# Patient Record
Sex: Female | Born: 1950
Health system: Southern US, Community
[De-identification: ages and names within clinical notes are randomized; demographics above are authoritative.]

## PROBLEM LIST (undated history)

## (undated) DIAGNOSIS — C44721 Squamous cell carcinoma of skin of unspecified lower limb, including hip: Secondary | ICD-10-CM

## (undated) DIAGNOSIS — M25512 Pain in left shoulder: Secondary | ICD-10-CM

## (undated) DIAGNOSIS — M25511 Pain in right shoulder: Secondary | ICD-10-CM

## (undated) DIAGNOSIS — R0789 Other chest pain: Secondary | ICD-10-CM

## (undated) DIAGNOSIS — F419 Anxiety disorder, unspecified: Secondary | ICD-10-CM

## (undated) DIAGNOSIS — E785 Hyperlipidemia, unspecified: Secondary | ICD-10-CM

## (undated) DIAGNOSIS — R739 Hyperglycemia, unspecified: Secondary | ICD-10-CM

## (undated) DIAGNOSIS — Z0000 Encounter for general adult medical examination without abnormal findings: Secondary | ICD-10-CM

## (undated) DIAGNOSIS — F329 Major depressive disorder, single episode, unspecified: Secondary | ICD-10-CM

## (undated) DIAGNOSIS — F32A Depression, unspecified: Secondary | ICD-10-CM

## (undated) DIAGNOSIS — F418 Other specified anxiety disorders: Secondary | ICD-10-CM

## (undated) DIAGNOSIS — M8580 Other specified disorders of bone density and structure, unspecified site: Secondary | ICD-10-CM

## (undated) DIAGNOSIS — N649 Disorder of breast, unspecified: Secondary | ICD-10-CM

## (undated) HISTORY — DX: Depression, unspecified: F32.A

## (undated) HISTORY — PX: CHOLECYSTECTOMY: SHX55

## (undated) HISTORY — DX: Other chest pain: R07.89

## (undated) HISTORY — DX: Pain in left shoulder: M25.512

## (undated) HISTORY — DX: Encounter for general adult medical examination without abnormal findings: Z00.00

## (undated) HISTORY — DX: Disorder of breast, unspecified: N64.9

## (undated) HISTORY — DX: Squamous cell carcinoma of skin of unspecified lower limb, including hip: C44.721

## (undated) HISTORY — DX: Anxiety disorder, unspecified: F41.9

## (undated) HISTORY — PX: BREAST BIOPSY: SHX20

## (undated) HISTORY — DX: Hyperglycemia, unspecified: R73.9

## (undated) HISTORY — DX: Pain in right shoulder: M25.511

## (undated) HISTORY — DX: Other specified disorders of bone density and structure, unspecified site: M85.80

## (undated) HISTORY — DX: Hyperlipidemia, unspecified: E78.5

## (undated) HISTORY — DX: Other specified anxiety disorders: F41.8

## (undated) HISTORY — PX: TUBAL LIGATION: SHX77

## (undated) HISTORY — DX: Major depressive disorder, single episode, unspecified: F32.9

---

## 2010-05-09 ENCOUNTER — Encounter: Payer: Self-pay | Admitting: Family Medicine

## 2010-05-17 ENCOUNTER — Encounter (INDEPENDENT_AMBULATORY_CARE_PROVIDER_SITE_OTHER): Payer: Self-pay | Admitting: *Deleted

## 2010-05-30 ENCOUNTER — Encounter (INDEPENDENT_AMBULATORY_CARE_PROVIDER_SITE_OTHER): Payer: Self-pay | Admitting: *Deleted

## 2010-06-03 ENCOUNTER — Ambulatory Visit: Payer: Self-pay | Admitting: Internal Medicine

## 2010-06-11 ENCOUNTER — Ambulatory Visit: Payer: Self-pay | Admitting: Internal Medicine

## 2010-06-18 ENCOUNTER — Ambulatory Visit: Payer: Self-pay | Admitting: Family Medicine

## 2010-06-18 DIAGNOSIS — K5730 Diverticulosis of large intestine without perforation or abscess without bleeding: Secondary | ICD-10-CM | POA: Insufficient documentation

## 2010-06-18 DIAGNOSIS — N649 Disorder of breast, unspecified: Secondary | ICD-10-CM

## 2010-06-18 DIAGNOSIS — G4700 Insomnia, unspecified: Secondary | ICD-10-CM | POA: Insufficient documentation

## 2010-06-18 DIAGNOSIS — K219 Gastro-esophageal reflux disease without esophagitis: Secondary | ICD-10-CM | POA: Insufficient documentation

## 2010-06-18 HISTORY — DX: Disorder of breast, unspecified: N64.9

## 2010-06-19 LAB — CONVERTED CEMR LAB
ALT: 42 units/L — ABNORMAL HIGH (ref 0–35)
Albumin: 4.7 g/dL (ref 3.5–5.2)
BUN: 12 mg/dL (ref 6–23)
Basophils Relative: 0.8 % (ref 0.0–3.0)
Bilirubin, Direct: 0.1 mg/dL (ref 0.0–0.3)
Creatinine, Ser: 0.8 mg/dL (ref 0.4–1.2)
Direct LDL: 214.3 mg/dL
Eosinophils Relative: 1.1 % (ref 0.0–5.0)
Glucose, Bld: 109 mg/dL — ABNORMAL HIGH (ref 70–99)
HCT: 42.7 % (ref 36.0–46.0)
Lymphs Abs: 1.5 10*3/uL (ref 0.7–4.0)
MCV: 87.5 fL (ref 78.0–100.0)
Monocytes Absolute: 0.2 10*3/uL (ref 0.1–1.0)
Phosphorus: 2.6 mg/dL (ref 2.3–4.6)
Potassium: 4.6 meq/L (ref 3.5–5.1)
RBC: 4.88 M/uL (ref 3.87–5.11)
Total Protein: 7.4 g/dL (ref 6.0–8.3)
WBC: 4.4 10*3/uL — ABNORMAL LOW (ref 4.5–10.5)

## 2010-08-19 ENCOUNTER — Ambulatory Visit: Payer: Self-pay | Admitting: Family Medicine

## 2010-08-20 LAB — CONVERTED CEMR LAB
ALT: 30 units/L (ref 0–35)
Albumin: 4.4 g/dL (ref 3.5–5.2)
Total Bilirubin: 0.6 mg/dL (ref 0.3–1.2)

## 2010-09-25 ENCOUNTER — Telehealth: Payer: Self-pay | Admitting: Family Medicine

## 2010-10-02 ENCOUNTER — Ambulatory Visit: Payer: Self-pay | Admitting: Family Medicine

## 2010-10-03 LAB — CONVERTED CEMR LAB
ALT: 26 units/L (ref 0–35)
AST: 24 units/L (ref 0–37)
Albumin: 4.1 g/dL (ref 3.5–5.2)
BUN: 17 mg/dL (ref 6–23)
Basophils Relative: 0.5 % (ref 0.0–3.0)
CO2: 32 meq/L (ref 19–32)
Cholesterol: 222 mg/dL — ABNORMAL HIGH (ref 0–200)
Creatinine, Ser: 0.7 mg/dL (ref 0.4–1.2)
Direct LDL: 150.8 mg/dL
Eosinophils Relative: 2 % (ref 0.0–5.0)
GFR calc non Af Amer: 89.14 mL/min (ref >60.00)
Lymphocytes Relative: 35.5 % (ref 12.0–46.0)
MCV: 87.2 fL (ref 78.0–100.0)
Monocytes Relative: 6.5 % (ref 3.0–12.0)
Neutrophils Relative %: 55.5 % (ref 43.0–77.0)
Platelets: 201 10*3/uL (ref 150.0–400.0)
RBC: 4.74 M/uL (ref 3.87–5.11)
Total CHOL/HDL Ratio: 5
Total Protein: 6.7 g/dL (ref 6.0–8.3)
Triglycerides: 123 mg/dL (ref 0.0–149.0)
WBC: 4.6 10*3/uL (ref 4.5–10.5)

## 2010-10-09 ENCOUNTER — Ambulatory Visit: Payer: Self-pay | Admitting: Family Medicine

## 2010-10-09 DIAGNOSIS — R030 Elevated blood-pressure reading, without diagnosis of hypertension: Secondary | ICD-10-CM | POA: Insufficient documentation

## 2010-10-09 DIAGNOSIS — E782 Mixed hyperlipidemia: Secondary | ICD-10-CM | POA: Insufficient documentation

## 2010-11-17 ENCOUNTER — Encounter: Payer: Self-pay | Admitting: Family Medicine

## 2010-11-22 ENCOUNTER — Encounter: Payer: Self-pay | Admitting: Family Medicine

## 2010-11-26 NOTE — Procedures (Signed)
Summary: Colonoscopy  Patient: [name] Note: All result statuses are Final unless otherwise noted.  Tests: (1) Colonoscopy (COL)   COL Colonoscopy           DONE     [facility]     [address]     [place], [place]  [address]           COLONOSCOPY PROCEDURE REPORT           PATIENT:  Tricia Fleming, Tricia Fleming  MR#:  [id number]     BIRTHDATE:  27-Jul-1951, 59 yrs. old  GENDER:  female     ENDOSCOPIST:  Hedwig Morton. Juanda Chance, MD     REF. BY:     PROCEDURE DATE:  06/11/2010     PROCEDURE:  Colonoscopy [address]     ASA CLASS:  Class I     INDICATIONS:  Routine Risk Screening     MEDICATIONS:   Versed 7 mg, Fentanyl 75 mcg           DESCRIPTION OF PROCEDURE:   After the risks benefits and     alternatives of the procedure were thoroughly explained, informed     consent was obtained.  Digital rectal exam was performed and     revealed no rectal masses.   The LB [id number]-[id number] endoscope     was introduced through the anus and advanced to the cecum, which     was identified by both the appendix and ileocecal valve, without     limitations.  The quality of the prep was excellent, using     MoviPrep.  The instrument was then slowly withdrawn as the colon     was fully examined.     <<PROCEDUREIMAGES>>           FINDINGS:  Mild diverticulosis was found (see image1).  This was     otherwise a normal examination of the colon (see image2, image3,     image4, and image5).   Retroflexed views in the rectum revealed no     abnormalities.    The scope was then withdrawn from the patient     and the procedure completed.           COMPLICATIONS:  None     ENDOSCOPIC IMPRESSION:     1) Mild diverticulosis     2) Otherwise normal examination     RECOMMENDATIONS:     1) high fiber diet     REPEAT EXAM:  In 10 year(s) for.           ______________________________     Hedwig Morton. Juanda Chance, MD           CC:           n.     eSIGNED:   Hedwig Morton. Brodie at 06/11/2010 04:18 PM           [name], [phone]  Note: An exclamation mark (!) indicates a result that was not dispersed into the flowsheet. Document Creation Date: 06/11/2010 4:20 PM _______________________________________________________________________  (1) Order result status: Final Collection or observation date-time: 06/11/2010 16:10 Requested date-time:  Receipt date-time:  Reported date-time:  Referring Physician:   Ordering Physician: Lina Sar [phone]) Specimen Source:  Source: Launa Grill Order Number: [id number] Lab site:   Appended Document: Colonoscopy    Clinical Lists Changes  Observations: Added new observation of COLONNXTDUE: 05/2020 (06/11/2010 16:25)

## 2010-11-26 NOTE — Assessment & Plan Note (Signed)
Summary: NEW PT EST (PT REQ CPX - PT WILL COME IN FASTING) // RS   Vital Signs:  Patient profile:   60 year old female Height:      61.5 inches (156.21 cm) Weight:      152 pounds (69.09 kg) BMI:     28.36 O2 Sat:      93 % on Room air Temp:     98.4 degrees F (36.89 degrees C) oral Pulse rate:   86 / minute BP sitting:   140 / 84  (left arm) Cuff size:   regular  Vitals Entered By: Josph Macho RMA (June 18, 2010 8:14 AM)  O2 Flow:  Room air CC: Establish new pt/ CF Is Patient Diabetic? No   History of Present Illness: Patient in to establish care today doing well. Her only c/o is some occasional trouble sleeping. Usually she has trouble quieting down and resting down. Also notes infrequent tension HA which responds to OTC meds as needed. She notes occasional heartburn as well which responds well to [employer] and/or Tums. No recent illnessf/c/CP/palp/SOB/GI or GU c/o. She notes her right color bone is protruding more than her left and she noticed it a couple months ago. Nontender and she denies any trauma. Notes a light brown patch on her right arm which is not pruritic or changing rapidly but is slightly scaly at times  Preventive Screening-Counseling & Management  Alcohol-Tobacco     Smoking Status: never  Caffeine-Diet-Exercise     Does Patient Exercise: no      Drug Use:  no.    Current Medications (verified): 1)  Alprazolam 0.5 Mg Tabs (Alprazolam) .... Once Daily  Allergies (verified): No Known Drug Allergies  Past History:  Past Surgical History: Denies surgical history  Family History: Father: deceased@72 , metastatic Prostate Cancer Mother: deceased@70 , HTN, DM, CAD Siblings:  Sister: 73, TIA Brother: 23, BPH, glucose intolerance, heart disease Brother: 56, BPH Sister: 30, HTN Sister: 39, OA Brother: 63, A&W Sister: 81, A&W Brother: 81, esophageal varices, cirrhosis, ETOH abuse MGM: deceased in 87s or 41s, DM, CAD MGF: deceased in 6s,  DM PGM: deceased in 46s, old age PGF: deceased in 58s, old age Children: Daughter: 8, A&W Daughter: 47, A&W Daughter:30, A&W Son: 14, A&W  Social History: 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 Smoked Alcohol use-yes, occasional Drug use-no Regular exercise-no uses seat belt regularlyOccupation:  employed Smoking Status:  never Drug Use:  no Does Patient Exercise:  no  Review of Systems  The patient denies anorexia, fever, weight loss, weight gain, vision loss, decreased hearing, hoarseness, chest pain, syncope, dyspnea on exertion, peripheral edema, prolonged cough, headaches, hemoptysis, abdominal pain, melena, hematochezia, severe indigestion/heartburn, hematuria, incontinence, muscle weakness, suspicious skin lesions, transient blindness, difficulty walking, depression, unusual weight change, abnormal bleeding, and enlarged lymph nodes.    Physical Exam  General:  Well-developed,well-nourished,in no acute distress; alert,appropriate and cooperative throughout examination Head:  Normocephalic and atraumatic without obvious abnormalities. No apparent alopecia or balding. Eyes:  No corneal or conjunctival inflammation noted. EOMI.  Ears:  External ear exam shows no significant lesions or deformities.  Otoscopic examination reveals clear canals, tympanic membranes are intact bilaterally without bulging, retraction, inflammation or discharge. Hearing is grossly normal bilaterally. Nose:  External nasal examination shows no deformity or inflammation. Nasal mucosa are pink and moist without lesions or exudates. Mouth:  Oral mucosa and oropharynx without lesions or exudates.  Teeth in good repair. Neck:  No deformities, masses, or tenderness noted. Chest Wall:  Right clavicle protrudes out compared to left Lungs:  Normal respiratory effort, chest expands symmetrically. Lungs are clear to auscultation, no crackles or wheezes. Heart:  Normal rate and regular rhythm. S1 and S2 normal  without gallop, murmur, click, rub or other extra sounds. Abdomen:  Bowel sounds positive,abdomen soft and non-tender without masses, organomegaly or hernias noted. Msk:  No deformity or scoliosis noted of thoracic or lumbar spine.   Pulses:  R and L, dorsalis pedis and posterior tibial pulses are full and equal bilaterally Extremities:  No clubbing, cyanosis, edema, or deformity noted  Neurologic:  No cranial nerve deficits noted. Station and gait are normal. Plantar reflexes are down-going bilaterally. DTRs are symmetrical throughout. Sensory, motor and coordinative functions appear intact. Skin:  Intact without suspicious lesions or rashes. Small 1 cm seborrhaic lesion, slightly waxy and raisted, light brown color on upper right arm and many scattered pale brown freckles throughout Cervical Nodes:  No lymphadenopathy noted Psych:  Cognition and judgment appear intact. Alert and cooperative with normal attention span and concentration. No apparent delusions, illusions, hallucinations   Impression & Recommendations:  Problem # 1:  Preventive Health Care (ICD-V70.0)  Orders: TLB-Renal Function Panel (80069-RENAL) TLB-Lipid Panel (80061-LIPID) TLB-CBC Platelet - w/Differential (85025-CBCD) TLB-Hepatic/Liver Function Pnl (80076-HEPATIC) TLB-TSH (Thyroid Stimulating Hormone) (84443-TSH) Venipuncture (54098) Specimen Handling (11914) Sees GYN and brings in her recent MGM results reports her paps are all normal Will return annually or as needed given Tdap today  Problem # 2:  DIVERTICULOSIS, MILD (ICD-562.10) Encouraged hi fiber diet and increased exercise or hydration, consider Benefiber or Metamucil daily and a probiotic  Problem # 3:  SWELLING, MASS, OR LUMP IN CHEST (ICD-786.6)  Orders: T-2 View CXR (71020TC) Exam only notable for mild protrusion of right clavicle, await xray results  Problem # 4:  INSOMNIA UNSPECIFIED (ICD-780.52) Discussed sleep hygiene and may try Benadryl as  needed   Complete Medication List: 1)  Alprazolam 0.5 Mg Tabs (Alprazolam) .... Once daily  Other Orders: Tdap => 16yrs IM ([address]) Admin 1st Vaccine (62130)  Patient Instructions: 1)  Please schedule a follow-up appointment in 1 year for annual exam 2)  BMP prior to visit, ICD-9: all v70.0 3)  Hepatic Panel prior to visit ICD-9:  4)  Lipid panel prior to visit ICD-9 :  5)  TSH prior to visit ICD-9 :  6)  CBC w/ Diff prior to visit ICD-9 :  7)  Use Aleve instead of Advil/Motrin (generic name is Ibuprofen)  Preventive Care Screening  Colonoscopy:    Date:  05/27/2010    Results:  historical   Hemoccult:    Date:  04/26/2010    Results:  historical   Mammogram:    Date:  04/26/2010    Results:  historical   Pap Smear:    Date:  04/26/2010    Results:  historical     Immunizations Administered:  Tetanus Vaccine:    Vaccine Type: Tdap    Site: left deltoid    Mfr: GlaxoSmithKline    Dose: 0.5 ml    Route: IM    Given by: Josph Macho RMA    Exp. Date: 08/15/2012    Lot #: [id number]    VIS given: 09/14/07 version given June 18, 2010.

## 2010-11-26 NOTE — Letter (Signed)
Summary: Previsit letter  [facility]  [address] [place], [place] [address]   Phone: [phone]  Fax: [phone]       05/17/2010 MRN: [id number]  [facility] [address] [place], [place]  [address]  Dear Tricia Fleming,  Welcome to the Gastroenterology Division at [facility].    You are scheduled to see a nurse for your pre-procedure visit on 06/03/2010 at 2:30PM on the 3rd floor at [facility], [address]  We ask that you try to arrive at our office 15 minutes prior to your appointment time to allow for check-in.  Your nurse visit will consist of discussing your medical and surgical history, your immediate family medical history, and your medications.    Please bring a complete list of all your medications or, if you prefer, bring the medication bottles and we will list them.  We will need to be aware of both prescribed and over the counter drugs.  We will need to know exact dosage information as well.  If you are on blood thinners (Coumadin, Plavix, Aggrenox, Ticlid, etc.) please call our office today/prior to your appointment, as we need to consult with your physician about holding your medication.   Please be prepared to read and sign documents such as consent forms, a financial agreement, and acknowledgement forms.  If necessary, and with your consent, a friend or relative is welcome to sit-in on the nurse visit with you.  Please bring your insurance card so that we may make a copy of it.  If your insurance requires a referral to see a specialist, please bring your referral form from your primary care physician.  No co-pay is required for this nurse visit.     If you cannot keep your appointment, please call [phone] to cancel or reschedule prior to your appointment date.  This allows [place] the opportunity to schedule an appointment for another patient in need of care.    Thank you for choosing [facility] for your medical needs.   We appreciate the opportunity to care for you.  Please visit [place] at our website  to learn more about our practice.                     Sincerely.                                                                                                                   The Gastroenterology Division

## 2010-11-26 NOTE — Letter (Signed)
Summary: Mammogram Results Letter  Mammogram Results Letter   Imported By: Maryln Gottron 06/26/2010 14:27:28  _____________________________________________________________________  External Attachment:    Type:   Image     Comment:   External Document

## 2010-11-26 NOTE — Miscellaneous (Signed)
Summary: previsit prep/Rm  Clinical Lists Changes  Medications: Added new medication of MOVIPREP 100 GM  SOLR (PEG-KCL-NACL-NASULF-NA ASC-C) As per prep instructions. - Signed Rx of MOVIPREP 100 GM  SOLR (PEG-KCL-NACL-NASULF-NA ASC-C) As per prep instructions.;  #1 x 0;  Signed;  Entered by: Sherren Kerns RN;  Authorized by: Hart Carwin MD;  Method used: Electronically to [facility]  [phone] [address] [place], [place], [place]  [address], Ph: [phone] or [phone], Fax: [phone] Observations: Added new observation of ALLERGY REV: Done (06/03/2010 14:38) Added new observation of NKA: T (06/03/2010 14:38)    Prescriptions: MOVIPREP 100 GM  SOLR (PEG-KCL-NACL-NASULF-NA ASC-C) As per prep instructions.  #1 x 0   Entered by:   Sherren Kerns RN   Authorized by:   Hart Carwin MD   Signed by:   Sherren Kerns RN on 06/03/2010   Method used:   [facility] to        [facility]  [id number] ([facility]ail)       [address]       [place], [place]  [address]       Ph: [phone] or [phone]       Fax: [phone]   RxID:   [id number]

## 2010-11-26 NOTE — Progress Notes (Signed)
Summary: Scheduling Pt  Phone Note Call from Patient   Caller: Patient Summary of Call: Patient called needs a follow up appt with me in December, needs labs prior to visit. Fasting FLP, liver, renal, CBC she would like to do the labs at [facility] the week prior to the visit. Cell [phone] Initial call taken by: Danise Edge MD,  September 25, 2010 1:14 PM  Follow-up for Phone Call        pt will come on 10-02-2010 per Diane. Follow-up by: Warnell Forester,  September 25, 2010 1:38 PM  Additional Follow-up for Phone Call Additional follow up Details #1::        pt scheduled with Dr Abner Greenspan 10/09/10 Diane Tomerlin  September 25, 2010 1:38 PM

## 2010-11-26 NOTE — Letter (Signed)
Summary: [facility] Instructions  [facility]  [address] [place], [place] [address]   Phone: [phone]  Fax: [phone]       Tricia Fleming    06-28-1951    MRN: [id number]        Procedure Day /Date:  Tuesday 06/11/2010     Arrival Time: 2:30 pm      Procedure Time: 3:30 pm     Location of Procedure:                    _x _  [facility] (4th Floor)                        PREPARATION FOR COLONOSCOPY WITH MOVIPREP   Starting 5 days prior to your procedure Thursday 8/11 do not eat nuts, seeds, popcorn, corn, beans, peas,  salads, or any raw vegetables.  Do not take any fiber supplements (e.g. Metamucil, Citrucel, and Benefiber).  THE DAY BEFORE YOUR PROCEDURE         DATE: Monday 8/15  1.  Drink clear liquids the entire day-NO SOLID FOOD  2.  Do not drink anything colored red or purple.  Avoid juices with pulp.  No orange juice.  3.  Drink at least 64 oz. (8 glasses) of fluid/clear liquids during the day to prevent dehydration and help the prep work efficiently.  CLEAR LIQUIDS INCLUDE: Water Jello Ice Popsicles Tea (sugar ok, no milk/cream) Powdered fruit flavored drinks Coffee (sugar ok, no milk/cream) Gatorade Juice: apple, white grape, white cranberry  Lemonade Clear bullion, consomm, broth Carbonated beverages (any kind) Strained chicken noodle soup Hard Candy                             4.  In the morning, mix first dose of MoviPrep solution:    Empty 1 Pouch A and 1 Pouch B into the disposable container    Add lukewarm drinking water to the top line of the container. Mix to dissolve    Refrigerate (mixed solution should be used within 24 hrs)  5.  Begin drinking the prep at 5:00 p.m. The MoviPrep container is divided by 4 marks.   Every 15 minutes drink the solution down to the next mark (approximately 8 oz) until the full liter is complete.   6.  Follow completed prep with 16 oz of clear liquid of your choice (Nothing  red or purple).  Continue to drink clear liquids until bedtime.  7.  Before going to bed, mix second dose of MoviPrep solution:    Empty 1 Pouch A and 1 Pouch B into the disposable container    Add lukewarm drinking water to the top line of the container. Mix to dissolve    Refrigerate  THE DAY OF YOUR PROCEDURE      DATE: Tuesday 8/16  Beginning at 10:30 a.m. (5 hours before procedure):         1. Every 15 minutes, drink the solution down to the next mark (approx 8 oz) until the full liter is complete.  2. Follow completed prep with 16 oz. of clear liquid of your choice.    3. You may drink clear liquids until 1:30 pm (2 HOURS BEFORE PROCEDURE).   MEDICATION INSTRUCTIONS  Unless otherwise instructed, you should take regular prescription medications with a small sip of water   as early as possible the morning of  your procedure.   Additional medication instructions: n/a         OTHER INSTRUCTIONS  You will need a responsible adult at least 61 years of age to accompany you and drive you home.   This person must remain in the waiting room during your procedure.  Wear loose fitting clothing that is easily removed.  Leave jewelry and other valuables at home.  However, you may wish to bring a book to read or  an iPod/MP3 player to listen to music as you wait for your procedure to start.  Remove all body piercing jewelry and leave at home.  Total time from sign-in until discharge is approximately 2-3 hours.  You should go home directly after your procedure and rest.  You can resume normal activities the  day after your procedure.  The day of your procedure you should not:   Drive   Make legal decisions   Operate machinery   Drink alcohol   Return to work  You will receive specific instructions about eating, activities and medications before you leave.    The above instructions have been reviewed and explained to me by  Sherren Kerns RN  June 03, 2010 2:58  PM    I fully understand and can verbalize these instructions _____________________________ Date _________

## 2010-11-28 NOTE — Assessment & Plan Note (Signed)
Summary: fu visit per Dr Zsofia Prout/dt   Vital Signs:  Patient profile:   60 year old female Height:      61.5 inches (156.21 cm) Weight:      152.50 pounds (69.32 kg) O2 Sat:      96 % on Room air Temp:     98.1 degrees F (36.72 degrees C) oral Pulse rate:   97 / minute BP sitting:   143 / 79  (right arm) Cuff size:   regular  Vitals Entered By: Josph Macho RMA (October 09, 2010 8:03 AM)  O2 Flow:  Room air CC: Follow-up visit/ CF Is Patient Diabetic? No   History of Present Illness: 60 yo Caucasian female in today for follow up on multiple medical problems. She has generally been feeling well. She does note occasional heartburn and will take an over-the-counter H2 blocker with good effect. Does not acknowledge any specific pattern of the heartburn on her overeating. It is not severe and she feels she can generally controlled well at present. She denies any chest pain, palpitations, shortness of breath, fevers, chills, recent illness, GI or GU complaints otherwise. She continues to struggle with difficulty sleeping at times has tried Benadryl without effect has used alprazolam which does help to a degree but she tries not to use it regularly. She occasionally has trouble falling asleep but more often awakens in the night and has trouble falling back to sleep. She believes on most night she gets close to 7 hours. No other concerns noted at today's visit. She is tolerating lovastatin denies any myalgias or fatigue  Current Medications (verified): 1)  Alprazolam 0.5 Mg Tabs (Alprazolam) .... Once Daily 2)  Lovastatin 10 Mg Tabs (Lovastatin) .Marland Kitchen.. 1 Daily  Allergies (verified): No Known Drug Allergies  Past History:  Past medical history reviewed for relevance to current acute and chronic problems. Social history (including risk factors) reviewed for relevance to current acute and chronic problems.  Social History: Reviewed history from 06/18/2010 and no changes  required. 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 No apparent alopecia or balding. Mouth:  Oral mucosa and oropharynx without lesions or exudates.  Teeth in good repair. Neck:  No deformities, masses, or tenderness noted. Lungs:  Normal respiratory effort, chest expands symmetrically. Lungs are clear to auscultation, no crackles or wheezes. Heart:  Normal rate and regular rhythm. S1 and S2 normal without gallop, murmur, click, rub or other extra sounds. Abdomen:  Bowel sounds positive,abdomen soft and non-tender without masses, organomegaly or hernias noted. Extremities:  No clubbing, cyanosis, edema, or deformity noted with normal full range of motion of all joints.   Psych:  Cognition and judgment appear intact. Alert and cooperative with normal attention span and concentration. No apparent delusions, illusions, hallucinations   Impression & Recommendations:  Problem # 1:  ELEVATED BP READING WITHOUT DX HYPERTENSION (ICD-796.2) Improved on repat check, is encouraged to avoid sodium, minimize caffeine, start exercising and get 7-8 hours of sleep.  Problem # 2:  MIXED HYPERLIPIDEMIA (ICD-272.2)  The following medications were removed from the medication list:    Lovastatin 10 Mg Tabs (Lovastatin) .Marland Kitchen... 1 daily Her updated medication list for this problem includes:    Lovastatin 20 Mg Tabs (Lovastatin) .Marland KitchenMarland KitchenMarland KitchenMarland Kitchen 1  tab by mouth at bedtime Patient is noncommital about increasing to 20mg  agrees to take the prescription and let us know if she went up so we can check her LFTs in a month if need be. Agrees to try and minimize trans fats and start fish oil  supplements  Problem # 3:  INSOMNIA UNSPECIFIED (ICD-780.52)  Her updated medication list for this problem includes:    Zolpidem Tartrate 10 Mg Tabs (Zolpidem tartrate) .Marland Kitchen... 1/2 to 1 tab by mouth at bedtime as needed insomnia May use the Zolpidem intermittently and alternate as needed with Alprazolam but do not take together  Problem # 4:  GERD (ICD-530.81) Avoid offending foods, may use OTC meds and call if symptoms worsen.  Complete Medication List: 1)  Alprazolam 0.5 Mg Tabs (Alprazolam) .... Once daily 2)  Lovastatin 20 Mg Tabs (Lovastatin) .Marland Kitchen.. 1 tab by mouth at bedtime 3)  Zolpidem Tartrate 10 Mg Tabs (Zolpidem tartrate) .... 1/2 to 1 tab by mouth at bedtime as needed insomnia 4)  Fish Oil 1000 Mg Caps (Omega-3 fatty acids) .... 2 caps by mouth daily  Patient Instructions: 1)  Please schedule a follow-up appointment in 6 months .  2)  BMP prior to visit, ICD-9: 796.2 3)  Hepatic Panel prior to visit ICD-9: 796.2 4)  Lipid panel prior to visit ICD-9 : 272.4 5)  TSH prior to visit ICD-9 : 272.4 6)  CBC w/ Diff prior to visit ICD-9 : 796.2 7)  Avoid sodium, minimize caffeine, increase exercise and get 7-8 hours of sleep. 8)  Call in a month to let us know where and if you need to get the liver panel blood work done. 9)  Enjoy Prescriptions: FISH OIL 1000 MG CAPS (OMEGA-3 FATTY ACIDS) 2 caps by mouth daily  #60 x 5   Entered and Authorized by:   Danise Edge MD   Signed by:   Danise Edge MD on 10/09/2010   Method used:   Print then Give to Patient   RxID:   [id number] ZOLPIDEM TARTRATE 10 MG TABS (ZOLPIDEM TARTRATE) 1/2 to 1 tab by mouth at bedtime as needed insomnia  #30 x 0   Entered and Authorized by:   Danise Edge MD   Signed by:   Danise Edge MD on 10/09/2010   Method used:   Print then Give to Patient   RxID:   [id number] LOVASTATIN 20 MG TABS (LOVASTATIN) 1 tab by mouth at bedtime  #90 x 3   Entered and Authorized by:   Danise Edge MD   Signed by:    Danise Edge MD on 10/09/2010   Method used:   Electronically to        [facility]* (mail-order)       [address] [place], [place]  [address]       Ph: [phone]       Fax: [phone]   RxID:   [id number]    Orders Added: 1)  Est. Patient Level IV [16606]

## 2011-01-15 ENCOUNTER — Other Ambulatory Visit: Payer: Self-pay | Admitting: Family Medicine

## 2011-01-15 MED ORDER — ZOLPIDEM TARTRATE 10 MG PO TABS: 10.0000 mg | ORAL_TABLET | Freq: Every evening | ORAL | Status: DC | PRN

## 2011-01-15 NOTE — Telephone Encounter (Signed)
RX sent

## 2011-01-15 NOTE — Telephone Encounter (Signed)
Fax received from [facility].

## 2011-06-26 ENCOUNTER — Ambulatory Visit (INDEPENDENT_AMBULATORY_CARE_PROVIDER_SITE_OTHER): Payer: BC Managed Care – PPO | Admitting: Family Medicine

## 2011-06-26 ENCOUNTER — Other Ambulatory Visit (HOSPITAL_COMMUNITY)
Admission: RE | Admit: 2011-06-26 | Discharge: 2011-06-26 | Disposition: A | Discharge status: Home / Self Care | Payer: BC Managed Care – PPO | Source: Ambulatory Visit | Attending: Family Medicine | Admitting: Family Medicine

## 2011-06-26 ENCOUNTER — Encounter: Payer: Self-pay | Admitting: Family Medicine

## 2011-06-26 VITALS — BP 134/79 | HR 86 | Temp 98.3°F | Ht 61.5 in | Wt 150.4 lb

## 2011-06-26 DIAGNOSIS — K219 Gastro-esophageal reflux disease without esophagitis: Secondary | ICD-10-CM

## 2011-06-26 DIAGNOSIS — G4700 Insomnia, unspecified: Secondary | ICD-10-CM

## 2011-06-26 DIAGNOSIS — Z01419 Encounter for gynecological examination (general) (routine) without abnormal findings: Secondary | ICD-10-CM | POA: Insufficient documentation

## 2011-06-26 DIAGNOSIS — E785 Hyperlipidemia, unspecified: Secondary | ICD-10-CM

## 2011-06-26 DIAGNOSIS — F341 Dysthymic disorder: Secondary | ICD-10-CM

## 2011-06-26 DIAGNOSIS — R030 Elevated blood-pressure reading, without diagnosis of hypertension: Secondary | ICD-10-CM

## 2011-06-26 DIAGNOSIS — R5381 Other malaise: Secondary | ICD-10-CM

## 2011-06-26 DIAGNOSIS — F418 Other specified anxiety disorders: Secondary | ICD-10-CM

## 2011-06-26 DIAGNOSIS — Z0000 Encounter for general adult medical examination without abnormal findings: Secondary | ICD-10-CM

## 2011-06-26 DIAGNOSIS — E782 Mixed hyperlipidemia: Secondary | ICD-10-CM

## 2011-06-26 DIAGNOSIS — R5383 Other fatigue: Secondary | ICD-10-CM

## 2011-06-26 LAB — HEPATIC FUNCTION PANEL
ALT: 23 U/L (ref 0–35)
AST: 23 U/L (ref 0–37)
Total Bilirubin: 0.5 mg/dL (ref 0.3–1.2)

## 2011-06-26 LAB — RENAL FUNCTION PANEL
Albumin: 4.7 g/dL (ref 3.5–5.2)
CO2: 29 mEq/L (ref 19–32)
Calcium: 9.3 mg/dL (ref 8.4–10.5)
Creatinine, Ser: 0.8 mg/dL (ref 0.4–1.2)
Sodium: 143 mEq/L (ref 135–145)

## 2011-06-26 LAB — CBC WITH DIFFERENTIAL/PLATELET
Basophils Absolute: 0 10*3/uL (ref 0.0–0.1)
Basophils Relative: 0.4 % (ref 0.0–3.0)
Eosinophils Absolute: 0 10*3/uL (ref 0.0–0.7)
Eosinophils Relative: 0.9 % (ref 0.0–5.0)
HCT: 42.6 % (ref 36.0–46.0)
Hemoglobin: 14.3 g/dL (ref 12.0–15.0)
Lymphocytes Relative: 19.8 % (ref 12.0–46.0)
Lymphs Abs: 1.1 10*3/uL (ref 0.7–4.0)
MCHC: 33.6 g/dL (ref 30.0–36.0)
MCV: 87.8 fl (ref 78.0–100.0)
Monocytes Absolute: 0.2 10*3/uL (ref 0.1–1.0)
Monocytes Relative: 3.4 % (ref 3.0–12.0)
Neutro Abs: 4.1 10*3/uL (ref 1.4–7.7)
Neutrophils Relative %: 75.5 % (ref 43.0–77.0)
Platelets: 202 10*3/uL (ref 150.0–400.0)
RBC: 4.86 Mil/uL (ref 3.87–5.11)
RDW: 14 % (ref 11.5–14.6)
WBC: 5.5 10*3/uL (ref 4.5–10.5)

## 2011-06-26 LAB — LIPID PANEL
Cholesterol: 232 mg/dL — ABNORMAL HIGH (ref 0–200)
Total CHOL/HDL Ratio: 4

## 2011-06-26 LAB — TSH: TSH: 0.46 u[IU]/mL (ref 0.35–5.50)

## 2011-06-26 LAB — LDL CHOLESTEROL, DIRECT: Direct LDL: 156.9 mg/dL

## 2011-06-26 MED ORDER — ZOLPIDEM TARTRATE 10 MG PO TABS: 10.0000 mg | ORAL_TABLET | Freq: Every evening | ORAL | Status: DC | PRN

## 2011-06-26 MED ORDER — ALPRAZOLAM 0.5 MG PO TABS: 0.5000 mg | ORAL_TABLET | Freq: Two times a day (BID) | ORAL | Status: DC | PRN

## 2011-06-26 MED ORDER — SERTRALINE HCL 50 MG PO TABS: ORAL_TABLET | ORAL | Status: DC

## 2011-06-26 NOTE — Assessment & Plan Note (Addendum)
Notes the Zolpidem is helpful she uses it intermittently, she is given a refill today

## 2011-06-26 NOTE — Patient Instructions (Signed)
Depression You have signs of depression. This is a common problem. It can occur at any age. It is often hard to recognize. People can suffer from depression and still have moments of enjoyment. Depression interferes with your basic ability to function in life. It upsets your relationships, sleep, eating, and work habits. CAUSES Depression is believed to be caused by an imbalance in brain chemicals. It may be triggered by an unpleasant event. Relationship crises, a death in the family, financial worries, retirement, or other stressors are normal causes of depression. Depression may also start for no known reason. Other factors that may play a part include medical illnesses, some medicines, genetics, and alcohol or drug abuse. SYMPTOMS  Feeling unhappy or worthless.   Long-lasting (chronic) tiredness or worn-out feeling.   Self-destructive thoughts and actions.   Not being able to sleep or sleeping too much.   Eating more than usual or not eating at all.   Headaches or feeling anxious.   Trouble concentrating or making decisions.   Unexplained physical problems and substance abuse.  TREATMENT Depression usually gets better with treatment. This can include:  Antidepressant medicines. It can take weeks before the proper dose is achieved and benefits are reached.   Talking with a therapist, clergyperson, counselor, or friend. These people can help you gain insight into your problem and regain control of your life.   Eating a good diet.   Getting regular physical exercise, such as walking for 30 minutes every day.   Not abusing alcohol or drugs.  Treating depression often takes 6 months or longer. This length of treatment is needed to keep symptoms from returning. Call your caregiver and arrange for follow-up care as suggested. SEEK IMMEDIATE MEDICAL CARE IF:  You start to have thoughts of hurting yourself or others.   Call your local emergency services (911 in [place]   Go to your  local medical emergency department.   Call the National Suicide Prevention Lifeline: 1-800-273-TALK (1-800-273-8255).  Document Released: 10/13/2005 Document Re-Released: 04/02/2010 Exit[employer] Patient Information 2011 [employer]. 

## 2011-06-27 ENCOUNTER — Telehealth: Payer: Self-pay

## 2011-06-27 ENCOUNTER — Encounter: Payer: Self-pay | Admitting: Family Medicine

## 2011-06-27 DIAGNOSIS — F418 Other specified anxiety disorders: Secondary | ICD-10-CM

## 2011-06-27 DIAGNOSIS — Z1211 Encounter for screening for malignant neoplasm of colon: Secondary | ICD-10-CM | POA: Insufficient documentation

## 2011-06-27 DIAGNOSIS — Z0000 Encounter for general adult medical examination without abnormal findings: Secondary | ICD-10-CM

## 2011-06-27 HISTORY — DX: Encounter for general adult medical examination without abnormal findings: Z00.00

## 2011-06-27 HISTORY — DX: Other specified anxiety disorders: F41.8

## 2011-06-27 NOTE — Assessment & Plan Note (Signed)
Acceptable numbers today, will continue to monitor and she should avoid sodium

## 2011-06-27 NOTE — Telephone Encounter (Signed)
Pt informed of lab results. 

## 2011-06-27 NOTE — Assessment & Plan Note (Signed)
Has been missing doses of Mevacor and fish oil. She is asked to redouble her efforts to take them daily and try to avoid trans fats.

## 2011-06-27 NOTE — Assessment & Plan Note (Signed)
Patient notes multiple persistent stressors. Restarted trying an SSRI. Is given sertraline 50 mg tabs to take a half a tab by mouth each bedtime x7 days and then to increase to 1 tab by mouth daily. She is very difficult and concentration, anhedonia and difficulty sleeping.

## 2011-06-27 NOTE — Assessment & Plan Note (Signed)
Pap taken today, MGM ordered, had a colonoscopy last year and wears her seat regularly, fasting labs ordered.

## 2011-06-27 NOTE — Progress Notes (Signed)
Tricia Fleming [id number] [birth year] 06/27/2011      Progress Note New Patient  Subjective  Chief Complaint  Chief Complaint  Patient presents with  . Annual Exam    physical  . Gynecologic Exam    pap    HPI  Patient is a 60 year old Caucasian female who is in today annual exam with Pap. Her OB/GYN history is unremarkable. She is a G4 P4 status post 4 spontaneous vaginal deliveries. Denies any history of abnormal Paps or abnormal mammograms. Menarche was at roughly age 41 menopause roughly 59 and she was regular for most of those years. She denies any GYN complaints such as vaginal discharge or lesions. She has no breast concerns lumps bumps or skin changes noted. She had no recent febrile illness. She denies any chest pain, palpitations, shortness of breath, GI or GU concerns. She does acknowledge feeling excessively tired and anxious. Acknowledges depression is beginning to get the best of her. Her husband has been out of work for 2 years they had to give up their home and move into an apartment her son and daughter are also making good choices and she finds herself when everyone frequently. Struggles with difficulty concentrating and low mood but denies suicidal ideation.  Past Medical History  Diagnosis Date  . Hyperlipidemia   . Anxiety   . Preventative health care 06/27/2011  . Depression with anxiety 06/27/2011    No past surgical history on file.  Family History  Problem Relation Age of Onset  . Cancer Father     metastic prostate  . Diabetes Mother   . Hypertension Mother   . Coronary artery disease Mother   . Transient ischemic attack Sister   . Heart disease Brother   . Benign prostatic hyperplasia Brother   . Insulin resistance Brother   . Hypertension Sister   . Benign prostatic hyperplasia Brother   . Alcohol abuse Brother   . Arthritis Sister   . Esophageal varices Brother   . Cirrhosis Brother   . Diabetes Maternal Grandmother   . Coronary artery disease  Maternal Grandmother   . Diabetes Maternal Grandfather     History   Social History  . Marital Status: Married    Spouse Name: N/A    Number of Children: N/A  . Years of Education: N/A   Occupational History  . Not on file.   Social History Main Topics  . Smoking status: Never Smoker   . Smokeless tobacco: Never Used  . Alcohol Use: 0.0 oz/week    0 drink(s) per week     a couple beers a week  . Drug Use: No  . Sexually Active: Yes -- Female partner(s)   Other Topics Concern  . Not on file   Social History Narrative  . No narrative on file    Current Outpatient Prescriptions on File Prior to Visit  Medication Sig Dispense Refill  . fish oil-omega-3 fatty acids 1000 MG capsule Take 1,000 mg by mouth daily. 2 caps       . lovastatin (MEVACOR) 20 MG tablet Take 20 mg by mouth at bedtime.          No Known Allergies  Review of Systems  Review of Systems  Constitutional: Positive for malaise/fatigue. Negative for fever and chills.  HENT: Negative for hearing loss, nosebleeds and congestion.   Eyes: Negative for discharge.  Respiratory: Negative for cough, sputum production, shortness of breath and wheezing.   Cardiovascular: Negative for chest pain, palpitations and leg  swelling.  Gastrointestinal: Negative for heartburn, nausea, vomiting, abdominal pain, diarrhea, constipation and blood in stool.  Genitourinary: Negative for dysuria, urgency, frequency and hematuria.  Musculoskeletal: Negative for myalgias, back pain and falls.  Skin: Negative for rash.  Neurological: Negative for dizziness, tremors, sensory change, focal weakness, loss of consciousness, weakness and headaches.  Endo/Heme/Allergies: Negative for polydipsia. Does not bruise/bleed easily.  Psychiatric/Behavioral: Positive for depression. Negative for suicidal ideas and substance abuse. The patient is nervous/anxious and has insomnia.     Objective  BP 134/79  Pulse 86  Temp(Src) 98.3 F (36.8 C)  (Oral)  Ht 5' 1.5" (1.562 m)  Wt 150 lb 6.4 oz (68.221 kg)  BMI 27.96 kg/m2  SpO2 99%  Physical Exam  Physical Exam  Constitutional: She is oriented to person, place, and time and well-developed, well-nourished, and in no distress. No distress.  HENT:  Head: Normocephalic and atraumatic.  Right Ear: External ear normal.  Left Ear: External ear normal.  Nose: Nose normal.  Mouth/Throat: Oropharynx is clear and moist. No oropharyngeal exudate.  Eyes: Conjunctivae are normal. Pupils are equal, round, and reactive to light. Right eye exhibits no discharge. Left eye exhibits no discharge. No scleral icterus.  Neck: Normal range of motion. Neck supple. No thyromegaly present.  Cardiovascular: Normal rate, regular rhythm, normal heart sounds and intact distal pulses.   No murmur heard. Pulmonary/Chest: Effort normal and breath sounds normal. No respiratory distress. She has no wheezes. She has no rales.  Abdominal: Soft. Bowel sounds are normal. She exhibits no distension and no mass. There is no tenderness.  Genitourinary: Vagina normal, uterus normal, cervix normal, right adnexa normal and left adnexa normal. No vaginal discharge found.  Musculoskeletal: Normal range of motion. She exhibits no edema and no tenderness.  Lymphadenopathy:    She has no cervical adenopathy.  Neurological: She is alert and oriented to person, place, and time. She has normal reflexes. No cranial nerve deficit. Coordination normal.  Skin: Skin is warm and dry. No rash noted. She is not diaphoretic.  Psychiatric: Mood, memory and affect normal.       Assessment & Plan  INSOMNIA UNSPECIFIED Notes the Zolpidem is helpful she uses it intermittently, she is given a refill today  Preventative health care Pap taken today, MGM ordered, had a colonoscopy last year and wears her seat regularly, fasting labs ordered.  ELEVATED BP READING WITHOUT DX HYPERTENSION Acceptable numbers today, will continue to monitor  and she should avoid sodium  MIXED HYPERLIPIDEMIA Has been missing doses of Mevacor and fish oil. She is asked to redouble her efforts to take them daily and try to avoid trans fats.  GERD Mild, infrequent symptoms she is asked to avoid offending foods and may use OTC meds prn  Depression with anxiety Patient notes multiple persistent stressors. Restarted trying an SSRI. Is given sertraline 50 mg tabs to take a half a tab by mouth each bedtime x7 days and then to increase to 1 tab by mouth daily. She is very difficult and concentration, anhedonia and difficulty sleeping.

## 2011-06-27 NOTE — Assessment & Plan Note (Signed)
Mild, infrequent symptoms she is asked to avoid offending foods and may use OTC meds prn

## 2011-08-26 ENCOUNTER — Ambulatory Visit (INDEPENDENT_AMBULATORY_CARE_PROVIDER_SITE_OTHER): Payer: BC Managed Care – PPO | Admitting: Family Medicine

## 2011-08-26 ENCOUNTER — Encounter: Payer: Self-pay | Admitting: Family Medicine

## 2011-08-26 VITALS — BP 122/78 | HR 80 | Temp 98.2°F | Ht 61.5 in | Wt 148.8 lb

## 2011-08-26 DIAGNOSIS — F341 Dysthymic disorder: Secondary | ICD-10-CM

## 2011-08-26 DIAGNOSIS — Z23 Encounter for immunization: Secondary | ICD-10-CM

## 2011-08-26 DIAGNOSIS — F418 Other specified anxiety disorders: Secondary | ICD-10-CM

## 2011-08-26 DIAGNOSIS — G4700 Insomnia, unspecified: Secondary | ICD-10-CM

## 2011-08-26 DIAGNOSIS — K219 Gastro-esophageal reflux disease without esophagitis: Secondary | ICD-10-CM

## 2011-08-26 DIAGNOSIS — Z0000 Encounter for general adult medical examination without abnormal findings: Secondary | ICD-10-CM

## 2011-08-26 DIAGNOSIS — E782 Mixed hyperlipidemia: Secondary | ICD-10-CM

## 2011-08-26 DIAGNOSIS — R030 Elevated blood-pressure reading, without diagnosis of hypertension: Secondary | ICD-10-CM

## 2011-08-26 MED ORDER — SERTRALINE HCL 100 MG PO TABS: 100.0000 mg | ORAL_TABLET | Freq: Every day | ORAL | Status: DC

## 2011-08-26 NOTE — Patient Instructions (Signed)
Depression You have signs of depression. This is a common problem. It can occur at any age. It is often hard to recognize. People can suffer from depression and still have moments of enjoyment. Depression interferes with your basic ability to function in life. It upsets your relationships, sleep, eating, and work habits. CAUSES  Depression is believed to be caused by an imbalance in brain chemicals. It may be triggered by an unpleasant event. Relationship crises, a death in the family, financial worries, retirement, or other stressors are normal causes of depression. Depression may also start for no known reason. Other factors that may play a part include medical illnesses, some medicines, genetics, and alcohol or drug abuse. SYMPTOMS   Feeling unhappy or worthless.   Long-lasting (chronic) tiredness or worn-out feeling.   Self-destructive thoughts and actions.   Not being able to sleep or sleeping too much.   Eating more than usual or not eating at all.   Headaches or feeling anxious.   Trouble concentrating or making decisions.   Unexplained physical problems and substance abuse.  TREATMENT  Depression usually gets better with treatment. This can include:  Antidepressant medicines. It can take weeks before the proper dose is achieved and benefits are reached.   Talking with a therapist, clergyperson, counselor, or friend. These people can help you gain insight into your problem and regain control of your life.   Eating a good diet.   Getting regular physical exercise, such as walking for 30 minutes every day.   Not abusing alcohol or drugs.  Treating depression often takes 6 months or longer. This length of treatment is needed to keep symptoms from returning. Call your caregiver and arrange for follow-up care as suggested. SEEK IMMEDIATE MEDICAL CARE IF:   You start to have thoughts of hurting yourself or others.   Call your local emergency services (911 in [place]   Go to  your local medical emergency department.   Call the National Suicide Prevention Lifeline: 1-800-273-TALK (1-800-273-8255).  Document Released: 10/13/2005 Document Revised: 06/25/2011 Document Reviewed: 03/15/2010 Exit[employer] Patient Information 2012 [employer]. 

## 2011-08-26 NOTE — Assessment & Plan Note (Signed)
Improved on repeat check no change in therapy today

## 2011-08-26 NOTE — Assessment & Plan Note (Addendum)
Not getting much better, no difficulty with the Sertraline but no great improvement either. Is using the alprazolam once to twice a day as needed and notes it calms her when she is stressed but does not help the low mood. She agrees to the increase of Sertraline to 100mg  po daily and reassess at next visit.

## 2011-08-26 NOTE — Assessment & Plan Note (Signed)
No major trouble recently. No change in therapy, avoid acidic and spicy foods

## 2011-08-26 NOTE — Assessment & Plan Note (Signed)
Tolerating Mevacor and fish oil continue same

## 2011-08-26 NOTE — Assessment & Plan Note (Signed)
Ambien working well for sleep may continue the same

## 2011-08-26 NOTE — Assessment & Plan Note (Signed)
-   Agrees to flu shot today

## 2011-08-26 NOTE — Progress Notes (Signed)
Tricia Fleming [id number] Sep 27, 1951 08/26/2011      Progress Note-Follow Up  Subjective  Chief Complaint  Chief Complaint  Patient presents with  . Follow-up    2 month follow up    HPI  Patient is a 60 year old Caucasian female in today for followup of multiple medical problems. She denies any recent illness fevers chills, chest pain, palpitations, headache, GI or GU complaints. No shortness of breath.  She is concerned over persistent anhedonia. Denies suicidal ideation. Did not have any difficulties with starting the sertraline but does not feel it has improved her mood tremendously. The Xanax does help to manage her anxiety and she takes one in the morning and sometimes in the evening. Ambien is helping her to sleep well and she does feel more rested.  Past Medical History  Diagnosis Date  . Hyperlipidemia   . Anxiety   . Preventative health care 06/27/2011  . Depression with anxiety 06/27/2011  . Depression     History reviewed. No pertinent past surgical history.  Family History  Problem Relation Age of Onset  . Cancer Father     metastic prostate  . Diabetes Mother   . Hypertension Mother   . Coronary artery disease Mother   . Transient ischemic attack Sister   . Heart disease Brother   . Benign prostatic hyperplasia Brother   . Insulin resistance Brother   . Hypertension Sister   . Benign prostatic hyperplasia Brother   . Alcohol abuse Brother   . Arthritis Sister   . Esophageal varices Brother   . Cirrhosis Brother   . Diabetes Maternal Grandmother   . Coronary artery disease Maternal Grandmother   . Diabetes Maternal Grandfather     History   Social History  . Marital Status: Married    Spouse Name: N/A    Number of Children: N/A  . Years of Education: N/A   Occupational History  . Not on file.   Social History Main Topics  . Smoking status: Never Smoker   . Smokeless tobacco: Never Used  . Alcohol Use: 0.0 oz/week    0 drink(s) per week     a couple beers a week  . Drug Use: No  . Sexually Active: Yes -- Female partner(s)   Other Topics Concern  . Not on file   Social History Narrative  . No narrative on file    Current Outpatient Prescriptions on File Prior to Visit  Medication Sig Dispense Refill  . ALPRAZolam (XANAX) 0.5 MG tablet Take 1 tablet (0.5 mg total) by mouth 2 (two) times daily as needed for anxiety.  60 tablet  3  . fish oil-omega-3 fatty acids 1000 MG capsule Take 1,000 mg by mouth daily. 2 caps       . lovastatin (MEVACOR) 20 MG tablet Take 20 mg by mouth at bedtime.        Marland Kitchen zolpidem (AMBIEN) 10 MG tablet Take 1 tablet (10 mg total) by mouth at bedtime as needed for sleep.  30 tablet  3    No Known Allergies  Review of Systems  Review of Systems  Constitutional: Positive for malaise/fatigue. Negative for fever.  HENT: Negative for congestion.   Eyes: Negative for discharge.  Respiratory: Negative for shortness of breath.   Cardiovascular: Negative for chest pain, palpitations and leg swelling.  Gastrointestinal: Negative for nausea, abdominal pain and diarrhea.  Genitourinary: Negative for dysuria.  Musculoskeletal: Negative for falls.  Skin: Negative for rash.  Neurological: Negative for  loss of consciousness and headaches.  Endo/Heme/Allergies: Negative for polydipsia.  Psychiatric/Behavioral: Positive for depression. Negative for suicidal ideas. The patient is nervous/anxious and has insomnia.     Objective  BP 122/78  Pulse 80  Temp(Src) 98.2 F (36.8 C) (Oral)  Ht 5' 1.5" (1.562 m)  Wt 148 lb 12.8 oz (67.495 kg)  BMI 27.66 kg/m2  SpO2 93%  Physical Exam  Physical Exam  Constitutional: She is oriented to person, place, and time and well-developed, well-nourished, and in no distress. No distress.  HENT:  Head: Normocephalic and atraumatic.  Eyes: Conjunctivae are normal.  Neck: Neck supple. No thyromegaly present.  Cardiovascular: Normal rate, regular rhythm and normal heart  sounds.   No murmur heard. Pulmonary/Chest: Effort normal and breath sounds normal. She has no wheezes.  Abdominal: She exhibits no distension and no mass.  Musculoskeletal: She exhibits no edema.  Lymphadenopathy:    She has no cervical adenopathy.  Neurological: She is alert and oriented to person, place, and time.  Skin: Skin is warm and dry. No rash noted. She is not diaphoretic.  Psychiatric: Memory, affect and judgment normal.    Lab Results  Component Value Date   TSH 0.46 06/26/2011   Lab Results  Component Value Date   WBC 5.5 06/26/2011   HGB 14.3 06/26/2011   HCT 42.6 06/26/2011   MCV 87.8 06/26/2011   PLT 202.0 06/26/2011   Lab Results  Component Value Date   CREATININE 0.8 06/26/2011   BUN 19 06/26/2011   NA 143 06/26/2011   K 4.8 06/26/2011   CL 105 06/26/2011   CO2 29 06/26/2011   Lab Results  Component Value Date   ALT 23 06/26/2011   AST 23 06/26/2011   ALKPHOS 44 06/26/2011   BILITOT 0.5 06/26/2011   Lab Results  Component Value Date   CHOL 232* 06/26/2011   Lab Results  Component Value Date   HDL 53.60 06/26/2011   No results found for this basename: LDLCALC   Lab Results  Component Value Date   TRIG 157.0* 06/26/2011   Lab Results  Component Value Date   CHOLHDL 4 06/26/2011     Assessment & Plan  Depression with anxiety Not getting much better, no difficulty with the Sertraline but no great improvement either. Is using the alprazolam once to twice a day as needed and notes it calms her when she is stressed but does not help the low mood. She agrees to the increase of Sertraline to 100mg  po daily and reassess at next visit.  ELEVATED BP READING WITHOUT DX HYPERTENSION Improved on repeat check no change in therapy today  GERD No major trouble recently. No change in therapy, avoid acidic and spicy foods  INSOMNIA UNSPECIFIED Ambien working well for sleep may continue the same  Preventative health care Agrees to flu shot today  MIXED  HYPERLIPIDEMIA Tolerating Mevacor and fish oil continue same

## 2011-10-13 ENCOUNTER — Telehealth: Payer: Self-pay | Admitting: Family Medicine

## 2011-10-13 DIAGNOSIS — G4700 Insomnia, unspecified: Secondary | ICD-10-CM

## 2011-10-13 MED ORDER — ALPRAZOLAM 1 MG PO TABS: 1.0000 mg | ORAL_TABLET | Freq: Two times a day (BID) | ORAL | Status: DC | PRN

## 2011-10-13 MED ORDER — ZOLPIDEM TARTRATE 10 MG PO TABS: 10.0000 mg | ORAL_TABLET | Freq: Every evening | ORAL | Status: DC | PRN

## 2011-10-13 NOTE — Telephone Encounter (Signed)
Patient is traveling over Christmas would like to go ahead and refill zolpidem & alprazolam. Can the strength of the Alprazolam be changed from .5 mg to 1mg ?

## 2011-10-13 NOTE — Telephone Encounter (Signed)
OK to refill the Zolpidem with same sig and same # with Refills, can try an increase in the Alprazolam to 1 mg po bid prn anxiety, but only give # 60, no refill til she keeps her appt in January to discuss

## 2011-10-13 NOTE — Telephone Encounter (Signed)
Please advise 

## 2011-10-30 ENCOUNTER — Ambulatory Visit: Payer: BC Managed Care – PPO | Admitting: Family Medicine

## 2011-11-06 ENCOUNTER — Telehealth: Payer: Self-pay

## 2011-11-06 ENCOUNTER — Telehealth: Payer: Self-pay | Admitting: Family Medicine

## 2011-11-06 MED ORDER — ALPRAZOLAM 1 MG PO TABS: 1.0000 mg | ORAL_TABLET | Freq: Two times a day (BID) | ORAL | Status: DC | PRN

## 2011-11-06 NOTE — Telephone Encounter (Signed)
Xanax reprinted because I printed it with Dr Elby Showers name

## 2011-11-06 NOTE — Telephone Encounter (Signed)
OK to refill with same number no refills for now.

## 2011-11-06 NOTE — Telephone Encounter (Signed)
Pt was given #60 on 10-13-11? Please advise Xanax refill?

## 2011-11-06 NOTE — Telephone Encounter (Signed)
RX sent and signed by Dr Milinda Cave since Dr Abner Greenspan won't return until the 14th of January

## 2011-11-06 NOTE — Telephone Encounter (Signed)
Please send refill for alprazolam to [facility]

## 2011-11-13 ENCOUNTER — Ambulatory Visit: Payer: BC Managed Care – PPO | Admitting: Family Medicine

## 2011-11-19 ENCOUNTER — Ambulatory Visit (INDEPENDENT_AMBULATORY_CARE_PROVIDER_SITE_OTHER): Payer: BC Managed Care – PPO | Admitting: Family Medicine

## 2011-11-19 ENCOUNTER — Encounter: Payer: Self-pay | Admitting: Family Medicine

## 2011-11-19 VITALS — BP 129/84 | HR 91 | Temp 97.6°F | Ht 61.5 in | Wt 149.0 lb

## 2011-11-19 DIAGNOSIS — F418 Other specified anxiety disorders: Secondary | ICD-10-CM

## 2011-11-19 DIAGNOSIS — F329 Major depressive disorder, single episode, unspecified: Secondary | ICD-10-CM

## 2011-11-19 DIAGNOSIS — G4700 Insomnia, unspecified: Secondary | ICD-10-CM

## 2011-11-19 DIAGNOSIS — R030 Elevated blood-pressure reading, without diagnosis of hypertension: Secondary | ICD-10-CM

## 2011-11-19 DIAGNOSIS — F32A Depression, unspecified: Secondary | ICD-10-CM

## 2011-11-19 DIAGNOSIS — F341 Dysthymic disorder: Secondary | ICD-10-CM

## 2011-11-19 DIAGNOSIS — E782 Mixed hyperlipidemia: Secondary | ICD-10-CM

## 2011-11-19 MED ORDER — ALPRAZOLAM 1 MG PO TABS: 1.0000 mg | ORAL_TABLET | Freq: Two times a day (BID) | ORAL | Status: DC | PRN

## 2011-11-19 MED ORDER — CITALOPRAM HYDROBROMIDE 10 MG PO TABS: 10.0000 mg | ORAL_TABLET | Freq: Every day | ORAL | Status: DC

## 2011-11-19 NOTE — Patient Instructions (Signed)
Depression You have signs of depression. This is a common problem. It can occur at any age. It is often hard to recognize. People can suffer from depression and still have moments of enjoyment. Depression interferes with your basic ability to function in life. It upsets your relationships, sleep, eating, and work habits. CAUSES  Depression is believed to be caused by an imbalance in brain chemicals. It may be triggered by an unpleasant event. Relationship crises, a death in the family, financial worries, retirement, or other stressors are normal causes of depression. Depression may also start for no known reason. Other factors that may play a part include medical illnesses, some medicines, genetics, and alcohol or drug abuse. SYMPTOMS   Feeling unhappy or worthless.   Long-lasting (chronic) tiredness or worn-out feeling.   Self-destructive thoughts and actions.   Not being able to sleep or sleeping too much.   Eating more than usual or not eating at all.   Headaches or feeling anxious.   Trouble concentrating or making decisions.   Unexplained physical problems and substance abuse.  TREATMENT  Depression usually gets better with treatment. This can include:  Antidepressant medicines. It can take weeks before the proper dose is achieved and benefits are reached.   Talking with a therapist, clergyperson, counselor, or friend. These people can help you gain insight into your problem and regain control of your life.   Eating a good diet.   Getting regular physical exercise, such as walking for 30 minutes every day.   Not abusing alcohol or drugs.  Treating depression often takes 6 months or longer. This length of treatment is needed to keep symptoms from returning. Call your caregiver and arrange for follow-up care as suggested. SEEK IMMEDIATE MEDICAL CARE IF:   You start to have thoughts of hurting yourself or others.   Call your local emergency services (911 in [place]   Go to  your local medical emergency department.   Call the National Suicide Prevention Lifeline: 1-800-273-TALK (1-800-273-8255).  Document Released: 10/13/2005 Document Revised: 06/25/2011 Document Reviewed: 03/15/2010 Exit[employer] Patient Information 2012 [employer]. 

## 2011-11-19 NOTE — Assessment & Plan Note (Signed)
Does not use Zolpidem nightly but does find it helpful when she needs it

## 2011-11-19 NOTE — Progress Notes (Signed)
Patient ID: Tricia Fleming, female   DOB: 07-18-1951, 61 y.o.   MRN: [id number] Tricia Fleming [id number] 1951/05/01 11/19/2011      Progress Note-Follow Up  Subjective  Chief Complaint  Chief Complaint  Patient presents with  . Follow-up    follow up on zoloft- pt stopped taking    HPI  Is a 84 Caucasian female who is in today for routine followup. She is continuing to struggle with anhedonia and low mood. She was taking Zoloft but felt it made her more irritable so she stopped it about a month ago. She denies suicidal ideation or risk to self but doesn't know which continuing low mood, difficulty concentrating and fatigue she thinks is related to her low mood. She is willing to try something else has not tried anything else previously. Continues to use alprazolam as needed roughly once and occasionally twice a day with good effect. Zolpidem helps for sleep at night and she uses it intermittently as well. She's had no recent illness. She's had no fevers, chills, headache, chest pain palp, shortness of breath, GI or GU complaints since her last visit.  Past Medical History  Diagnosis Date  . Hyperlipidemia   . Anxiety   . Preventative health care 06/27/2011  . Depression with anxiety 06/27/2011  . Depression     History reviewed. No pertinent past surgical history.  Family History  Problem Relation Age of Onset  . Cancer Father     metastic prostate  . Diabetes Mother   . Hypertension Mother   . Coronary artery disease Mother   . Transient ischemic attack Sister   . Heart disease Brother   . Benign prostatic hyperplasia Brother   . Insulin resistance Brother   . Hypertension Sister   . Benign prostatic hyperplasia Brother   . Alcohol abuse Brother   . Arthritis Sister   . Esophageal varices Brother   . Cirrhosis Brother   . Diabetes Maternal Grandmother   . Coronary artery disease Maternal Grandmother   . Diabetes Maternal Grandfather     History   Social History    . Marital Status: Married    Spouse Name: N/A    Number of Children: N/A  . Years of Education: N/A   Occupational History  . Not on file.   Social History Main Topics  . Smoking status: Never Smoker   . Smokeless tobacco: Never Used  . Alcohol Use: 0.0 oz/week    0 drink(s) per week     a couple beers a week  . Drug Use: No  . Sexually Active: Yes -- Female partner(s)   Other Topics Concern  . Not on file   Social History Narrative  . No narrative on file    Current Outpatient Prescriptions on File Prior to Visit  Medication Sig Dispense Refill  . fish oil-omega-3 fatty acids 1000 MG capsule Take 1,000 mg by mouth daily. 2 caps       . lovastatin (MEVACOR) 20 MG tablet Take 10 mg by mouth at bedtime.       Marland Kitchen zolpidem (AMBIEN) 10 MG tablet Take 1 tablet (10 mg total) by mouth at bedtime as needed for sleep.  30 tablet  3    No Known Allergies  Review of Systems  Review of Systems  Constitutional: Negative for fever and malaise/fatigue.  HENT: Negative for congestion.   Eyes: Negative for discharge.  Respiratory: Negative for shortness of breath.   Cardiovascular: Negative for chest pain, palpitations  and leg swelling.  Gastrointestinal: Negative for nausea, abdominal pain and diarrhea.  Genitourinary: Negative for dysuria.  Musculoskeletal: Negative for falls.  Skin: Negative for rash.  Neurological: Negative for loss of consciousness and headaches.  Endo/Heme/Allergies: Negative for polydipsia.  Psychiatric/Behavioral: Positive for depression. Negative for suicidal ideas. The patient is nervous/anxious and has insomnia.     Objective  BP 129/84  Pulse 91  Temp(Src) 97.6 F (36.4 C) (Temporal)  Ht 5' 1.5" (1.562 m)  Wt 149 lb (67.586 kg)  BMI 27.70 kg/m2  SpO2 98%  Physical Exam  Physical Exam  Constitutional: She is oriented to person, place, and time and well-developed, well-nourished, and in no distress. No distress.  HENT:  Head: Normocephalic and  atraumatic.  Eyes: Conjunctivae are normal.  Neck: Neck supple. No thyromegaly present.  Cardiovascular: Normal rate, regular rhythm and normal heart sounds.   No murmur heard. Pulmonary/Chest: Effort normal and breath sounds normal. She has no wheezes.  Abdominal: She exhibits no distension and no mass.  Musculoskeletal: She exhibits no edema.  Lymphadenopathy:    She has no cervical adenopathy.  Neurological: She is alert and oriented to person, place, and time.  Skin: Skin is warm and dry. No rash noted. She is not diaphoretic.  Psychiatric: Memory, affect and judgment normal.    Lab Results  Component Value Date   TSH 0.46 06/26/2011   Lab Results  Component Value Date   WBC 5.5 06/26/2011   HGB 14.3 06/26/2011   HCT 42.6 06/26/2011   MCV 87.8 06/26/2011   PLT 202.0 06/26/2011   Lab Results  Component Value Date   CREATININE 0.8 06/26/2011   BUN 19 06/26/2011   NA 143 06/26/2011   K 4.8 06/26/2011   CL 105 06/26/2011   CO2 29 06/26/2011   Lab Results  Component Value Date   ALT 23 06/26/2011   AST 23 06/26/2011   ALKPHOS 44 06/26/2011   BILITOT 0.5 06/26/2011   Lab Results  Component Value Date   CHOL 232* 06/26/2011   Lab Results  Component Value Date   HDL 53.60 06/26/2011   No results found for this basename: LDLCALC   Lab Results  Component Value Date   TRIG 157.0* 06/26/2011   Lab Results  Component Value Date   CHOLHDL 4 06/26/2011     Assessment & Plan  Depression with anxiety Patient stopped her Zoloft a month ago because she actually became more irritable. Since stopping it she continues to anhedonia and difficulty concentrating. Has not had any previous medication so we will try a course of citalopram. Start on 10 mg by mouth daily reassess in 8 weeks or sooner as needed. May continue alprazolam when necessary and is given a refill today.  ELEVATED BP READING WITHOUT DX HYPERTENSION Good control today  INSOMNIA UNSPECIFIED Does not use Zolpidem  nightly but does find it helpful when she needs it  MIXED HYPERLIPIDEMIA Does not remember her lovastatin nightly. Encouraged to try taking it routinely and if she needs to she may change when she takes it. Continue fish oil and avoid contacts

## 2011-11-19 NOTE — Assessment & Plan Note (Signed)
Good control today

## 2011-11-19 NOTE — Assessment & Plan Note (Signed)
Does not remember her lovastatin nightly. Encouraged to try taking it routinely and if she needs to she may change when she takes it. Continue fish oil and avoid contacts

## 2011-11-19 NOTE — Assessment & Plan Note (Signed)
Patient stopped her Zoloft a month ago because she actually became more irritable. Since stopping it she continues to anhedonia and difficulty concentrating. Has not had any previous medication so we will try a course of citalopram. Start on 10 mg by mouth daily reassess in 8 weeks or sooner as needed. May continue alprazolam when necessary and is given a refill today.

## 2012-01-14 ENCOUNTER — Ambulatory Visit (INDEPENDENT_AMBULATORY_CARE_PROVIDER_SITE_OTHER): Payer: BC Managed Care – PPO | Admitting: Family Medicine

## 2012-01-14 ENCOUNTER — Encounter: Payer: Self-pay | Admitting: Family Medicine

## 2012-01-14 VITALS — BP 138/82 | HR 80 | Temp 97.3°F | Ht 61.5 in | Wt 149.0 lb

## 2012-01-14 DIAGNOSIS — R030 Elevated blood-pressure reading, without diagnosis of hypertension: Secondary | ICD-10-CM

## 2012-01-14 DIAGNOSIS — F418 Other specified anxiety disorders: Secondary | ICD-10-CM

## 2012-01-14 DIAGNOSIS — F341 Dysthymic disorder: Secondary | ICD-10-CM

## 2012-01-14 DIAGNOSIS — R739 Hyperglycemia, unspecified: Secondary | ICD-10-CM

## 2012-01-14 DIAGNOSIS — R7309 Other abnormal glucose: Secondary | ICD-10-CM

## 2012-01-14 DIAGNOSIS — E785 Hyperlipidemia, unspecified: Secondary | ICD-10-CM

## 2012-01-14 DIAGNOSIS — G4700 Insomnia, unspecified: Secondary | ICD-10-CM

## 2012-01-14 DIAGNOSIS — F419 Anxiety disorder, unspecified: Secondary | ICD-10-CM

## 2012-01-14 DIAGNOSIS — F32A Depression, unspecified: Secondary | ICD-10-CM

## 2012-01-14 DIAGNOSIS — IMO0001 Reserved for inherently not codable concepts without codable children: Secondary | ICD-10-CM

## 2012-01-14 LAB — HEPATIC FUNCTION PANEL
ALT: 22 U/L (ref 0–35)
AST: 23 U/L (ref 0–37)
Albumin: 4.6 g/dL (ref 3.5–5.2)
Total Protein: 7.5 g/dL (ref 6.0–8.3)

## 2012-01-14 LAB — CBC
HCT: 43.8 % (ref 36.0–46.0)
MCHC: 33.1 g/dL (ref 30.0–36.0)
MCV: 87.9 fl (ref 78.0–100.0)
Platelets: 219 10*3/uL (ref 150.0–400.0)
RBC: 4.98 Mil/uL (ref 3.87–5.11)

## 2012-01-14 LAB — RENAL FUNCTION PANEL
Albumin: 4.6 g/dL (ref 3.5–5.2)
Calcium: 10.2 mg/dL (ref 8.4–10.5)
Creatinine, Ser: 0.7 mg/dL (ref 0.4–1.2)
Glucose, Bld: 114 mg/dL — ABNORMAL HIGH (ref 70–99)
Phosphorus: 3.8 mg/dL (ref 2.3–4.6)

## 2012-01-14 LAB — HEMOGLOBIN A1C: Hgb A1c MFr Bld: 5.8 % (ref 4.6–6.5)

## 2012-01-14 MED ORDER — CITALOPRAM HYDROBROMIDE 20 MG PO TABS: 20.0000 mg | ORAL_TABLET | Freq: Every day | ORAL | Status: DC

## 2012-01-14 MED ORDER — ZOLPIDEM TARTRATE 10 MG PO TABS: 10.0000 mg | ORAL_TABLET | Freq: Every evening | ORAL | Status: DC | PRN

## 2012-01-14 MED ORDER — ALPRAZOLAM 1 MG PO TABS: 1.0000 mg | ORAL_TABLET | Freq: Two times a day (BID) | ORAL | Status: DC | PRN

## 2012-01-14 NOTE — Assessment & Plan Note (Signed)
Mild elevation  - will continue to monitor

## 2012-01-14 NOTE — Assessment & Plan Note (Addendum)
Will try increasing Citalopram to 20 mg, has not had any trouble with the 10 mg dose but she does not feel it has helped much. May continue to use Alprazolam prn

## 2012-01-14 NOTE — Patient Instructions (Signed)

## 2012-01-14 NOTE — Progress Notes (Signed)
Patient ID: Tricia Fleming, female   DOB: [birth year], 61 y.o.   MRN: [id number] JOWANA THUMMA [id number] 1950/11/05 01/14/2012      Progress Note-Follow Up  Subjective  Chief Complaint  Chief Complaint  Patient presents with  . Follow-up    anxiety/depression    HPI  Patient is a 61 year old Caucasian female in today for follow up on depression and anxiety. She has not had any concerning side effects with the Citalopram but has noted any significant improvement. No increased anxiety, diarrhea, reflux, cp, pain, sob, gu c/o. No other new concerns.  Past Medical History  Diagnosis Date  . Hyperlipidemia   . Anxiety   . Preventative health care 06/27/2011  . Depression with anxiety 06/27/2011  . Depression     History reviewed. No pertinent past surgical history.  Family History  Problem Relation Age of Onset  . Cancer Father     metastic prostate  . Diabetes Mother   . Hypertension Mother   . Coronary artery disease Mother   . Transient ischemic attack Sister   . Heart disease Brother   . Benign prostatic hyperplasia Brother   . Insulin resistance Brother   . Hypertension Sister   . Benign prostatic hyperplasia Brother   . Alcohol abuse Brother   . Arthritis Sister   . Esophageal varices Brother   . Cirrhosis Brother   . Diabetes Maternal Grandmother   . Coronary artery disease Maternal Grandmother   . Diabetes Maternal Grandfather     History   Social History  . Marital Status: Married    Spouse Name: N/A    Number of Children: N/A  . Years of Education: N/A   Occupational History  . Not on file.   Social History Main Topics  . Smoking status: Never Smoker   . Smokeless tobacco: Never Used  . Alcohol Use: 0.0 oz/week    0 drink(s) per week     a couple beers a week  . Drug Use: No  . Sexually Active: Yes -- Female partner(s)   Other Topics Concern  . Not on file   Social History Narrative  . No narrative on file    Current Outpatient  Prescriptions on File Prior to Visit  Medication Sig Dispense Refill  . citalopram (CELEXA) 10 MG tablet Take 1 tablet (10 mg total) by mouth daily.  30 tablet  2  . fish oil-omega-3 fatty acids 1000 MG capsule Take 1,000 mg by mouth daily. 2 caps       . lovastatin (MEVACOR) 20 MG tablet Take 10 mg by mouth at bedtime.         No Known Allergies  Review of Systems  Review of Systems  Constitutional: Negative for fever and malaise/fatigue.  HENT: Negative for congestion.   Eyes: Negative for discharge.  Respiratory: Negative for shortness of breath.   Cardiovascular: Negative for chest pain, palpitations and leg swelling.  Gastrointestinal: Negative for nausea, abdominal pain and diarrhea.  Genitourinary: Negative for dysuria.  Musculoskeletal: Negative for falls.  Skin: Negative for rash.  Neurological: Negative for loss of consciousness and headaches.  Endo/Heme/Allergies: Negative for polydipsia.  Psychiatric/Behavioral: Positive for depression. Negative for suicidal ideas. The patient is nervous/anxious and has insomnia.     Objective  BP 138/82  Pulse 80  Temp(Src) 97.3 F (36.3 C) (Temporal)  Ht 5' 1.5" (1.562 m)  Wt 149 lb (67.586 kg)  BMI 27.70 kg/m2  Physical Exam  Physical Exam  Constitutional:  She is oriented to person, place, and time and well-developed, well-nourished, and in no distress. No distress.  HENT:  Head: Normocephalic and atraumatic.  Eyes: Conjunctivae are normal.  Neck: Neck supple. No thyromegaly present.  Cardiovascular: Normal rate, regular rhythm and normal heart sounds.   No murmur heard. Pulmonary/Chest: Effort normal and breath sounds normal. She has no wheezes.  Abdominal: She exhibits no distension and no mass.  Musculoskeletal: She exhibits no edema.  Lymphadenopathy:    She has no cervical adenopathy.  Neurological: She is alert and oriented to person, place, and time.  Skin: Skin is warm and dry. No rash noted. She is not  diaphoretic.  Psychiatric: Memory, affect and judgment normal.    Lab Results  Component Value Date   TSH 0.46 06/26/2011   Lab Results  Component Value Date   WBC 5.5 06/26/2011   HGB 14.3 06/26/2011   HCT 42.6 06/26/2011   MCV 87.8 06/26/2011   PLT 202.0 06/26/2011   Lab Results  Component Value Date   CREATININE 0.8 06/26/2011   BUN 19 06/26/2011   NA 143 06/26/2011   K 4.8 06/26/2011   CL 105 06/26/2011   CO2 29 06/26/2011   Lab Results  Component Value Date   ALT 23 06/26/2011   AST 23 06/26/2011   ALKPHOS 44 06/26/2011   BILITOT 0.5 06/26/2011   Lab Results  Component Value Date   CHOL 232* 06/26/2011   Lab Results  Component Value Date   HDL 53.60 06/26/2011   No results found for this basename: LDLCALC   Lab Results  Component Value Date   TRIG 157.0* 06/26/2011   Lab Results  Component Value Date   CHOLHDL 4 06/26/2011     Assessment & Plan  Depression with anxiety Will try increasing Citalopram to 20 mg, has not had any trouble with the 10 mg dose but she does not feel it has helped much. May continue to use Alprazolam prn  ELEVATED BP READING WITHOUT DX HYPERTENSION Mild elevation will continue to monitor

## 2012-02-11 ENCOUNTER — Other Ambulatory Visit: Payer: Self-pay | Admitting: Family Medicine

## 2012-02-11 MED ORDER — LOVASTATIN 20 MG PO TABS: 10.0000 mg | ORAL_TABLET | Freq: Every day | ORAL | Status: DC

## 2012-02-11 NOTE — Telephone Encounter (Signed)
RX sent

## 2012-03-11 ENCOUNTER — Other Ambulatory Visit: Payer: Self-pay

## 2012-03-11 NOTE — Telephone Encounter (Signed)
We got a refill request for Citalopram 10 mg. Pt has this and Citalopram 20 mg in med list? Is she taking both or was the 10 mg upped to 20 mg? Please advise? RX sent to [phone]

## 2012-03-11 NOTE — Telephone Encounter (Signed)
Left a message on patients cell phone to return my call 

## 2012-03-11 NOTE — Telephone Encounter (Signed)
I can never be sure what they are actually taking. You should check with her and refill what she is taking if it working. I believe we upped it

## 2012-03-12 ENCOUNTER — Other Ambulatory Visit: Payer: Self-pay

## 2012-03-12 DIAGNOSIS — F32A Depression, unspecified: Secondary | ICD-10-CM

## 2012-03-12 DIAGNOSIS — F329 Major depressive disorder, single episode, unspecified: Secondary | ICD-10-CM

## 2012-03-12 MED ORDER — CITALOPRAM HYDROBROMIDE 10 MG PO TABS: 10.0000 mg | ORAL_TABLET | Freq: Every day | ORAL | Status: DC

## 2012-03-12 NOTE — Telephone Encounter (Signed)
I left another message for patient to return my call. We received another request for Citalopram 10 mg. I went ahead and filled so patient wouldn't be out this weekend

## 2012-03-12 NOTE — Telephone Encounter (Signed)
Addended by: Court Joy on: 03/12/2012 02:50 PM   Modules accepted: Orders

## 2012-03-12 NOTE — Telephone Encounter (Signed)
Patient states she is taking 10 mg of the Citalopram

## 2012-04-21 ENCOUNTER — Encounter: Payer: Self-pay | Admitting: Family Medicine

## 2012-04-21 ENCOUNTER — Ambulatory Visit (INDEPENDENT_AMBULATORY_CARE_PROVIDER_SITE_OTHER): Payer: BC Managed Care – PPO | Admitting: Family Medicine

## 2012-04-21 VITALS — BP 132/83 | HR 84 | Temp 97.3°F | Ht 61.5 in | Wt 150.0 lb

## 2012-04-21 DIAGNOSIS — E782 Mixed hyperlipidemia: Secondary | ICD-10-CM

## 2012-04-21 DIAGNOSIS — R030 Elevated blood-pressure reading, without diagnosis of hypertension: Secondary | ICD-10-CM

## 2012-04-21 DIAGNOSIS — F419 Anxiety disorder, unspecified: Secondary | ICD-10-CM

## 2012-04-21 DIAGNOSIS — F32A Depression, unspecified: Secondary | ICD-10-CM

## 2012-04-21 DIAGNOSIS — G4700 Insomnia, unspecified: Secondary | ICD-10-CM

## 2012-04-21 DIAGNOSIS — F418 Other specified anxiety disorders: Secondary | ICD-10-CM

## 2012-04-21 DIAGNOSIS — F341 Dysthymic disorder: Secondary | ICD-10-CM

## 2012-04-21 MED ORDER — ALPRAZOLAM 1 MG PO TABS: 1.0000 mg | ORAL_TABLET | Freq: Two times a day (BID) | ORAL | Status: DC | PRN

## 2012-04-21 MED ORDER — ZOLPIDEM TARTRATE 10 MG PO TABS: 10.0000 mg | ORAL_TABLET | Freq: Every evening | ORAL | Status: DC | PRN

## 2012-04-21 MED ORDER — CITALOPRAM HYDROBROMIDE 20 MG PO TABS: 20.0000 mg | ORAL_TABLET | Freq: Every day | ORAL | Status: DC

## 2012-04-21 MED ORDER — LOVASTATIN 40 MG PO TABS: 40.0000 mg | ORAL_TABLET | Freq: Every day | ORAL | Status: DC

## 2012-04-21 NOTE — Assessment & Plan Note (Signed)
Has been taking her lovastatin very inconsistently, sometimes 0 mg sometimes 10 mg and sometimes 20 mg. She is encouraged to increase her dose to at least 20 mg daily and take it consistently, she agrees, she has a prescription for 40 mg so she can just take a 1/2 tab for now

## 2012-04-21 NOTE — Assessment & Plan Note (Addendum)
Did not continue the Celexa after 4 weeks because she felt it was not helping any. She denies having any concerning side effects. She has been taking her Alprazolam daily and a second time each day as well. She agrees to try taking a higher dose of the Citalopram at 20 mg daily and to take it routinely for at least 2 months before deciding whether it works or not. May continue Alprazolam prn for now, rf given

## 2012-04-21 NOTE — Assessment & Plan Note (Signed)
Does respond well to Zolpidem prn, helps her sleep roughly 7 hours. Will continue this for now, as needed

## 2012-04-21 NOTE — Assessment & Plan Note (Signed)
Well controlled today, no meds added

## 2012-04-21 NOTE — Progress Notes (Signed)
Patient ID: Tricia Fleming, female   DOB: [birth year], 61 y.o.   MRN: [id number] AZZIE THIEM [id number] [birth year] 04/21/2012      Progress Note-Follow Up  Subjective  Chief Complaint  Chief Complaint  Patient presents with  . Follow-up    3 month    HPI  Patient is a 61 year old Caucasian female in today for followup. She was last in about 3 months ago we started her on citalopram. She took it for roughly a month and did not feel like it was helping so she stopped it. She denies having any difficulty with it during that time. She has needed to take alprazolam daily. Does feel that helps. Occasionally take a second dose on a bad day. She has a lot of ongoing stress and acknowledges she may still need something. Ambien has been used intermittently with good effect for her sleep. She's not had any physical illness. No fevers, chills, chest pain, palpitations, shortness of breath. She has been taking her lovastatin and very irregularly. Sometimes she takes none and sometimes she takes a 1/2 tab and sometimes she takes a full pain. She denies having any difficulty with it.         Past Medical History  Diagnosis Date  . Hyperlipidemia   . Anxiety   . Preventative health care 06/27/2011  . Depression with anxiety 06/27/2011  . Depression     History reviewed. No pertinent past surgical history.  Family History  Problem Relation Age of Onset  . Cancer Father     metastic prostate  . Diabetes Mother   . Hypertension Mother   . Coronary artery disease Mother   . Transient ischemic attack Sister   . Heart disease Brother   . Benign prostatic hyperplasia Brother   . Insulin resistance Brother   . Hypertension Sister   . Benign prostatic hyperplasia Brother   . Alcohol abuse Brother   . Arthritis Sister   . Esophageal varices Brother   . Cirrhosis Brother   . Diabetes Maternal Grandmother   . Coronary artery disease Maternal Grandmother   . Diabetes Maternal Grandfather      History   Social History  . Marital Status: Married    Spouse Name: N/A    Number of Children: N/A  . Years of Education: N/A   Occupational History  . Not on file.   Social History Main Topics  . Smoking status: Never Smoker   . Smokeless tobacco: Never Used  . Alcohol Use: 0.0 oz/week    0 drink(s) per week     a couple beers a week  . Drug Use: No  . Sexually Active: Yes -- Female partner(s)   Other Topics Concern  . Not on file   Social History Narrative  . No narrative on file    Current Outpatient Prescriptions on File Prior to Visit  Medication Sig Dispense Refill  . fish oil-omega-3 fatty acids 1000 MG capsule Take 1,000 mg by mouth daily. 2 caps       . DISCONTD: zolpidem (AMBIEN) 10 MG tablet Take 1 tablet (10 mg total) by mouth at bedtime as needed for sleep.  30 tablet  3  . DISCONTD: zolpidem (AMBIEN) 10 MG tablet Take 1 tablet (10 mg total) by mouth at bedtime as needed. 1/2 to 1 for insomnia  30 tablet  1    No Known Allergies  Review of Systems  Review of Systems  Constitutional: Negative for fever and malaise/fatigue.  HENT:  Negative for congestion.   Eyes: Negative for discharge.  Respiratory: Negative for shortness of breath.   Cardiovascular: Negative for chest pain, palpitations and leg swelling.  Gastrointestinal: Negative for nausea, abdominal pain and diarrhea.  Genitourinary: Negative for dysuria.  Musculoskeletal: Negative for falls.  Skin: Negative for rash.  Neurological: Negative for loss of consciousness and headaches.  Endo/Heme/Allergies: Negative for polydipsia.  Psychiatric/Behavioral: Negative for depression and suicidal ideas. The patient is not nervous/anxious and does not have insomnia.     Objective  BP 132/83  Pulse 84  Temp 97.3 F (36.3 C) (Temporal)  Ht 5' 1.5" (1.562 m)  Wt 150 lb (68.04 kg)  BMI 27.88 kg/m2  SpO2 96%  Physical Exam  Physical Exam  Constitutional: She is oriented to person, place, and  time and well-developed, well-nourished, and in no distress. No distress.  HENT:  Head: Normocephalic and atraumatic.  Eyes: Conjunctivae are normal.  Neck: Neck supple. No thyromegaly present.  Cardiovascular: Normal rate, regular rhythm and normal heart sounds.   No murmur heard. Pulmonary/Chest: Effort normal and breath sounds normal. She has no wheezes.  Abdominal: She exhibits no distension and no mass.  Musculoskeletal: She exhibits no edema.  Lymphadenopathy:    She has no cervical adenopathy.  Neurological: She is alert and oriented to person, place, and time.  Skin: Skin is warm and dry. No rash noted. She is not diaphoretic.  Psychiatric: Memory, affect and judgment normal.    Lab Results  Component Value Date   TSH 1.76 01/14/2012   Lab Results  Component Value Date   WBC 4.4* 01/14/2012   HGB 14.5 01/14/2012   HCT 43.8 01/14/2012   MCV 87.9 01/14/2012   PLT 219.0 01/14/2012   Lab Results  Component Value Date   CREATININE 0.7 01/14/2012   BUN 18 01/14/2012   NA 138 01/14/2012   K 5.1 01/14/2012   CL 100 01/14/2012   CO2 31 01/14/2012   Lab Results  Component Value Date   ALT 22 01/14/2012   AST 23 01/14/2012   ALKPHOS 49 01/14/2012   BILITOT 0.7 01/14/2012   Lab Results  Component Value Date   CHOL 251* 01/14/2012   Lab Results  Component Value Date   HDL 55.80 01/14/2012   No results found for this basename: [facility]   Lab Results  Component Value Date   TRIG 159.0* 01/14/2012   Lab Results  Component Value Date   CHOLHDL 4 01/14/2012     Assessment & Plan  INSOMNIA UNSPECIFIED Does respond well to Zolpidem prn, helps her sleep roughly 7 hours. Will continue this for now, as needed  Depression with anxiety Did not continue the Celexa after 4 weeks because she felt it was not helping any. She denies having any concerning side effects. She has been taking her Alprazolam daily and a second time each day as well. She agrees to try taking a higher dose of the  Citalopram at 20 mg daily and to take it routinely for at least 2 months before deciding whether it works or not. May continue Alprazolam prn for now, rf given  ELEVATED BP READING WITHOUT DX HYPERTENSION Well controlled today, no meds added  MIXED HYPERLIPIDEMIA Has been taking her lovastatin very inconsistently, sometimes 0 mg sometimes 10 mg and sometimes 20 mg. She is encouraged to increase her dose to at least 20 mg daily and take it consistently, she agrees, she has a prescription for 40 mg so she can just take a  1/2 tab for now

## 2012-04-21 NOTE — Patient Instructions (Addendum)

## 2012-05-17 ENCOUNTER — Telehealth: Payer: Self-pay | Admitting: Family Medicine

## 2012-05-17 NOTE — Telephone Encounter (Signed)
I informed pt that I don't see the paperwork anywhere and that I keep copy of everything that I fax and I don't see anything in that stack either.  Patient will drop off form to be filled out and faxed

## 2012-06-30 ENCOUNTER — Encounter: Payer: Self-pay | Admitting: Family Medicine

## 2012-06-30 ENCOUNTER — Ambulatory Visit (INDEPENDENT_AMBULATORY_CARE_PROVIDER_SITE_OTHER): Payer: BC Managed Care – PPO | Admitting: Family Medicine

## 2012-06-30 VITALS — BP 127/80 | HR 80 | Temp 97.8°F | Ht 61.5 in | Wt 147.8 lb

## 2012-06-30 DIAGNOSIS — D72819 Decreased white blood cell count, unspecified: Secondary | ICD-10-CM

## 2012-06-30 DIAGNOSIS — G4700 Insomnia, unspecified: Secondary | ICD-10-CM

## 2012-06-30 DIAGNOSIS — R739 Hyperglycemia, unspecified: Secondary | ICD-10-CM

## 2012-06-30 DIAGNOSIS — F419 Anxiety disorder, unspecified: Secondary | ICD-10-CM

## 2012-06-30 DIAGNOSIS — F32A Depression, unspecified: Secondary | ICD-10-CM

## 2012-06-30 DIAGNOSIS — R7309 Other abnormal glucose: Secondary | ICD-10-CM

## 2012-06-30 DIAGNOSIS — F341 Dysthymic disorder: Secondary | ICD-10-CM

## 2012-06-30 DIAGNOSIS — E782 Mixed hyperlipidemia: Secondary | ICD-10-CM

## 2012-06-30 DIAGNOSIS — E785 Hyperlipidemia, unspecified: Secondary | ICD-10-CM

## 2012-06-30 DIAGNOSIS — Z0000 Encounter for general adult medical examination without abnormal findings: Secondary | ICD-10-CM

## 2012-06-30 DIAGNOSIS — F329 Major depressive disorder, single episode, unspecified: Secondary | ICD-10-CM

## 2012-06-30 DIAGNOSIS — F418 Other specified anxiety disorders: Secondary | ICD-10-CM

## 2012-06-30 DIAGNOSIS — K219 Gastro-esophageal reflux disease without esophagitis: Secondary | ICD-10-CM

## 2012-06-30 LAB — CBC
Platelets: 209 10*3/uL (ref 150.0–400.0)
RBC: 4.85 Mil/uL (ref 3.87–5.11)
WBC: 4.7 10*3/uL (ref 4.5–10.5)

## 2012-06-30 LAB — HEMOGLOBIN A1C: Hgb A1c MFr Bld: 5.7 % (ref 4.6–6.5)

## 2012-06-30 LAB — HEPATIC FUNCTION PANEL
Albumin: 4.4 g/dL (ref 3.5–5.2)
Alkaline Phosphatase: 43 U/L (ref 39–117)
Total Protein: 7.2 g/dL (ref 6.0–8.3)

## 2012-06-30 LAB — RENAL FUNCTION PANEL
CO2: 31 mEq/L (ref 19–32)
Chloride: 103 mEq/L (ref 96–112)
GFR: 64.9 mL/min (ref >60.00)
Sodium: 139 mEq/L (ref 135–145)

## 2012-06-30 LAB — LIPID PANEL
HDL: 52.7 mg/dL (ref >39.00)
LDL Cholesterol: 109 mg/dL — ABNORMAL HIGH (ref 0–99)
Total CHOL/HDL Ratio: 4
Triglycerides: 167 mg/dL — ABNORMAL HIGH (ref 0.0–149.0)

## 2012-06-30 LAB — TSH: TSH: 1.24 u[IU]/mL (ref 0.35–5.50)

## 2012-06-30 MED ORDER — ALPRAZOLAM 1 MG PO TABS: 1.0000 mg | ORAL_TABLET | Freq: Two times a day (BID) | ORAL | Status: DC | PRN

## 2012-06-30 NOTE — Patient Instructions (Addendum)
Hyperglycemia Meal Planning Guide The diabetes meal planning guide is a tool to help you plan your meals and snacks. It is important for people with diabetes to manage their blood glucose (sugar) levels. Choosing the right foods and the right amounts throughout your day will help control your blood glucose. Eating right can even help you improve your blood pressure and reach or maintain a healthy weight. CARBOHYDRATE COUNTING MADE EASY When you eat carbohydrates, they turn to sugar. This raises your blood glucose level. Counting carbohydrates can help you control this level so you feel better. When you plan your meals by counting carbohydrates, you can have more flexibility in what you eat and balance your medicine with your food intake. Carbohydrate counting simply means adding up the total amount of carbohydrate grams in your meals and snacks. Try to eat about the same amount at each meal. Foods with carbohydrates are listed below. Each portion below is 1 carbohydrate serving or 15 grams of carbohydrates. Ask your dietician how many grams of carbohydrates you should eat at each meal or snack. Grains and Starches  1 slice bread.    English muffin or hotdog/hamburger bun.    cup cold cereal (unsweetened).   ? cup cooked pasta or rice.    cup starchy vegetables (corn, potatoes, peas, beans, winter squash).   1 tortilla (6 inches).    bagel.   1 waffle or pancake (size of a CD).    cup cooked cereal.   4 to 6 small crackers.  *Whole grain is recommended. Fruit  1 cup fresh unsweetened berries, melon, papaya, pineapple.   1 small fresh fruit.    banana or mango.    cup fruit juice (4 oz unsweetened).    cup canned fruit in natural juice or water.   2 tbs dried fruit.   12 to 15 grapes or cherries.  Milk and Yogurt  1 cup fat-free or 1% milk.   1 cup soy milk.   6 oz light yogurt with sugar-free sweetener.   6 oz low-fat soy yogurt.   6 oz plain yogurt.    Vegetables  1 cup raw or  cup cooked is counted as 0 carbohydrates or a "free" food.   If you eat 3 or more servings at 1 meal, count them as 1 carbohydrate serving.  Other Carbohydrates   oz chips or pretzels.    cup ice cream or frozen yogurt.    cup sherbet or sorbet.   2 inch square cake, no frosting.   1 tbs honey, sugar, jam, jelly, or syrup.   2 small cookies.   3 squares of graham crackers.   3 cups popcorn.   6 crackers.   1 cup broth-based soup.   Count 1 cup casserole or other mixed foods as 2 carbohydrate servings.   Foods with less than 20 calories in a serving may be counted as 0 carbohydrates or a "free" food.  You may want to purchase a book or computer software that lists the carbohydrate gram counts of different foods. In addition, the nutrition facts panel on the labels of the foods you eat are a good source of this information. The label will tell you how big the serving size is and the total number of carbohydrate grams you will be eating per serving. Divide this number by 15 to obtain the number of carbohydrate servings in a portion. Remember, 1 carbohydrate serving equals 15 grams of carbohydrate. SERVING SIZES Measuring foods and serving sizes helps  you make sure you are getting the right amount of food. The list below tells how big or small some common serving sizes are.  1 oz.........4 stacked dice.   3 oz........Marland KitchenDeck of cards.   1 tsp.......Marland KitchenTip of little finger.   1 tbs......Marland KitchenMarland KitchenThumb.   2 tbs.......Marland KitchenGolf ball.    cup......Marland KitchenHalf of a fist.   1 cup.......Marland KitchenA fist.  SAMPLE DIABETES MEAL PLAN Below is a sample meal plan that includes foods from the grain and starches, dairy, vegetable, fruit, and meat groups. A dietician can individualize a meal plan to fit your calorie needs and tell you the number of servings needed from each food group. However, controlling the total amount of carbohydrates in your meal or snack is more important than  making sure you include all of the food groups at every meal. You may interchange carbohydrate containing foods (dairy, starches, and fruits). The meal plan below is an example of a 2000 calorie diet using carbohydrate counting. This meal plan has 17 carbohydrate servings. Breakfast  1 cup oatmeal (2 carb servings).    cup light yogurt (1 carb serving).   1 cup blueberries (1 carb serving).    cup almonds.  Snack  1 large apple (2 carb servings).   1 low-fat string cheese stick.  Lunch  Chicken breast salad.   1 cup spinach.    cup chopped tomatoes.   2 oz chicken breast, sliced.   2 tbs low-fat [place] dressing.   12 whole-wheat crackers (2 carb servings).   12 to 15 grapes (1 carb serving).   1 cup low-fat milk (1 carb serving).  Snack  1 cup carrots.    cup hummus (1 carb serving).  Dinner  3 oz broiled salmon.   1 cup brown rice (3 carb servings).  Snack  1  cups steamed broccoli (1 carb serving) drizzled with 1 tsp olive oil and lemon juice.   1 cup light pudding (2 carb servings).  DIABETES MEAL PLANNING WORKSHEET Your dietician can use this worksheet to help you decide how many servings of foods and what types of foods are right for you.  BREAKFAST Food Group and Servings / Carb Servings Grain/Starches __________________________________ Dairy __________________________________________ Vegetable ______________________________________ Fruit ___________________________________________ Meat __________________________________________ Fat ____________________________________________ LUNCH Food Group and Servings / Carb Servings Grain/Starches ___________________________________ Dairy ___________________________________________ Fruit ____________________________________________ Meat ___________________________________________ Fat _____________________________________________ Laural Golden Food Group and Servings / Carb Servings Grain/Starches  ___________________________________ Dairy ___________________________________________ Fruit ____________________________________________ Meat ___________________________________________ Fat _____________________________________________ SNACKS Food Group and Servings / Carb Servings Grain/Starches ___________________________________ Dairy ___________________________________________ Vegetable _______________________________________ Fruit ____________________________________________ Meat ___________________________________________ Fat _____________________________________________ DAILY TOTALS Starches _________________________ Vegetable ________________________ Fruit ____________________________ Dairy ____________________________ Meat ____________________________ Fat ______________________________ Document Released: 07/10/2005 Document Revised: 10/02/2011 Document Reviewed: 05/21/2009 ExitCare Patient Information 2012 [place], [place].  Consider switching from fish oil to MegaRed krill oil caps 1 daily, by Schiff, generic is fine

## 2012-07-01 ENCOUNTER — Encounter: Payer: Self-pay | Admitting: Family Medicine

## 2012-07-05 ENCOUNTER — Other Ambulatory Visit: Payer: Self-pay | Admitting: Family Medicine

## 2012-07-05 ENCOUNTER — Telehealth: Payer: Self-pay | Admitting: Family Medicine

## 2012-07-05 DIAGNOSIS — G4700 Insomnia, unspecified: Secondary | ICD-10-CM

## 2012-07-05 DIAGNOSIS — F329 Major depressive disorder, single episode, unspecified: Secondary | ICD-10-CM

## 2012-07-05 DIAGNOSIS — F32A Depression, unspecified: Secondary | ICD-10-CM

## 2012-07-05 DIAGNOSIS — Z1231 Encounter for screening mammogram for malignant neoplasm of breast: Secondary | ICD-10-CM

## 2012-07-05 NOTE — Telephone Encounter (Signed)
Patient is moving out of the state, can she get 3 month refills for her Ambien & [facility]?

## 2012-07-05 NOTE — Assessment & Plan Note (Signed)
Ambien prn. 

## 2012-07-05 NOTE — Assessment & Plan Note (Signed)
Discussed need for heart healthy diet, regular exercise, good sleep.

## 2012-07-05 NOTE — Progress Notes (Signed)
Patient ID: Tricia Fleming, female   DOB: [birth year], 61 y.o.   MRN: [id number] Tricia Fleming [id number] 01-05-1951 07/05/2012      Progress Note-Follow Up  Subjective  Chief Complaint  Chief Complaint  Patient presents with  . Annual Exam    physical    HPI  Patient is a 61 year old Caucasian female who is in today for annual exam. She says overall she's doing well. She chose not to stay on citalopram. She did not have any trouble with that but did not feel it was helping. She took it for about a week and half. At this point she continues to struggle with a great deal of stress but feels she's handling it relatively well. Chooses not to elaborate. Otherwise her physical health has been good. She's had no trouble with heartburn. She denies headaches, chest pain, palpitations, shortness of breath, GI or GU complaints at this time. She has not been exercising regularly and has been trying to eat a heart healthy diet  Past Medical History  Diagnosis Date  . Hyperlipidemia   . Anxiety   . Preventative health care 06/27/2011  . Depression with anxiety 06/27/2011  . Depression     History reviewed. No pertinent past surgical history.  Family History  Problem Relation Age of Onset  . Cancer Father     metastic prostate  . Diabetes Mother   . Hypertension Mother   . Coronary artery disease Mother   . Transient ischemic attack Sister   . Heart disease Brother   . Benign prostatic hyperplasia Brother   . Insulin resistance Brother   . Hypertension Sister   . Benign prostatic hyperplasia Brother   . Alcohol abuse Brother   . Arthritis Sister   . Esophageal varices Brother   . Cirrhosis Brother   . Diabetes Maternal Grandmother   . Coronary artery disease Maternal Grandmother   . Diabetes Maternal Grandfather     History   Social History  . Marital Status: Married    Spouse Name: N/A    Number of Children: N/A  . Years of Education: N/A   Occupational History  . Not on  file.   Social History Main Topics  . Smoking status: Never Smoker   . Smokeless tobacco: Never Used  . Alcohol Use: 0.0 oz/week    0 drink(s) per week     a couple beers a week  . Drug Use: No  . Sexually Active: Yes -- Female partner(s)   Other Topics Concern  . Not on file   Social History Narrative  . No narrative on file    Current Outpatient Prescriptions on File Prior to Visit  Medication Sig Dispense Refill  . fish oil-omega-3 fatty acids 1000 MG capsule Take 1,000 mg by mouth daily. 2 caps       . lovastatin (MEVACOR) 40 MG tablet Take 1 tablet (40 mg total) by mouth at bedtime.  90 tablet  1  . zolpidem (AMBIEN) 10 MG tablet Take 1 tablet (10 mg total) by mouth at bedtime as needed for sleep.  30 tablet  3    No Known Allergies  Review of Systems  Review of Systems  Constitutional: Negative for fever, chills and malaise/fatigue.  HENT: Negative for hearing loss, nosebleeds and congestion.   Eyes: Negative for discharge.  Respiratory: Negative for cough, sputum production, shortness of breath and wheezing.   Cardiovascular: Negative for chest pain, palpitations and leg swelling.  Gastrointestinal: Negative for heartburn, nausea,  vomiting, abdominal pain, diarrhea, constipation and blood in stool.  Genitourinary: Negative for dysuria, urgency, frequency and hematuria.  Musculoskeletal: Negative for myalgias, back pain and falls.  Skin: Negative for rash.  Neurological: Negative for dizziness, tremors, sensory change, focal weakness, loss of consciousness, weakness and headaches.  Endo/Heme/Allergies: Negative for polydipsia. Does not bruise/bleed easily.  Psychiatric/Behavioral: Positive for depression. Negative for suicidal ideas. The patient is nervous/anxious and has insomnia.     Objective  BP 127/80  Pulse 80  Temp 97.8 F (36.6 C) (Temporal)  Ht 5' 1.5" (1.562 m)  Wt 147 lb 12.8 oz (67.042 kg)  BMI 27.47 kg/m2  SpO2 98%  Physical Exam  Physical  Exam  Constitutional: She is oriented to person, place, and time and well-developed, well-nourished, and in no distress. No distress.  HENT:  Head: Normocephalic and atraumatic.  Right Ear: External ear normal.  Left Ear: External ear normal.  Nose: Nose normal.  Mouth/Throat: Oropharynx is clear and moist. No oropharyngeal exudate.  Eyes: Conjunctivae are normal. Pupils are equal, round, and reactive to light. Right eye exhibits no discharge. Left eye exhibits no discharge. No scleral icterus.  Neck: Normal range of motion. Neck supple. No thyromegaly present.  Cardiovascular: Normal rate, regular rhythm, normal heart sounds and intact distal pulses.   No murmur heard. Pulmonary/Chest: Effort normal and breath sounds normal. No respiratory distress. She has no wheezes. She has no rales.  Abdominal: Soft. Bowel sounds are normal. She exhibits no distension and no mass. There is no tenderness.  Musculoskeletal: Normal range of motion. She exhibits no edema and no tenderness.  Lymphadenopathy:    She has no cervical adenopathy.  Neurological: She is alert and oriented to person, place, and time. She has normal reflexes. No cranial nerve deficit. Coordination normal.  Skin: Skin is warm and dry. No rash noted. She is not diaphoretic.  Psychiatric: Mood, memory and affect normal.    Lab Results  Component Value Date   TSH 1.24 06/30/2012   Lab Results  Component Value Date   WBC 4.7 06/30/2012   HGB 14.1 06/30/2012   HCT 42.6 06/30/2012   MCV 87.8 06/30/2012   PLT 209.0 06/30/2012   Lab Results  Component Value Date   CREATININE 0.9 06/30/2012   BUN 17 06/30/2012   NA 139 06/30/2012   K 4.9 06/30/2012   CL 103 06/30/2012   CO2 31 06/30/2012   Lab Results  Component Value Date   ALT 27 06/30/2012   AST 24 06/30/2012   ALKPHOS 43 06/30/2012   BILITOT 0.8 06/30/2012   Lab Results  Component Value Date   CHOL 195 06/30/2012   Lab Results  Component Value Date   HDL 52.70 06/30/2012   Lab Results    Component Value Date   LDLCALC 109* 06/30/2012   Lab Results  Component Value Date   TRIG 167.0* 06/30/2012   Lab Results  Component Value Date   CHOLHDL 4 06/30/2012     Assessment & Plan  Depression with anxiety Chose not to continue the Citalpram, took in for roughly a week and a 1/2 and it was not helping yet, she feels she is doing OK and does not want to start any new meds at this time, May continue to use Alprazolam prn but she is warned that it can be habituating na dif her use increases she will have to consider an SSRI again  Preventative health care Discussed need for heart healthy diet, regular exercise, good sleep.  MIXED HYPERLIPIDEMIA Continue Lovastatin and avoid trans fats  GERD Avoid offending foods, may use Tums prn, Zantac prn  INSOMNIA UNSPECIFIED Ambien prn

## 2012-07-05 NOTE — Assessment & Plan Note (Signed)
Continue Lovastatin and avoid trans fats

## 2012-07-05 NOTE — Assessment & Plan Note (Signed)
Avoid offending foods, may use Tums prn, Zantac prn

## 2012-07-05 NOTE — Assessment & Plan Note (Signed)
Chose not to continue the Citalpram, took in for roughly a week and a 1/2 and it was not helping yet, she feels she is doing OK and does not want to start any new meds at this time, May continue to use Alprazolam prn but she is warned that it can be habituating na dif her use increases she will have to consider an SSRI again

## 2012-07-05 NOTE — Progress Notes (Signed)
Patient ID: Tricia Fleming, female   DOB: 11-26-1950, 61 y.o.   MRN: [id number] Note opened in error

## 2012-07-06 MED ORDER — ZOLPIDEM TARTRATE 10 MG PO TABS: 10.0000 mg | ORAL_TABLET | Freq: Every evening | ORAL | Status: DC | PRN

## 2012-07-06 MED ORDER — ALPRAZOLAM 1 MG PO TABS: 1.0000 mg | ORAL_TABLET | Freq: Two times a day (BID) | ORAL | Status: DC | PRN

## 2012-07-06 NOTE — Telephone Encounter (Signed)
I called in both to pharmacist (JIm) per MD's verbal ok.

## 2012-07-21 ENCOUNTER — Ambulatory Visit
Admission: RE | Admit: 2012-07-21 | Discharge: 2012-07-21 | Disposition: A | Discharge status: Home / Self Care | Payer: BC Managed Care – PPO | Source: Ambulatory Visit | Attending: Family Medicine | Admitting: Family Medicine

## 2012-07-21 DIAGNOSIS — Z1231 Encounter for screening mammogram for malignant neoplasm of breast: Secondary | ICD-10-CM

## 2012-07-28 ENCOUNTER — Other Ambulatory Visit: Payer: Self-pay | Admitting: Family Medicine

## 2012-07-28 DIAGNOSIS — R928 Other abnormal and inconclusive findings on diagnostic imaging of breast: Secondary | ICD-10-CM

## 2012-08-02 ENCOUNTER — Other Ambulatory Visit: Payer: BC Managed Care – PPO

## 2012-08-05 ENCOUNTER — Ambulatory Visit
Admission: RE | Admit: 2012-08-05 | Discharge: 2012-08-05 | Disposition: A | Discharge status: Home / Self Care | Payer: BC Managed Care – PPO | Source: Ambulatory Visit | Attending: Family Medicine | Admitting: Family Medicine

## 2012-08-05 DIAGNOSIS — R928 Other abnormal and inconclusive findings on diagnostic imaging of breast: Secondary | ICD-10-CM

## 2012-09-30 ENCOUNTER — Other Ambulatory Visit: Payer: Self-pay | Admitting: Emergency Medicine

## 2012-09-30 DIAGNOSIS — G4700 Insomnia, unspecified: Secondary | ICD-10-CM

## 2012-09-30 NOTE — Telephone Encounter (Signed)
Patient called back and stated that [facility] [place] [place].

## 2012-09-30 NOTE — Telephone Encounter (Signed)
Please advise refill? Last Ambien refill was wrote 07-06-12 quantity 30 with 2 refills. I left a detailed message on patients voicemail to return my call so we would know were to send this tomorrow in [place] if MD approves it?

## 2012-09-30 NOTE — Telephone Encounter (Signed)
i am willing to send a one month supply of Ambien to [facility], no refills.

## 2012-09-30 NOTE — Telephone Encounter (Signed)
Patient needs refill for Ambien but has a question about it being faxed to the pharmacy because she has moved please call patient.

## 2012-10-01 ENCOUNTER — Other Ambulatory Visit: Payer: Self-pay | Admitting: Family Medicine

## 2012-10-01 MED ORDER — ZOLPIDEM TARTRATE 10 MG PO TABS: 10.0000 mg | ORAL_TABLET | Freq: Every evening | ORAL | Status: DC | PRN

## 2012-10-01 MED ORDER — LOVASTATIN 40 MG PO TABS: 40.0000 mg | ORAL_TABLET | Freq: Every day | ORAL | Status: DC

## 2012-10-01 NOTE — Telephone Encounter (Signed)
RX faxed

## 2012-10-01 NOTE — Telephone Encounter (Signed)
OK to give 30 day supply so she has time to find a new pmd

## 2012-10-01 NOTE — Telephone Encounter (Signed)
Are we refilling this medication for the patient with no additional refills also?

## 2012-10-11 ENCOUNTER — Other Ambulatory Visit: Payer: Self-pay | Admitting: Family Medicine

## 2012-10-11 NOTE — Telephone Encounter (Signed)
Left a message for patient to return my call. The reason 30 days was sent instead of 90 is because the patient moved out of state and Dr Abner Greenspan stated that 30 days could be called into pharmacy so patient would have a months worth of medication until finding a new MD.

## 2012-10-25 NOTE — Telephone Encounter (Signed)
Pt informed and states she has not found a physician? I informed pt that we could maybe do another 30 day supply but not a 90 day supply. Please advise? Pt informed that MD is out of the office until 10-28-12

## 2012-10-27 NOTE — Telephone Encounter (Signed)
She can have 90 day of the Lovastatin but no further refills, she needs to find a local MD

## 2012-10-28 MED ORDER — LOVASTATIN 40 MG PO TABS: 40.0000 mg | ORAL_TABLET | Freq: Every day | ORAL | Status: DC

## 2012-10-28 NOTE — Telephone Encounter (Signed)
Pt informed and RX sent to pharmacy  

## 2012-11-01 ENCOUNTER — Telehealth: Payer: Self-pay

## 2012-11-01 NOTE — Telephone Encounter (Signed)
It is fine to decrease the stength of the Alprazolam from 1 mg to 0.5 mg use same sig and same number.

## 2012-11-01 NOTE — Telephone Encounter (Signed)
Tricia [facility] called stating pt just came into the pharmacy and has 1 more refill on her Xanax and would like to know if MD will approve for that RX to be .5 mg instead of 1 mg? Please advise? Callback number is [phone]

## 2012-11-01 NOTE — Telephone Encounter (Signed)
Pharmacist informed. 

## 2012-11-05 ENCOUNTER — Telehealth: Payer: Self-pay | Admitting: Family Medicine

## 2012-11-05 NOTE — Telephone Encounter (Signed)
Pt informed that this was done on Monday 11-01-12

## 2012-11-05 NOTE — Telephone Encounter (Signed)
Patient is requesting a Rx for the Alprazolam to be changed from 1MG  to .5 MG to be [facility] to [facility] [phone]. She has been breaking the pill in half.

## 2012-12-11 ENCOUNTER — Other Ambulatory Visit: Payer: Self-pay

## 2012-12-29 ENCOUNTER — Other Ambulatory Visit: Payer: BC Managed Care – PPO

## 2012-12-31 ENCOUNTER — Ambulatory Visit: Payer: BC Managed Care – PPO | Admitting: Family Medicine

## 2013-01-04 ENCOUNTER — Telehealth: Payer: Self-pay | Admitting: Family Medicine

## 2013-01-04 NOTE — Telephone Encounter (Signed)
Please advise refill? Last Rx's in January we told patient no more refills after that one that she needed to find a local doctor in [place]?

## 2013-01-04 NOTE — Telephone Encounter (Signed)
Pt left me a message returning my call.  I left another message for pt to return my call

## 2013-01-04 NOTE — Telephone Encounter (Signed)
Patient informed. 

## 2013-01-04 NOTE — Telephone Encounter (Signed)
Left a message for patient to return my call. 

## 2013-01-04 NOTE — Telephone Encounter (Signed)
No more refills 

## 2013-01-04 NOTE — Telephone Encounter (Signed)
Patient returned phone call. °

## 2013-01-05 ENCOUNTER — Ambulatory Visit: Payer: BC Managed Care – PPO | Admitting: Family Medicine

## 2013-02-09 ENCOUNTER — Ambulatory Visit (INDEPENDENT_AMBULATORY_CARE_PROVIDER_SITE_OTHER): Payer: BC Managed Care – PPO | Admitting: Family Medicine

## 2013-02-09 ENCOUNTER — Encounter: Payer: Self-pay | Admitting: Family Medicine

## 2013-02-09 ENCOUNTER — Ambulatory Visit: Payer: BC Managed Care – PPO | Admitting: Nurse Practitioner

## 2013-02-09 VITALS — BP 138/70 | HR 102 | Temp 98.0°F | Ht 61.5 in | Wt 149.1 lb

## 2013-02-09 DIAGNOSIS — F341 Dysthymic disorder: Secondary | ICD-10-CM

## 2013-02-09 DIAGNOSIS — N649 Disorder of breast, unspecified: Secondary | ICD-10-CM

## 2013-02-09 DIAGNOSIS — F418 Other specified anxiety disorders: Secondary | ICD-10-CM

## 2013-02-09 DIAGNOSIS — M25511 Pain in right shoulder: Secondary | ICD-10-CM

## 2013-02-09 DIAGNOSIS — M25519 Pain in unspecified shoulder: Secondary | ICD-10-CM

## 2013-02-09 DIAGNOSIS — R030 Elevated blood-pressure reading, without diagnosis of hypertension: Secondary | ICD-10-CM

## 2013-02-09 DIAGNOSIS — F411 Generalized anxiety disorder: Secondary | ICD-10-CM

## 2013-02-09 DIAGNOSIS — E785 Hyperlipidemia, unspecified: Secondary | ICD-10-CM

## 2013-02-09 DIAGNOSIS — IMO0001 Reserved for inherently not codable concepts without codable children: Secondary | ICD-10-CM

## 2013-02-09 DIAGNOSIS — G4700 Insomnia, unspecified: Secondary | ICD-10-CM

## 2013-02-09 DIAGNOSIS — E782 Mixed hyperlipidemia: Secondary | ICD-10-CM

## 2013-02-09 LAB — CBC
MCHC: 33.8 g/dL (ref 30.0–36.0)
MCV: 86 fl (ref 78.0–100.0)
Platelets: 247 10*3/uL (ref 150.0–400.0)
RBC: 5.15 Mil/uL — ABNORMAL HIGH (ref 3.87–5.11)

## 2013-02-09 LAB — HEPATIC FUNCTION PANEL
ALT: 35 U/L (ref 0–35)
Albumin: 4.8 g/dL (ref 3.5–5.2)
Bilirubin, Direct: 0.1 mg/dL (ref 0.0–0.3)
Total Protein: 7.2 g/dL (ref 6.0–8.3)

## 2013-02-09 LAB — RENAL FUNCTION PANEL
Albumin: 4.8 g/dL (ref 3.5–5.2)
BUN: 15 mg/dL (ref 6–23)
Calcium: 9.7 mg/dL (ref 8.4–10.5)
Creatinine, Ser: 0.9 mg/dL (ref 0.4–1.2)
Glucose, Bld: 103 mg/dL — ABNORMAL HIGH (ref 70–99)

## 2013-02-09 LAB — LIPID PANEL
Cholesterol: 210 mg/dL — ABNORMAL HIGH (ref 0–200)
Triglycerides: 156 mg/dL — ABNORMAL HIGH (ref 0.0–149.0)
VLDL: 31.2 mg/dL (ref 0.0–40.0)

## 2013-02-09 LAB — TSH: TSH: 1.2 u[IU]/mL (ref 0.35–5.50)

## 2013-02-09 MED ORDER — LOVASTATIN 40 MG PO TABS: 40.0000 mg | ORAL_TABLET | Freq: Every day | ORAL | Status: DC

## 2013-02-09 MED ORDER — ZOLPIDEM TARTRATE 10 MG PO TABS: 10.0000 mg | ORAL_TABLET | Freq: Every evening | ORAL | Status: DC | PRN

## 2013-02-09 MED ORDER — ALPRAZOLAM 1 MG PO TABS: 1.0000 mg | ORAL_TABLET | Freq: Two times a day (BID) | ORAL | Status: DC | PRN

## 2013-02-09 NOTE — Patient Instructions (Addendum)
Fibrocystic Breast Changes  Fibrocystic breast changes is a non-cancerous(benign) condition that about half of all women have at some time in their life. It is also called benign breast disease and mammary dysplasia. It may also be called fibrocystic breast disease, but it is not really a disease. It is a common condition that occurs when women go through the hormonal changes during their menstrual cycle, between the ages of 20 to 50. Menopausal women do not have this problem, unless they are on hormone therapy. It can affect one or both breasts. This is not a sign that you will later get cancer.  CAUSES   Overgrowth of cells lining the milk ducts, or enlarged lobules in the breast, cause the breast duct to become blocked. The duct then fills up with fluid. This is like a small balloon filled with water. It is called a cyst. Over time, with repeated inflammation there is a tendency to form scar tissue. This scar tissue becomes the fibrous part of fibrocystic disease. The exact cause of this happening is not known, but it may be related to the female hormones, estrogen and progesterone. Heredity (genetics) may also be a factor in some cases.  SYMPTOMS   · Tenderness.  · Swelling.  · Rope-like feeling.  · Lumpy breast, one or both sides.  · Changes in the size of the breasts, before and after the menstrual period (larger before, smaller after).  · Green or dark brown nipple discharge (not blood).  Symptoms are usually worse before periods (menstrual cycle) and get better toward the end of menstruation. Usually, it is temporary minor discomfort. But some women have severe pain.   DIAGNOSIS   Check your breasts monthly. The best time to check your breasts is after your period. If you check them during your period, you are more likely to feel the normal glands enlarged, as a result of the hormonal changes that happen right before your period. If you do not have menstrual periods, check your breasts the first day of every  month. Become familiar with the way your own breasts feel. It is then easier to notice if there are changes, such as more tenderness, a new growth, change in breast size, or a change in a lump that has always been there. All breasts lumps need to be investigated, to rule out breast cancer. See your caregiver as soon as possible, if you find a lump. Most breast lumps are not cancerous. Excellent treatment is available for ones that are.   To make a diagnosis, your caregiver will examine your breasts and may recommend other tests, such as:  · Mammogram (breast X-ray).  · Ultrasound.  · MRI (magnetic resonance imaging).  · Removing fluid from the cyst with a fine needle, under local anesthesia (aspiration).  · Taking a breast tissue sample (breast biopsy).  Some questions your caregiver will ask are:  · What was the date of your last period?  · When did the lump show up?  · Is there any discharge from your breast?  · Is the breast tender or painful?  · Are the symptoms in one or both breasts?  · Has the lump changed in size from month-to-month? How long has it been present?  · Any family history of breast problems?  · Any past breast problems?  · Any history of breast surgery?  · Are you taking any medications?  · When was your last mammogram, and where was it done?  TREATMENT   ·   Dietary changes help to prevent or reduce the symptoms of fibrocystic breast changes.  · You may need to stop consuming all foods that contain caffeine, such as chocolate, sodas, coffee, and tea.  · Reducing sugar and fat in your diet may also help.  · Decrease estrogen in your diet. Some sources include commercially raised meats which contain estrogen. Eliminate other natural estrogens.  · Birth control pills can also make symptoms worse.  · Natural progesterone cream, applied at a dose of 15 to 20 milligrams per day, from ovulation until a day or two before your period returns, may help with returning to normal breast tissue over several  months. Seek advice from your caregiver.  · Over-the-counter pain pills may help, as recommended by your caregiver.  · Danazol hormone (female-like hormone) is sometimes used. It may cause hair growth and acne.  · Needle aspiration can be used, to remove fluid from the cyst.  · Surgery may be needed, to remove a large, persistent, and tender cyst.  · Evening primrose oil may help with the tenderness and pain. It has linolenic acid that women may not have enough of.  HOME CARE INSTRUCTIONS   · Examine your breasts after every menstrual period.  · If you do not have menstrual periods, examine your breasts the first day of every month.  · Wear a firm support bra, especially when exercising.  · Decrease or avoid caffeine in your diet.  · Decrease the fat and sugar in your diet.  · Eat a balanced diet.  · Try to see your caregiver after you have a menstrual period.  · Before seeing your caregiver, make notes about:  · When you have the symptoms.  · What types of symptoms you are having.  · Medications you are taking.  · When and where your last mammogram was taken.  · Past breast problems or breast surgery.  SEEK MEDICAL CARE IF:   · You have been diagnosed with fibrocystic breast changes, and you develop changes in your breast:  · Discharge from the nipple, especially bloody discharge.  · Pain in the breast that does not go away after your menstrual period.  · New lumps or bumps in the breast.  · Lumps in your armpit.  · Your breast or breasts become enlarged, red, and painful.  · You find an isolated lump, even if it is not tender.  · You have questions about this condition that have not been answered.  Document Released: 07/30/2006 Document Revised: 01/05/2012 Document Reviewed: 10/24/2009  [employer]® Patient Information ©2013 [employer].

## 2013-02-13 ENCOUNTER — Encounter: Payer: Self-pay | Admitting: Family Medicine

## 2013-02-13 DIAGNOSIS — M25511 Pain in right shoulder: Secondary | ICD-10-CM | POA: Insufficient documentation

## 2013-02-13 DIAGNOSIS — M25512 Pain in left shoulder: Secondary | ICD-10-CM | POA: Insufficient documentation

## 2013-02-13 HISTORY — DX: Pain in right shoulder: M25.511

## 2013-02-13 NOTE — Assessment & Plan Note (Addendum)
Takes Lovastatin routinely, avoid trans fats and continue fatty acid supplements.

## 2013-02-13 NOTE — Assessment & Plan Note (Signed)
Worse in the evening and symmetric, no injury or swelling. Encouraged fatty acids, NSAIDs and consider referral if worsens

## 2013-02-13 NOTE — Progress Notes (Signed)
Patient ID: Tricia Fleming, female   DOB: 08/20/1951, 62 y.o.   MRN: [id number] Tricia Fleming [id number] 1951/02/25 02/13/2013      Progress Note-Follow Up  Subjective  Chief Complaint  Chief Complaint  Patient presents with  . Shoulder Pain    both shoulders worse in evening X 4-6 months/ labs-fasting    HPI  Patient is a 62 yo caucasian female in today to reestablish care. She had moved to [place] but has now moved back. She is noting 4-6 months worth of b/l shoulder pain, No swelling,redness, radiculopathy, injury or weakness. No similar history of the same, has not tried any meds for this so far. She has been under a great deal of stress due to all the moves but is otherwise good. No othe c/o. No CP/palp/SOB/GI or GU c/o.   Past Medical History  Diagnosis Date  . Hyperlipidemia   . Anxiety   . Preventative health care 06/27/2011  . Depression with anxiety 06/27/2011  . Depression   . Breast lesion 06/18/2010    Qualifier: Diagnosis of  By: Abner Greenspan MD, Misty Stanley    . Shoulder pain, bilateral 02/13/2013    History reviewed. No pertinent past surgical history.  Family History  Problem Relation Age of Onset  . Cancer Father     metastic prostate  . Diabetes Mother   . Hypertension Mother   . Coronary artery disease Mother   . Transient ischemic attack Sister   . Heart disease Brother   . Benign prostatic hyperplasia Brother   . Insulin resistance Brother   . Hypertension Sister   . Benign prostatic hyperplasia Brother   . Alcohol abuse Brother   . Arthritis Sister   . Esophageal varices Brother   . Cirrhosis Brother   . Diabetes Maternal Grandmother   . Coronary artery disease Maternal Grandmother   . Diabetes Maternal Grandfather     History   Social History  . Marital Status: Married    Spouse Name: N/A    Number of Children: N/A  . Years of Education: N/A   Occupational History  . Not on file.   Social History Main Topics  . Smoking status: Never  Smoker   . Smokeless tobacco: Never Used  . Alcohol Use: 0.0 oz/week    0 drink(s) per week     Comment: a couple beers a week  . Drug Use: No  . Sexually Active: Yes -- Female partner(s)   Other Topics Concern  . Not on file   Social History Narrative  . No narrative on file    Current Outpatient Prescriptions on File Prior to Visit  Medication Sig Dispense Refill  . fish oil-omega-3 fatty acids 1000 MG capsule Take 1,000 mg by mouth daily. 2 caps        No current facility-administered medications on file prior to visit.    No Known Allergies  Review of Systems  Review of Systems  Constitutional: Positive for malaise/fatigue. Negative for fever.  HENT: Negative for congestion.   Eyes: Negative for discharge.  Respiratory: Negative for shortness of breath.   Cardiovascular: Negative for chest pain, palpitations and leg swelling.  Gastrointestinal: Negative for nausea, abdominal pain and diarrhea.  Genitourinary: Negative for dysuria.  Musculoskeletal: Positive for joint pain. Negative for falls.  Skin: Negative for rash.  Neurological: Negative for loss of consciousness and headaches.  Endo/Heme/Allergies: Negative for polydipsia.  Psychiatric/Behavioral: Positive for depression. Negative for suicidal ideas. The patient is nervous/anxious. The patient  does not have insomnia.     Objective  BP 138/70  Pulse 102  Temp(Src) 98 F (36.7 C) (Temporal)  Ht 5' 1.5" (1.562 m)  Wt 149 lb 1.9 oz (67.64 kg)  BMI 27.72 kg/m2  SpO2 95%  Physical Exam  Physical Exam  Constitutional: She is oriented to person, place, and time and well-developed, well-nourished, and in no distress. No distress.  HENT:  Head: Normocephalic and atraumatic.  Eyes: Conjunctivae are normal.  Neck: Neck supple. No thyromegaly present.  Cardiovascular: Normal rate, regular rhythm and normal heart sounds.   No murmur heard. Pulmonary/Chest: Effort normal and breath sounds normal. She has no  wheezes.  Abdominal: She exhibits no distension and no mass.  Musculoskeletal: She exhibits no edema.  Lymphadenopathy:    She has no cervical adenopathy.  Neurological: She is alert and oriented to person, place, and time.  Skin: Skin is warm and dry. No rash noted. She is not diaphoretic.  Psychiatric: Memory, affect and judgment normal.    Lab Results  Component Value Date   TSH 1.20 02/09/2013   Lab Results  Component Value Date   WBC 5.5 02/09/2013   HGB 15.0 02/09/2013   HCT 44.3 02/09/2013   MCV 86.0 02/09/2013   PLT 247.0 02/09/2013   Lab Results  Component Value Date   CREATININE 0.9 02/09/2013   BUN 15 02/09/2013   NA 141 02/09/2013   K 4.9 02/09/2013   CL 101 02/09/2013   CO2 32 02/09/2013   Lab Results  Component Value Date   ALT 35 02/09/2013   AST 28 02/09/2013   ALKPHOS 44 02/09/2013   BILITOT 0.8 02/09/2013   Lab Results  Component Value Date   CHOL 210* 02/09/2013   Lab Results  Component Value Date   HDL 50.30 02/09/2013   Lab Results  Component Value Date   LDLCALC 109* 06/30/2012   Lab Results  Component Value Date   TRIG 156.0* 02/09/2013   Lab Results  Component Value Date   CHOLHDL 4 02/09/2013     Assessment & Plan  ELEVATED BP READING WITHOUT DX HYPERTENSION Adequately controlled today no changes.  MIXED HYPERLIPIDEMIA Takes Lovastatin routinely, avoid trans fats and continue fatty acid supplements.  Depression with anxiety Given refill on Alprazolam to use prn  Breast lesion 1 x 2 cm firm lesion at 1 oclock in left breast. Patient had a MGM in October 2013 that did show some fibro glandular changes but no other concerning lesions. Will monitor and if patient believes it is growing or new symptoms develop will refer for further consideration. Discussed options with patient  Shoulder pain, bilateral Worse in the evening and symmetric, no injury or swelling. Encouraged fatty acids, NSAIDs and consider referral if worsens

## 2013-02-13 NOTE — Assessment & Plan Note (Addendum)
1 x 2 cm firm lesion at 1 oclock in left breast. Patient had a MGM in October 2013 that did show some fibro glandular changes but no other concerning lesions. Will monitor and if patient believes it is growing or new symptoms develop will refer for further consideration. Discussed options with patient

## 2013-02-13 NOTE — Assessment & Plan Note (Signed)
Given refill on Alprazolam to use prn

## 2013-02-13 NOTE — Assessment & Plan Note (Signed)
Adequately controlled today no changes.

## 2013-03-10 ENCOUNTER — Telehealth: Payer: Self-pay | Admitting: Family Medicine

## 2013-03-10 NOTE — Telephone Encounter (Signed)
WHEN THE PATIENT WAS IN IN April SHE UNDERSTOOD THAT SHE COULD HAVE THE LUMP TESTED RATHER THAN JUST WATCHING IT FOR 3 MONTHS.  SHE WOULD LIKE TO COME SOONER THAN July TO HAVE THIS LOOKED AT AND PERHAPS TESTED.

## 2013-03-10 NOTE — Telephone Encounter (Signed)
So I do not do the biopsy myself but could examine it and send for biopsy.

## 2013-03-10 NOTE — Telephone Encounter (Signed)
See note

## 2013-03-10 NOTE — Telephone Encounter (Signed)
Please advise 

## 2013-03-11 NOTE — Telephone Encounter (Signed)
Left a message for pt to return call 

## 2013-03-11 NOTE — Telephone Encounter (Signed)
Pt states that MD already examined this? Pt doesn't understand why she needs to come back in? Please advise?  Pt aware of MD being out of the office until Monday

## 2013-03-11 NOTE — Telephone Encounter (Signed)
I misunderstood I thought this was a finding from a new image. It is in my last note. I guess I just need to understand if it is just bothering her because it worries her or is it enlarging? I will then place the referral. She does not need to come back in.

## 2013-03-14 ENCOUNTER — Other Ambulatory Visit: Payer: Self-pay | Admitting: Family Medicine

## 2013-03-14 DIAGNOSIS — N63 Unspecified lump in unspecified breast: Secondary | ICD-10-CM

## 2013-03-14 NOTE — Telephone Encounter (Signed)
It simply helps me place a better referral I can include in the referral any changes patient has noted since her visit so they have the most up to date information. I have this in my note already and am very happy to make the referral. If she does not want to provide further information she certainly does not have to

## 2013-03-14 NOTE — Telephone Encounter (Signed)
Pt informed and states she would like to know what difference it makes? Pt states that MD was going to do the referral when she was in the office but pt stated she wasn't going to be in [place] then. Pt states that its only been about a month so she can't say if its getting bigger or if its worrying her. Pt doesn't understand why it matters to make the referral? Pt would also like to know what MDs opinion is about this? Please advise?

## 2013-03-14 NOTE — Telephone Encounter (Signed)
Referral ordered

## 2013-03-14 NOTE — Telephone Encounter (Signed)
Pt informed and would like to proceed with the referral

## 2013-03-22 ENCOUNTER — Ambulatory Visit (INDEPENDENT_AMBULATORY_CARE_PROVIDER_SITE_OTHER): Payer: BC Managed Care – PPO | Admitting: Surgery

## 2013-03-22 ENCOUNTER — Encounter (INDEPENDENT_AMBULATORY_CARE_PROVIDER_SITE_OTHER): Payer: Self-pay | Admitting: Surgery

## 2013-03-22 VITALS — BP 136/76 | HR 100 | Temp 97.2°F | Resp 16 | Ht 62.0 in | Wt 160.2 lb

## 2013-03-22 DIAGNOSIS — N6320 Unspecified lump in the left breast, unspecified quadrant: Secondary | ICD-10-CM | POA: Insufficient documentation

## 2013-03-22 DIAGNOSIS — N63 Unspecified lump in unspecified breast: Secondary | ICD-10-CM

## 2013-03-22 NOTE — Progress Notes (Signed)
Patient ID: Tricia Fleming, female   DOB: Oct 04, 1951, 62 y.o.   MRN: [id number]  Chief Complaint  Patient presents with  . New Evaluation    eval abnormal mgm/breast mass    HPI Tricia Fleming is a 62 y.o. female.  Referred by Dr. Danise Edge for evaluation of left breast mass  HPI This patient underwent routine screening mammogram in September of 2013. There was a question of a left breast mass so the patient underwent diagnostic mammogram in October. This did not show any additional findings. Over the last several months the patient has been in the process of moving out of state and then moving back to [place]. She has developed a palpable mass in her left upper outer quadrant. She denies any pain in this area. She does feel that the mass slowly enlarging. She denies any nipple discharge. She has not had any further imaging since October.  Past Medical History  Diagnosis Date  . Hyperlipidemia   . Anxiety   . Preventative health care 06/27/2011  . Depression with anxiety 06/27/2011  . Depression   . Breast lesion 06/18/2010    Qualifier: Diagnosis of  By: Abner Greenspan MD, Misty Stanley    . Shoulder pain, bilateral 02/13/2013    History reviewed. No pertinent past surgical history.  Family History  Problem Relation Age of Onset  . Cancer Father     metastic prostate  . Diabetes Mother   . Hypertension Mother   . Coronary artery disease Mother   . Transient ischemic attack Sister   . Heart disease Brother   . Benign prostatic hyperplasia Brother   . Insulin resistance Brother   . Hypertension Sister   . Benign prostatic hyperplasia Brother   . Alcohol abuse Brother   . Arthritis Sister   . Esophageal varices Brother   . Cirrhosis Brother   . Diabetes Maternal Grandmother   . Coronary artery disease Maternal Grandmother   . Diabetes Maternal Grandfather   . Cancer Maternal Aunt   . Birth defects Paternal Aunt     breast    Social History History  Substance Use Topics  .  Smoking status: Never Smoker   . Smokeless tobacco: Never Used  . Alcohol Use: 0.0 oz/week    0 drink(s) per week     Comment: a couple beers a week    No Known Allergies  Current Outpatient Prescriptions  Medication Sig Dispense Refill  . ALPRAZolam (XANAX) 1 MG tablet Take 1 tablet (1 mg total) by mouth 2 (two) times daily as needed for sleep or anxiety.  60 tablet  2  . fish oil-omega-3 fatty acids 1000 MG capsule Take 1,000 mg by mouth daily. 2 caps       . lovastatin (MEVACOR) 40 MG tablet Take 1 tablet (40 mg total) by mouth at bedtime.  90 tablet  3  . zolpidem (AMBIEN) 10 MG tablet Take 1 tablet (10 mg total) by mouth at bedtime as needed for sleep.  30 tablet  2   No current facility-administered medications for this visit.   Menarche age 62 First pregnancy age 12 She did not breast-feed Menopause around age 71 No hormone usage Family history positive for breast cancer in maternal and paternal aunts.  Review of Systems Review of Systems  Constitutional: Negative for fever, chills and unexpected weight change.  HENT: Negative for hearing loss, congestion, sore throat, trouble swallowing and voice change.   Eyes: Negative for visual disturbance.  Respiratory: Negative for  cough and wheezing.   Cardiovascular: Negative for chest pain, palpitations and leg swelling.  Gastrointestinal: Negative for nausea, vomiting, abdominal pain, diarrhea, constipation, blood in stool, abdominal distention and anal bleeding.  Genitourinary: Negative for hematuria, vaginal bleeding and difficulty urinating.  Musculoskeletal: Negative for arthralgias.  Skin: Negative for rash and wound.  Neurological: Negative for seizures, syncope and headaches.  Hematological: Negative for adenopathy. Does not bruise/bleed easily.  Psychiatric/Behavioral: Negative for confusion.    Blood pressure 136/76, pulse 100, temperature 97.2 F (36.2 C), temperature source Temporal, resp. rate 16, height 5\' 2"   (1.575 m), weight 160 lb 3.2 oz (72.666 kg).  Physical Exam Physical Exam WDWN in NAD Breasts - symmetric; no nipple retraction or discharge No axillary lymphadenopathy No supraclavicular lymphadenopathy No palpable masses in right breast Left breast - 2.5 cm oval-shaped palpable mass in the left upper outer quadrant about 3 cm outside the nipple  Data Reviewed none  Assessment    Palpable left breast mass     Plan    Diagnostic mammogram/ ultrasound/ core biopsy of the left breast mass.  Return after the biopsy to discuss findings and to plan possible surgery.          Jennessy Sandridge K. 03/22/2013, 4:33 PM

## 2013-04-01 ENCOUNTER — Ambulatory Visit
Admission: RE | Admit: 2013-04-01 | Discharge: 2013-04-01 | Disposition: A | Discharge status: Home / Self Care | Payer: BC Managed Care – PPO | Source: Ambulatory Visit | Attending: Surgery | Admitting: Surgery

## 2013-04-01 ENCOUNTER — Other Ambulatory Visit (INDEPENDENT_AMBULATORY_CARE_PROVIDER_SITE_OTHER): Payer: Self-pay | Admitting: Surgery

## 2013-04-01 ENCOUNTER — Other Ambulatory Visit (HOSPITAL_COMMUNITY): Payer: Self-pay | Admitting: Diagnostic Radiology

## 2013-04-01 DIAGNOSIS — N6320 Unspecified lump in the left breast, unspecified quadrant: Secondary | ICD-10-CM

## 2013-04-05 ENCOUNTER — Telehealth (INDEPENDENT_AMBULATORY_CARE_PROVIDER_SITE_OTHER): Payer: Self-pay | Admitting: General Surgery

## 2013-04-05 NOTE — Telephone Encounter (Signed)
Talked to patient husband and ask him to tell [name] to call back here to ask for Pattricia Boss, need a apt with Dr Corliss Skains

## 2013-04-05 NOTE — Telephone Encounter (Signed)
Message copied by Wilder Glade on Tue Apr 05, 2013  9:15 AM ------      Message from: Tricia Fleming      Created: Mon Apr 04, 2013  8:17 PM       I need to see her for a follow-up in the next 1-2 weeks       ------

## 2013-04-06 ENCOUNTER — Ambulatory Visit (INDEPENDENT_AMBULATORY_CARE_PROVIDER_SITE_OTHER): Payer: BC Managed Care – PPO | Admitting: Surgery

## 2013-04-06 ENCOUNTER — Encounter (INDEPENDENT_AMBULATORY_CARE_PROVIDER_SITE_OTHER): Payer: Self-pay | Admitting: Surgery

## 2013-04-06 ENCOUNTER — Encounter (INDEPENDENT_AMBULATORY_CARE_PROVIDER_SITE_OTHER): Payer: BC Managed Care – PPO | Admitting: Surgery

## 2013-04-06 VITALS — BP 122/82 | HR 108 | Temp 97.0°F | Resp 17 | Ht 62.0 in | Wt 161.2 lb

## 2013-04-06 DIAGNOSIS — N649 Disorder of breast, unspecified: Secondary | ICD-10-CM

## 2013-04-06 NOTE — Progress Notes (Signed)
Status post ultrasound-guided left breast biopsy of a 1 cm mass-like area in the 1:00 position of the left breast.  The biopsy revealed benign breast parenchyma with focal fibrocystic changes with usual ductal hyperplasia. This was felt to be concordant with the mammographic and ultrasound findings.  The patient seems to be healing well from her biopsy. She does have some bruising in the breast.  We discussed the findings of the biopsy. Certainly, since she has a palpable area in the breast I do not think that we should ignore this. Our options are to perform an excisional biopsy versus close followup in 3 months to see if there's been any interval change in this area. The patient has decided to pursue close followup. We will reevaluate her after her scheduled annual mammogram in September. She is to call sooner if she palpates any difference in her breast tissue.  Tricia Arms. Corliss Skains, MD, [facility]  General/ Trauma Surgery  04/06/2013 5:48 PM

## 2013-05-09 ENCOUNTER — Other Ambulatory Visit: Payer: Self-pay | Admitting: Family Medicine

## 2013-05-09 DIAGNOSIS — F411 Generalized anxiety disorder: Secondary | ICD-10-CM

## 2013-05-09 DIAGNOSIS — G4700 Insomnia, unspecified: Secondary | ICD-10-CM

## 2013-05-09 MED ORDER — ALPRAZOLAM 1 MG PO TABS: 1.0000 mg | ORAL_TABLET | Freq: Two times a day (BID) | ORAL | Status: DC | PRN

## 2013-05-09 MED ORDER — ZOLPIDEM TARTRATE 10 MG PO TABS: 10.0000 mg | ORAL_TABLET | Freq: Every evening | ORAL | Status: DC | PRN

## 2013-05-09 NOTE — Telephone Encounter (Signed)
Please advise refills? Last RX of Alprazolam was done on 02-09-13 quantity 60 with 2 refills Last RX of Zolpidem was done on 02-09-13 quantity 30 with 2 refills  If ok fax to [phone]

## 2013-05-18 ENCOUNTER — Encounter: Payer: Self-pay | Admitting: Family Medicine

## 2013-05-18 ENCOUNTER — Ambulatory Visit (INDEPENDENT_AMBULATORY_CARE_PROVIDER_SITE_OTHER): Payer: BC Managed Care – PPO | Admitting: Family Medicine

## 2013-05-18 VITALS — BP 142/80 | HR 77 | Temp 98.3°F | Ht 61.5 in | Wt 154.0 lb

## 2013-05-18 DIAGNOSIS — F411 Generalized anxiety disorder: Secondary | ICD-10-CM

## 2013-05-18 DIAGNOSIS — F329 Major depressive disorder, single episode, unspecified: Secondary | ICD-10-CM

## 2013-05-18 DIAGNOSIS — G4700 Insomnia, unspecified: Secondary | ICD-10-CM

## 2013-05-18 DIAGNOSIS — E782 Mixed hyperlipidemia: Secondary | ICD-10-CM

## 2013-05-18 DIAGNOSIS — K219 Gastro-esophageal reflux disease without esophagitis: Secondary | ICD-10-CM

## 2013-05-18 DIAGNOSIS — R030 Elevated blood-pressure reading, without diagnosis of hypertension: Secondary | ICD-10-CM

## 2013-05-18 DIAGNOSIS — F341 Dysthymic disorder: Secondary | ICD-10-CM

## 2013-05-18 DIAGNOSIS — N649 Disorder of breast, unspecified: Secondary | ICD-10-CM

## 2013-05-18 DIAGNOSIS — F32A Depression, unspecified: Secondary | ICD-10-CM

## 2013-05-18 MED ORDER — ALPRAZOLAM 1 MG PO TABS: 1.0000 mg | ORAL_TABLET | Freq: Two times a day (BID) | ORAL | Status: DC | PRN

## 2013-05-18 MED ORDER — ESCITALOPRAM OXALATE 10 MG PO TABS: 10.0000 mg | ORAL_TABLET | Freq: Every day | ORAL | Status: DC

## 2013-05-18 NOTE — Assessment & Plan Note (Addendum)
Well controlled, no changes 

## 2013-05-18 NOTE — Patient Instructions (Addendum)
Next annual exam with GYN labs prior with lipid, renal, cbc, tsh, hepatic, hgba1c   Cholesterol Cholesterol is a white, waxy, fat-like protein needed by your body in small amounts. The liver makes all the cholesterol you need. It is carried from the liver by the blood through the blood vessels. Deposits (plaque) may build up on blood vessel walls. This makes the arteries narrower and stiffer. Plaque increases the risk for heart attack and stroke. You cannot feel your cholesterol level even if it is very high. The only way to know is by a blood test to check your lipid (fats) levels. Once you know your cholesterol levels, you should keep a record of the test results. Work with your caregiver to to keep your levels in the desired range. WHAT THE RESULTS MEAN:  Total cholesterol is a rough measure of all the cholesterol in your blood.  LDL is the so-called bad cholesterol. This is the type that deposits cholesterol in the walls of the arteries. You want this level to be low.  HDL is the good cholesterol because it cleans the arteries and carries the LDL away. You want this level to be high.  Triglycerides are fat that the body can either burn for energy or store. High levels are closely linked to heart disease. DESIRED LEVELS:  Total cholesterol below 200.  LDL below 100 for people at risk, below 70 for very high risk.  HDL above 50 is good, above 60 is best.  Triglycerides below 150. HOW TO LOWER YOUR CHOLESTEROL:  Diet.  Choose fish or white meat chicken and [place], roasted or baked. Limit fatty cuts of red meat, fried foods, and processed meats, such as sausage and lunch meat.  Eat lots of fresh fruits and vegetables. Choose whole grains, beans, pasta, potatoes and cereals.  Use only small amounts of olive, corn or canola oils. Avoid butter, mayonnaise, shortening or palm kernel oils. Avoid foods with trans-fats.  Use skim/nonfat milk and low-fat/nonfat yogurt and cheeses. Avoid  whole milk, cream, ice cream, egg yolks and cheeses. Healthy desserts include angel food cake, ginger snaps, animal crackers, hard candy, popsicles, and low-fat/nonfat frozen yogurt. Avoid pastries, cakes, pies and cookies.  Exercise.  A regular program helps decrease LDL and raises HDL.  Helps with weight control.  Do things that increase your activity level like gardening, walking, or taking the stairs.  Medication.  May be prescribed by your caregiver to help lowering cholesterol and the risk for heart disease.  You may need medicine even if your levels are normal if you have several risk factors. HOME CARE INSTRUCTIONS   Follow your diet and exercise programs as suggested by your caregiver.  Take medications as directed.  Have blood work done when your caregiver feels it is necessary. MAKE SURE YOU:   Understand these instructions.  Will watch your condition.  Will get help right away if you are not doing well or get worse. Document Released: 07/08/2001 Document Revised: 01/05/2012 Document Reviewed: 12/29/2007 [facility] Patient Information 2014 [place], [place].

## 2013-05-18 NOTE — Assessment & Plan Note (Signed)
bx was benign but has very quick, close follow up with [facility] and surgery

## 2013-05-22 NOTE — Assessment & Plan Note (Signed)
Doing well with Zolpidem prn, may continue

## 2013-05-22 NOTE — Assessment & Plan Note (Signed)
No c/o on current meds, avoid offending foods, recommend probiotics daily

## 2013-05-22 NOTE — Progress Notes (Signed)
Patient ID: Tricia Fleming, female   DOB: 02/01/1951, 62 y.o.   MRN: [id number] Tricia Fleming [id number] Apr 06, 1951 05/22/2013      Progress Note-Follow Up  Subjective  Chief Complaint  Chief Complaint  Patient presents with  . Follow-up    3 month    HPI  Patient is a 62 year old Caucasian female in today for followup. Generally doing well. No recent illness. No headaches or chest pain. No shortness of breath palpitations, GI or GU concerns. Is taking lovastatin routinely but only takes alprazolam and zolpidem when necessary with good results.  Past Medical History  Diagnosis Date  . Hyperlipidemia   . Anxiety   . Preventative health care 06/27/2011  . Depression with anxiety 06/27/2011  . Depression   . Breast lesion 06/18/2010    Qualifier: Diagnosis of  By: Abner Greenspan MD, Misty Stanley    . Shoulder pain, bilateral 02/13/2013    History reviewed. No pertinent past surgical history.  Family History  Problem Relation Age of Onset  . Cancer Father     metastic prostate  . Diabetes Mother   . Hypertension Mother   . Coronary artery disease Mother   . Transient ischemic attack Sister   . Heart disease Brother   . Benign prostatic hyperplasia Brother   . Insulin resistance Brother   . Hypertension Sister   . Benign prostatic hyperplasia Brother   . Alcohol abuse Brother   . Arthritis Sister   . Esophageal varices Brother   . Cirrhosis Brother   . Diabetes Maternal Grandmother   . Coronary artery disease Maternal Grandmother   . Diabetes Maternal Grandfather   . Cancer Maternal Aunt   . Birth defects Paternal Aunt     breast    History   Social History  . Marital Status: Married    Spouse Name: N/A    Number of Children: N/A  . Years of Education: N/A   Occupational History  . Not on file.   Social History Main Topics  . Smoking status: Never Smoker   . Smokeless tobacco: Never Used  . Alcohol Use: 0.0 oz/week    0 drink(s) per week     Comment: a couple beers a  week  . Drug Use: No  . Sexually Active: Yes -- Female partner(s)   Other Topics Concern  . Not on file   Social History Narrative  . No narrative on file    Current Outpatient Prescriptions on File Prior to Visit  Medication Sig Dispense Refill  . fish oil-omega-3 fatty acids 1000 MG capsule Take 1,000 mg by mouth daily. 2 caps       . lovastatin (MEVACOR) 40 MG tablet Take 1 tablet (40 mg total) by mouth at bedtime.  90 tablet  3  . zolpidem (AMBIEN) 10 MG tablet Take 1 tablet (10 mg total) by mouth at bedtime as needed for sleep.  30 tablet  2   No current facility-administered medications on file prior to visit.    No Known Allergies  Review of Systems  Review of Systems  Constitutional: Negative for fever and malaise/fatigue.  HENT: Negative for congestion.   Eyes: Negative for pain and discharge.  Respiratory: Negative for shortness of breath.   Cardiovascular: Negative for chest pain, palpitations and leg swelling.  Gastrointestinal: Negative for nausea, abdominal pain and diarrhea.  Genitourinary: Negative for dysuria.  Musculoskeletal: Negative for falls.  Skin: Negative for rash.  Neurological: Negative for loss of consciousness and headaches.  Endo/Heme/Allergies:  Negative for polydipsia.  Psychiatric/Behavioral: Negative for depression and suicidal ideas. The patient is not nervous/anxious and does not have insomnia.     Objective  BP 142/80  Pulse 77  Temp(Src) 98.3 F (36.8 C) (Oral)  Ht 5' 1.5" (1.562 m)  Wt 154 lb (69.854 kg)  BMI 28.63 kg/m2  SpO2 94%  Physical Exam  Physical Exam  Constitutional: She is oriented to person, place, and time and well-developed, well-nourished, and in no distress. No distress.  HENT:  Head: Normocephalic and atraumatic.  Eyes: Conjunctivae are normal.  Neck: Neck supple. No thyromegaly present.  Cardiovascular: Normal rate, regular rhythm and normal heart sounds.  Exam reveals no gallop.   No murmur  heard. Pulmonary/Chest: Effort normal and breath sounds normal. She has no wheezes.  Abdominal: She exhibits no distension and no mass.  Musculoskeletal: She exhibits no edema.  Lymphadenopathy:    She has no cervical adenopathy.  Neurological: She is alert and oriented to person, place, and time.  Skin: Skin is warm and dry. No rash noted. She is not diaphoretic.  Psychiatric: Memory, affect and judgment normal.    Lab Results  Component Value Date   TSH 1.20 02/09/2013   Lab Results  Component Value Date   WBC 5.5 02/09/2013   HGB 15.0 02/09/2013   HCT 44.3 02/09/2013   MCV 86.0 02/09/2013   PLT 247.0 02/09/2013   Lab Results  Component Value Date   CREATININE 0.9 02/09/2013   BUN 15 02/09/2013   NA 141 02/09/2013   K 4.9 02/09/2013   CL 101 02/09/2013   CO2 32 02/09/2013   Lab Results  Component Value Date   ALT 35 02/09/2013   AST 28 02/09/2013   ALKPHOS 44 02/09/2013   BILITOT 0.8 02/09/2013   Lab Results  Component Value Date   CHOL 210* 02/09/2013   Lab Results  Component Value Date   HDL 50.30 02/09/2013   Lab Results  Component Value Date   LDLCALC 109* 06/30/2012   Lab Results  Component Value Date   TRIG 156.0* 02/09/2013   Lab Results  Component Value Date   CHOLHDL 4 02/09/2013     Assessment & Plan  Breast lesion bx was benign but has very quick, close follow up with [facility] and surgery   ELEVATED BP READING WITHOUT DX HYPERTENSION Well controlled, no changes  GERD No c/o on current meds, avoid offending foods, recommend probiotics daily  MIXED HYPERLIPIDEMIA Tolerating statins, avoid trans fats, increase exercise.   INSOMNIA UNSPECIFIED Doing well with Zolpidem prn, may continue

## 2013-05-22 NOTE — Assessment & Plan Note (Signed)
Tolerating statins, avoid trans fats, increase exercise.

## 2013-07-27 ENCOUNTER — Ambulatory Visit (INDEPENDENT_AMBULATORY_CARE_PROVIDER_SITE_OTHER): Payer: BC Managed Care – PPO | Admitting: Family Medicine

## 2013-07-27 ENCOUNTER — Encounter: Payer: Self-pay | Admitting: Family Medicine

## 2013-07-27 VITALS — BP 130/90 | HR 72 | Temp 98.3°F | Ht 61.5 in | Wt 148.1 lb

## 2013-07-27 DIAGNOSIS — G4700 Insomnia, unspecified: Secondary | ICD-10-CM

## 2013-07-27 DIAGNOSIS — F329 Major depressive disorder, single episode, unspecified: Secondary | ICD-10-CM

## 2013-07-27 DIAGNOSIS — R739 Hyperglycemia, unspecified: Secondary | ICD-10-CM

## 2013-07-27 DIAGNOSIS — F419 Anxiety disorder, unspecified: Secondary | ICD-10-CM

## 2013-07-27 DIAGNOSIS — F411 Generalized anxiety disorder: Secondary | ICD-10-CM

## 2013-07-27 DIAGNOSIS — N6320 Unspecified lump in the left breast, unspecified quadrant: Secondary | ICD-10-CM

## 2013-07-27 DIAGNOSIS — Z0000 Encounter for general adult medical examination without abnormal findings: Secondary | ICD-10-CM

## 2013-07-27 DIAGNOSIS — E1169 Type 2 diabetes mellitus with other specified complication: Secondary | ICD-10-CM | POA: Insufficient documentation

## 2013-07-27 DIAGNOSIS — E782 Mixed hyperlipidemia: Secondary | ICD-10-CM

## 2013-07-27 DIAGNOSIS — N63 Unspecified lump in unspecified breast: Secondary | ICD-10-CM

## 2013-07-27 DIAGNOSIS — Z23 Encounter for immunization: Secondary | ICD-10-CM

## 2013-07-27 DIAGNOSIS — F418 Other specified anxiety disorders: Secondary | ICD-10-CM

## 2013-07-27 DIAGNOSIS — Z124 Encounter for screening for malignant neoplasm of cervix: Secondary | ICD-10-CM

## 2013-07-27 DIAGNOSIS — K219 Gastro-esophageal reflux disease without esophagitis: Secondary | ICD-10-CM

## 2013-07-27 DIAGNOSIS — E785 Hyperlipidemia, unspecified: Secondary | ICD-10-CM

## 2013-07-27 DIAGNOSIS — F32A Depression, unspecified: Secondary | ICD-10-CM

## 2013-07-27 DIAGNOSIS — R030 Elevated blood-pressure reading, without diagnosis of hypertension: Secondary | ICD-10-CM

## 2013-07-27 DIAGNOSIS — R7309 Other abnormal glucose: Secondary | ICD-10-CM

## 2013-07-27 DIAGNOSIS — F341 Dysthymic disorder: Secondary | ICD-10-CM

## 2013-07-27 HISTORY — DX: Hyperglycemia, unspecified: R73.9

## 2013-07-27 LAB — RENAL FUNCTION PANEL
BUN: 14 mg/dL (ref 6–23)
Calcium: 9.9 mg/dL (ref 8.4–10.5)
Creat: 0.78 mg/dL (ref 0.50–1.10)
Phosphorus: 3.3 mg/dL (ref 2.3–4.6)

## 2013-07-27 LAB — HEPATIC FUNCTION PANEL
AST: 19 U/L (ref 0–37)
Albumin: 4.8 g/dL (ref 3.5–5.2)
Alkaline Phosphatase: 45 U/L (ref 39–117)
Total Protein: 7.1 g/dL (ref 6.0–8.3)

## 2013-07-27 LAB — CBC
MCH: 28.6 pg (ref 26.0–34.0)
MCHC: 34.4 g/dL (ref 30.0–36.0)
MCV: 83.1 fL (ref 78.0–100.0)
Platelets: 215 10*3/uL (ref 150–400)
RBC: 5.04 MIL/uL (ref 3.87–5.11)

## 2013-07-27 LAB — LIPID PANEL
LDL Cholesterol: 140 mg/dL — ABNORMAL HIGH (ref 0–99)
Total CHOL/HDL Ratio: 4.4 Ratio

## 2013-07-27 MED ORDER — ESCITALOPRAM OXALATE 10 MG PO TABS: 10.0000 mg | ORAL_TABLET | Freq: Every day | ORAL | Status: DC

## 2013-07-27 MED ORDER — ZOLPIDEM TARTRATE 10 MG PO TABS: 10.0000 mg | ORAL_TABLET | Freq: Every evening | ORAL | Status: DC | PRN

## 2013-07-27 MED ORDER — ALPRAZOLAM 1 MG PO TABS: 1.0000 mg | ORAL_TABLET | Freq: Two times a day (BID) | ORAL | Status: DC | PRN

## 2013-07-27 NOTE — Patient Instructions (Addendum)
Probiotic and krill oil, [employer]age and MegaRed  Check with insurance to see if they cover Zostavax (shingles shot)  Preventive Care for Adults, Female A healthy lifestyle and preventive care can promote health and wellness. Preventive health guidelines for women include the following key practices.  A routine yearly physical is a good way to check with your caregiver about your health and preventive screening. It is a chance to share any concerns and updates on your health, and to receive a thorough exam.  Visit your dentist for a routine exam and preventive care every 6 months. Brush your teeth twice a day and floss once a day. Good oral hygiene prevents tooth decay and gum disease.  The frequency of eye exams is based on your age, health, family medical history, use of contact lenses, and other factors. Follow your caregiver's recommendations for frequency of eye exams.  Eat a healthy diet. Foods like vegetables, fruits, whole grains, low-fat dairy products, and lean protein foods contain the nutrients you need without too many calories. Decrease your intake of foods high in solid fats, added sugars, and salt. Eat the right amount of calories for you.Get information about a proper diet from your caregiver, if necessary.  Regular physical exercise is one of the most important things you can do for your health. Most adults should get at least 150 minutes of moderate-intensity exercise (any activity that increases your heart rate and causes you to sweat) each week. In addition, most adults need muscle-strengthening exercises on 2 or more days a week.  Maintain a healthy weight. The body mass index (BMI) is a screening tool to identify possible weight problems. It provides an estimate of body fat based on height and weight. Your caregiver can help determine your BMI, and can help you achieve or maintain a healthy weight.For adults 20 years and older:  A BMI below 18.5 is considered  underweight.  A BMI of 18.5 to 24.9 is normal.  A BMI of 25 to 29.9 is considered overweight.  A BMI of 30 and above is considered obese.  Maintain normal blood lipids and cholesterol levels by exercising and minimizing your intake of saturated fat. Eat a balanced diet with plenty of fruit and vegetables. Blood tests for lipids and cholesterol should begin at age 68 and be repeated every 5 years. If your lipid or cholesterol levels are high, you are over 50, or you are at high risk for heart disease, you may need your cholesterol levels checked more frequently.Ongoing high lipid and cholesterol levels should be treated with medicines if diet and exercise are not effective.  If you smoke, find out from your caregiver how to quit. If you do not use tobacco, do not start.  If you are pregnant, do not drink alcohol. If you are breastfeeding, be very cautious about drinking alcohol. If you are not pregnant and choose to drink alcohol, do not exceed 1 drink per day. One drink is considered to be 12 ounces (355 mL) of beer, 5 ounces (148 mL) of wine, or 1.5 ounces (44 mL) of liquor.  Avoid use of street drugs. Do not share needles with anyone. Ask for help if you need support or instructions about stopping the use of drugs.  High blood pressure causes heart disease and increases the risk of stroke. Your blood pressure should be checked at least every 1 to 2 years. Ongoing high blood pressure should be treated with medicines if weight loss and exercise are not effective.  If you are 57 to 63 years old, ask your caregiver if you should take aspirin to prevent strokes.  Diabetes screening involves taking a blood sample to check your fasting blood sugar level. This should be done once every 3 years, after age 43, if you are within normal weight and without risk factors for diabetes. Testing should be considered at a younger age or be carried out more frequently if you are overweight and have at least 1  risk factor for diabetes.  Breast cancer screening is essential preventive care for women. You should practice "breast self-awareness." This means understanding the normal appearance and feel of your breasts and may include breast self-examination. Any changes detected, no matter how small, should be reported to a caregiver. Women in their 96s and 30s should have a clinical breast exam (CBE) by a caregiver as part of a regular health exam every 1 to 3 years. After age 26, women should have a CBE every year. Starting at age 26, women should consider having a mammography (breast X-ray test) every year. Women who have a family history of breast cancer should talk to their caregiver about genetic screening. Women at a high risk of breast cancer should talk to their caregivers about having magnetic resonance imaging (MRI) and a mammography every year.  The Pap test is a screening test for cervical cancer. A Pap test can show cell changes on the cervix that might become cervical cancer if left untreated. A Pap test is a procedure in which cells are obtained and examined from the lower end of the uterus (cervix).  Women should have a Pap test starting at age 43.  Between ages 71 and 34, Pap tests should be repeated every 2 years.  Beginning at age 53, you should have a Pap test every 3 years as long as the past 3 Pap tests have been normal.  Some women have medical problems that increase the chance of getting cervical cancer. Talk to your caregiver about these problems. It is especially important to talk to your caregiver if a new problem develops soon after your last Pap test. In these cases, your caregiver may recommend more frequent screening and Pap tests.  The above recommendations are the same for women who have or have not gotten the vaccine for human papillomavirus (HPV).  If you had a hysterectomy for a problem that was not cancer or a condition that could lead to cancer, then you no longer need  Pap tests. Even if you no longer need a Pap test, a regular exam is a good idea to make sure no other problems are starting.  If you are between ages 59 and 75, and you have had normal Pap tests going back 10 years, you no longer need Pap tests. Even if you no longer need a Pap test, a regular exam is a good idea to make sure no other problems are starting.  If you have had past treatment for cervical cancer or a condition that could lead to cancer, you need Pap tests and screening for cancer for at least 20 years after your treatment.  If Pap tests have been discontinued, risk factors (such as a new sexual partner) need to be reassessed to determine if screening should be resumed.  The HPV test is an additional test that may be used for cervical cancer screening. The HPV test looks for the virus that can cause the cell changes on the cervix. The cells collected during the Pap test  can be tested for HPV. The HPV test could be used to screen women aged 65 years and older, and should be used in women of any age who have unclear Pap test results. After the age of 24, women should have HPV testing at the same frequency as a Pap test.  Colorectal cancer can be detected and often prevented. Most routine colorectal cancer screening begins at the age of 56 and continues through age 50. However, your caregiver may recommend screening at an earlier age if you have risk factors for colon cancer. On a yearly basis, your caregiver may provide home test kits to check for hidden blood in the stool. Use of a small camera at the end of a tube, to directly examine the colon (sigmoidoscopy or colonoscopy), can detect the earliest forms of colorectal cancer. Talk to your caregiver about this at age 68, when routine screening begins. Direct examination of the colon should be repeated every 5 to 10 years through age 27, unless early forms of pre-cancerous polyps or small growths are found.  Hepatitis C blood testing is  recommended for all people born from [birth year] through 1965 and any individual with known risks for hepatitis C.  Practice safe sex. Use condoms and avoid high-risk sexual practices to reduce the spread of sexually transmitted infections (STIs). STIs include gonorrhea, chlamydia, syphilis, trichomonas, herpes, HPV, and human immunodeficiency virus (HIV). Herpes, HIV, and HPV are viral illnesses that have no cure. They can result in disability, cancer, and death. Sexually active women aged 82 and younger should be checked for chlamydia. Older women with new or multiple partners should also be tested for chlamydia. Testing for other STIs is recommended if you are sexually active and at increased risk.  Osteoporosis is a disease in which the bones lose minerals and strength with aging. This can result in serious bone fractures. The risk of osteoporosis can be identified using a bone density scan. Women ages 41 and over and women at risk for fractures or osteoporosis should discuss screening with their caregivers. Ask your caregiver whether you should take a calcium supplement or vitamin D to reduce the rate of osteoporosis.  Menopause can be associated with physical symptoms and risks. Hormone replacement therapy is available to decrease symptoms and risks. You should talk to your caregiver about whether hormone replacement therapy is right for you.  Use sunscreen with sun protection factor (SPF) of 30 or more. Apply sunscreen liberally and repeatedly throughout the day. You should seek shade when your shadow is shorter than you. Protect yourself by wearing long sleeves, pants, a wide-brimmed hat, and sunglasses year round, whenever you are outdoors.  Once a month, do a whole body skin exam, using a mirror to look at the skin on your back. Notify your caregiver of new moles, moles that have irregular borders, moles that are larger than a pencil eraser, or moles that have changed in shape or color.  Stay current  with required immunizations.  Influenza. You need a dose every fall (or winter). The composition of the flu vaccine changes each year, so being vaccinated once is not enough.  Pneumococcal polysaccharide. You need 1 to 2 doses if you smoke cigarettes or if you have certain chronic medical conditions. You need 1 dose at age 56 (or older) if you have never been vaccinated.  Tetanus, diphtheria, pertussis (Tdap, Td). Get 1 dose of Tdap vaccine if you are younger than age 36, are over 10 and have contact with an infant,  are a Research scientist (physical sciences), are pregnant, or simply want to be protected from whooping cough. After that, you need a Td booster dose every 10 years. Consult your caregiver if you have not had at least 3 tetanus and diphtheria-containing shots sometime in your life or have a deep or dirty wound.  HPV. You need this vaccine if you are a woman age 4 or younger. The vaccine is given in 3 doses over 6 months.  Measles, mumps, rubella (MMR). You need at least 1 dose of MMR if you were born in 1957 or later. You may also need a second dose.  Meningococcal. If you are age 68 to 35 and a first-year college student living in a residence hall, or have one of several medical conditions, you need to get vaccinated against meningococcal disease. You may also need additional booster doses.  Zoster (shingles). If you are age 58 or older, you should get this vaccine.  Varicella (chickenpox). If you have never had chickenpox or you were vaccinated but received only 1 dose, talk to your caregiver to find out if you need this vaccine.  Hepatitis A. You need this vaccine if you have a specific risk factor for hepatitis A virus infection or you simply wish to be protected from this disease. The vaccine is usually given as 2 doses, 6 to 18 months apart.  Hepatitis B. You need this vaccine if you have a specific risk factor for hepatitis B virus infection or you simply wish to be protected from this disease.  The vaccine is given in 3 doses, usually over 6 months. Preventive Services / Frequency Ages 12 to 32  Blood pressure check.** / Every 1 to 2 years.  Lipid and cholesterol check.** / Every 5 years beginning at age 65.  Clinical breast exam.** / Every 3 years for women in their 71s and 30s.  Pap test.** / Every 2 years from ages 60 through 29. Every 3 years starting at age 59 through age 57 or 64 with a history of 3 consecutive normal Pap tests.  HPV screening.** / Every 3 years from ages 8 through ages 53 to 15 with a history of 3 consecutive normal Pap tests.  Hepatitis C blood test.** / For any individual with known risks for hepatitis C.  Skin self-exam. / Monthly.  Influenza immunization.** / Every year.  Pneumococcal polysaccharide immunization.** / 1 to 2 doses if you smoke cigarettes or if you have certain chronic medical conditions.  Tetanus, diphtheria, pertussis (Tdap, Td) immunization. / A one-time dose of Tdap vaccine. After that, you need a Td booster dose every 10 years.  HPV immunization. / 3 doses over 6 months, if you are 2 and younger.  Measles, mumps, rubella (MMR) immunization. / You need at least 1 dose of MMR if you were born in 1957 or later. You may also need a second dose.  Meningococcal immunization. / 1 dose if you are age 67 to 60 and a first-year college student living in a residence hall, or have one of several medical conditions, you need to get vaccinated against meningococcal disease. You may also need additional booster doses.  Varicella immunization.** / Consult your caregiver.  Hepatitis A immunization.** / Consult your caregiver. 2 doses, 6 to 18 months apart.  Hepatitis B immunization.** / Consult your caregiver. 3 doses usually over 6 months. Ages 36 to 55  Blood pressure check.** / Every 1 to 2 years.  Lipid and cholesterol check.** / Every 5 years beginning at  age 45.  Clinical breast exam.** / Every year after age  80.  Mammogram.** / Every year beginning at age 46 and continuing for as long as you are in good health. Consult with your caregiver.  Pap test.** / Every 3 years starting at age 74 through age 78 or 43 with a history of 3 consecutive normal Pap tests.  HPV screening.** / Every 3 years from ages 40 through ages 61 to 62 with a history of 3 consecutive normal Pap tests.  Fecal occult blood test (FOBT) of stool. / Every year beginning at age 31 and continuing until age 56. You may not need to do this test if you get a colonoscopy every 10 years.  Flexible sigmoidoscopy or colonoscopy.** / Every 5 years for a flexible sigmoidoscopy or every 10 years for a colonoscopy beginning at age 3 and continuing until age 67.  Hepatitis C blood test.** / For all people born from [birth year] through 1966 and any individual with known risks for hepatitis C.  Skin self-exam. / Monthly.  Influenza immunization.** / Every year.  Pneumococcal polysaccharide immunization.** / 1 to 2 doses if you smoke cigarettes or if you have certain chronic medical conditions.  Tetanus, diphtheria, pertussis (Tdap, Td) immunization.** / A one-time dose of Tdap vaccine. After that, you need a Td booster dose every 10 years.  Measles, mumps, rubella (MMR) immunization. / You need at least 1 dose of MMR if you were born in 1957 or later. You may also need a second dose.  Varicella immunization.** / Consult your caregiver.  Meningococcal immunization.** / Consult your caregiver.  Hepatitis A immunization.** / Consult your caregiver. 2 doses, 6 to 18 months apart.  Hepatitis B immunization.** / Consult your caregiver. 3 doses, usually over 6 months. Ages 38 and over  Blood pressure check.** / Every 1 to 2 years.  Lipid and cholesterol check.** / Every 5 years beginning at age 15.  Clinical breast exam.** / Every year after age 3.  Mammogram.** / Every year beginning at age 25 and continuing for as long as you are in good  health. Consult with your caregiver.  Pap test.** / Every 3 years starting at age 34 through age 32 or 59 with a 3 consecutive normal Pap tests. Testing can be stopped between 65 and 70 with 3 consecutive normal Pap tests and no abnormal Pap or HPV tests in the past 10 years.  HPV screening.** / Every 3 years from ages 65 through ages 55 or 63 with a history of 3 consecutive normal Pap tests. Testing can be stopped between 65 and 70 with 3 consecutive normal Pap tests and no abnormal Pap or HPV tests in the past 10 years.  Fecal occult blood test (FOBT) of stool. / Every year beginning at age 76 and continuing until age 61. You may not need to do this test if you get a colonoscopy every 10 years.  Flexible sigmoidoscopy or colonoscopy.** / Every 5 years for a flexible sigmoidoscopy or every 10 years for a colonoscopy beginning at age 4 and continuing until age 22.  Hepatitis C blood test.** / For all people born from [birth year] through 1966 and any individual with known risks for hepatitis C.  Osteoporosis screening.** / A one-time screening for women ages 66 and over and women at risk for fractures or osteoporosis.  Skin self-exam. / Monthly.  Influenza immunization.** / Every year.  Pneumococcal polysaccharide immunization.** / 1 dose at age 82 (or older) if you have  never been vaccinated.  Tetanus, diphtheria, pertussis (Tdap, Td) immunization. / A one-time dose of Tdap vaccine if you are over 65 and have contact with an infant, are a Research scientist (physical sciences), or simply want to be protected from whooping cough. After that, you need a Td booster dose every 10 years.  Varicella immunization.** / Consult your caregiver.  Meningococcal immunization.** / Consult your caregiver.  Hepatitis A immunization.** / Consult your caregiver. 2 doses, 6 to 18 months apart.  Hepatitis B immunization.** / Check with your caregiver. 3 doses, usually over 6 months. ** Family history and personal history of risk and  conditions may change your caregiver's recommendations. Document Released: 12/09/2001 Document Revised: 01/05/2012 Document Reviewed: 03/10/2011 [facility] Patient Information 2014 [place], [place].

## 2013-07-27 NOTE — Assessment & Plan Note (Addendum)
Pap today, no concerns on exam.  

## 2013-07-31 NOTE — Assessment & Plan Note (Signed)
Mild, minimize simple carbs and maintain activity

## 2013-07-31 NOTE — Progress Notes (Signed)
Patient ID: Tricia Fleming, female   DOB: 10/10/1951, 62 y.o.   MRN: [id number] Tricia Fleming [id number] 09/19/1951 07/31/2013      Progress Note-Follow Up  Subjective  Chief Complaint  Chief Complaint  Patient presents with  . Annual Exam    physical  . Injections    flu and Prevnar  . Gynecologic Exam    pap    HPI  Patient is a 62 year old Caucasian female who is here today for routine medical care. She is continuing to struggle with ongoing significant stress and insomnia but feels she is managing well. Would like to maintain her sleep medications but has chosen not to her antianxiety and depression. She feels she does not need medications at this time and denies any suicidal ideation and has only minimal anhedonia. He is here today for GYN exam and has no GYN complaints. Has not had any new or lesions. Denies recent illness, fevers, headache, chest pain, palpitations, shortness of breath, GI or GU complaints  Past Medical History  Diagnosis Date  . Hyperlipidemia   . Anxiety   . Preventative health care 06/27/2011  . Depression with anxiety 06/27/2011  . Depression   . Breast lesion 06/18/2010    Qualifier: Diagnosis of  By: Abner Greenspan MD, Misty Stanley    . Shoulder pain, bilateral 02/13/2013  . Hyperglycemia 07/27/2013    Past Surgical History  Procedure Laterality Date  . Tubal ligation      Family History  Problem Relation Age of Onset  . Cancer Father     metastic prostate  . Diabetes Mother   . Hypertension Mother   . Coronary artery disease Mother   . Transient ischemic attack Sister   . Heart disease Brother   . Benign prostatic hyperplasia Brother   . Lung disease Brother     in a smoker  . Insulin resistance Brother   . Hypertension Sister   . Benign prostatic hyperplasia Brother   . Alcohol abuse Brother   . Arthritis Sister   . Esophageal varices Brother   . Cirrhosis Brother   . Diabetes Maternal Grandmother   . Coronary artery disease Maternal  Grandmother   . Diabetes Maternal Grandfather   . Cancer Maternal Aunt   . Birth defects Paternal Aunt     breast    History   Social History  . Marital Status: Married    Spouse Name: N/A    Number of Children: N/A  . Years of Education: N/A   Occupational History  . Not on file.   Social History Main Topics  . Smoking status: Never Smoker   . Smokeless tobacco: Never Used  . Alcohol Use: 0.0 oz/week    0 drink(s) per week     Comment: a couple beers a week  . Drug Use: No  . Sexual Activity: Yes    Partners: Male   Other Topics Concern  . Not on file   Social History Narrative  . No narrative on file    Current Outpatient Prescriptions on File Prior to Visit  Medication Sig Dispense Refill  . fish oil-omega-3 fatty acids 1000 MG capsule Take 1,000 mg by mouth daily. 2 caps       . lovastatin (MEVACOR) 40 MG tablet Take 1 tablet (40 mg total) by mouth at bedtime.  90 tablet  3   No current facility-administered medications on file prior to visit.    No Known Allergies  Review of Systems  Review of Systems  Constitutional: Negative for fever and malaise/fatigue.  HENT: Negative for congestion.   Eyes: Negative for discharge.  Respiratory: Negative for shortness of breath.   Cardiovascular: Negative for chest pain, palpitations and leg swelling.  Gastrointestinal: Negative for nausea, abdominal pain and diarrhea.  Genitourinary: Negative for dysuria.  Musculoskeletal: Negative for falls.  Skin: Negative for rash.  Neurological: Negative for loss of consciousness and headaches.  Endo/Heme/Allergies: Negative for polydipsia.  Psychiatric/Behavioral: Negative for depression and suicidal ideas. The patient is nervous/anxious and has insomnia.     Objective  BP 130/90  Pulse 72  Temp(Src) 98.3 F (36.8 C) (Oral)  Ht 5' 1.5" (1.562 m)  Wt 148 lb 1.9 oz (67.187 kg)  BMI 27.54 kg/m2  SpO2 97%  Physical Exam  Physical Exam  Constitutional: She is  oriented to person, place, and time and well-developed, well-nourished, and in no distress. No distress.  HENT:  Head: Normocephalic and atraumatic.  Right Ear: External ear normal.  Left Ear: External ear normal.  Nose: Nose normal.  Mouth/Throat: Oropharynx is clear and moist. No oropharyngeal exudate.  Eyes: Conjunctivae are normal. Pupils are equal, round, and reactive to light. Right eye exhibits no discharge. Left eye exhibits no discharge. No scleral icterus.  Neck: Normal range of motion. Neck supple. No thyromegaly present.  Cardiovascular: Normal rate, regular rhythm, normal heart sounds and intact distal pulses.   No murmur heard. Pulmonary/Chest: Effort normal and breath sounds normal. No respiratory distress. She has no wheezes. She has no rales.  Abdominal: Soft. Bowel sounds are normal. She exhibits no distension and no mass. There is no tenderness.  Genitourinary: Uterus normal, cervix normal, right adnexa normal and left adnexa normal. No vaginal discharge found.  Musculoskeletal: Normal range of motion. She exhibits no edema and no tenderness.  Lymphadenopathy:    She has no cervical adenopathy.  Neurological: She is alert and oriented to person, place, and time. She has normal reflexes. No cranial nerve deficit. Coordination normal.  Skin: Skin is warm and dry. No rash noted. She is not diaphoretic.  Psychiatric: Mood, memory and affect normal.    Lab Results  Component Value Date   TSH 1.337 07/27/2013   Lab Results  Component Value Date   WBC 4.8 07/27/2013   HGB 14.4 07/27/2013   HCT 41.9 07/27/2013   MCV 83.1 07/27/2013   PLT 215 07/27/2013   Lab Results  Component Value Date   CREATININE 0.78 07/27/2013   BUN 14 07/27/2013   NA 138 07/27/2013   K 4.6 07/27/2013   CL 98 07/27/2013   CO2 31 07/27/2013   Lab Results  Component Value Date   ALT 27 07/27/2013   AST 19 07/27/2013   ALKPHOS 45 07/27/2013   BILITOT 0.6 07/27/2013   Lab Results  Component Value Date    CHOL 216* 07/27/2013   Lab Results  Component Value Date   HDL 49 07/27/2013   Lab Results  Component Value Date   LDLCALC 140* 07/27/2013   Lab Results  Component Value Date   TRIG 134 07/27/2013   Lab Results  Component Value Date   CHOLHDL 4.4 07/27/2013     Assessment & Plan  Cervical cancer screening Pap today, no concerns on exam  INSOMNIA UNSPECIFIED Discussed need to try and cut down on and minimize medications for sleep may try supplementing with Melatonin and discussed need for good sleep hygiene  GERD Well controlled with dietary changes  MIXED HYPERLIPIDEMIA Tolerating Lovasatin, avoid trans fats.  ELEVATED BP READING WITHOUT DX HYPERTENSION Adequate control today, minimize sodium  Left breast mass Is following with the breast center, previous imaging was negative for any concerns, we will proceed with annual screening MGM  Hyperglycemia Mild, minimize simple carbs and maintain activity  Depression with anxiety She feels she is doing well at this time, declines further meds.  Preventative health care Given flu shot and pneumonia today, encouraged heart healthy diet and regular exercise

## 2013-07-31 NOTE — Assessment & Plan Note (Signed)
She feels she is doing well at this time, declines further meds.

## 2013-07-31 NOTE — Assessment & Plan Note (Signed)
Given flu shot and pneumonia today, encouraged heart healthy diet and regular exercise

## 2013-07-31 NOTE — Assessment & Plan Note (Signed)
Discussed need to try and cut down on and minimize medications for sleep may try supplementing with Melatonin and discussed need for good sleep hygiene

## 2013-07-31 NOTE — Assessment & Plan Note (Signed)
Tolerating Lovasatin, avoid trans fats.

## 2013-07-31 NOTE — Assessment & Plan Note (Signed)
>>  ASSESSMENT AND PLAN FOR MIXED DIABETIC HYPERLIPIDEMIA ASSOCIATED WITH TYPE 2 DIABETES MELLITUS (HCC) WRITTEN ON 07/31/2013  7:11 PM BY BLYTH, STACEY A, MD  Mild, minimize simple carbs and maintain activity

## 2013-07-31 NOTE — Assessment & Plan Note (Signed)
Adequate control today, minimize sodium

## 2013-07-31 NOTE — Assessment & Plan Note (Signed)
Well-controlled with dietary changes. 

## 2013-07-31 NOTE — Assessment & Plan Note (Signed)
Is following with the breast center, previous imaging was negative for any concerns, we will proceed with annual screening MGM

## 2013-08-02 ENCOUNTER — Other Ambulatory Visit: Payer: Self-pay

## 2013-08-02 DIAGNOSIS — Z1231 Encounter for screening mammogram for malignant neoplasm of breast: Secondary | ICD-10-CM

## 2013-08-03 ENCOUNTER — Ambulatory Visit
Admission: RE | Admit: 2013-08-03 | Discharge: 2013-08-03 | Disposition: A | Discharge status: Home / Self Care | Payer: BC Managed Care – PPO | Source: Ambulatory Visit

## 2013-08-03 DIAGNOSIS — Z1231 Encounter for screening mammogram for malignant neoplasm of breast: Secondary | ICD-10-CM

## 2013-10-31 ENCOUNTER — Telehealth: Payer: Self-pay | Admitting: Family Medicine

## 2013-10-31 DIAGNOSIS — F411 Generalized anxiety disorder: Secondary | ICD-10-CM

## 2013-10-31 NOTE — Telephone Encounter (Signed)
Refill- alprazolam 1mg  tablet. Take one tablet by mouth twice a day as needed for sleep or anxiety. Last fill 12.4.14

## 2013-11-01 NOTE — Telephone Encounter (Signed)
OK to give refill but not to be filled til 1/30. What she has should last til end of month. Disp #60 with 1 rf

## 2013-11-01 NOTE — Telephone Encounter (Signed)
Please advise refill?   Last RX was done on 07-27-13 quantity 60 with 2 refills. Pt should of just got the last refill on 10-27-13. Unless pt is taking 2 every day?  If ok fax to [phone]

## 2013-11-02 NOTE — Telephone Encounter (Signed)
Patient called wanting refill of alprazolam 1 mg

## 2013-11-02 NOTE — Telephone Encounter (Signed)
Left message on voicemail to return my call.  

## 2013-11-03 NOTE — Telephone Encounter (Signed)
Patient was told to call you this morning

## 2013-11-03 NOTE — Telephone Encounter (Signed)
PATIENT IS RETURNING YOUR CALL  SHE IS AT WORK  PLEASE LEAVE HER A MESSAGE

## 2013-11-04 MED ORDER — ALPRAZOLAM 1 MG PO TABS: 1.0000 mg | ORAL_TABLET | Freq: Two times a day (BID) | ORAL | Status: DC | PRN

## 2013-11-04 NOTE — Telephone Encounter (Signed)
OK to refill since due, same strength same sig, with 2 rf

## 2013-11-04 NOTE — Telephone Encounter (Signed)
RX faxed

## 2013-11-04 NOTE — Telephone Encounter (Signed)
I'm sorry 09-26-13 would be the last refill not 10-27-13. Is this ok to fill? Pt was informed that if she doesn't hear back from me to check with the pharmacy this afternoon.

## 2013-11-28 ENCOUNTER — Telehealth: Payer: Self-pay | Admitting: Family Medicine

## 2013-11-28 ENCOUNTER — Other Ambulatory Visit: Payer: Self-pay | Admitting: Family Medicine

## 2013-11-28 DIAGNOSIS — G4700 Insomnia, unspecified: Secondary | ICD-10-CM

## 2013-11-28 MED ORDER — ZOLPIDEM TARTRATE 10 MG PO TABS
10.0000 mg | ORAL_TABLET | Freq: Every evening | ORAL | Status: DC | PRN
Start: 2013-11-28 — End: 2014-03-02

## 2013-11-28 NOTE — Telephone Encounter (Signed)
OK to refill Ambien same strength, same sig, #30 with 3 rf

## 2013-11-28 NOTE — Telephone Encounter (Signed)
Refill-zolpidem tartrate 10mg  tablet. Take one tablet by mouth at bedtime as needed.

## 2013-11-28 NOTE — Telephone Encounter (Signed)
RX faxed to pharmacy.

## 2013-11-28 NOTE — Telephone Encounter (Signed)
Please advise refill? Last RX was done on 07-27-13 quantity 30 with 3 refills.  If ok fax to [phone]

## 2013-12-08 ENCOUNTER — Ambulatory Visit (INDEPENDENT_AMBULATORY_CARE_PROVIDER_SITE_OTHER): Payer: BC Managed Care – PPO | Admitting: Family Medicine

## 2013-12-08 ENCOUNTER — Encounter: Payer: Self-pay | Admitting: Family Medicine

## 2013-12-08 VITALS — BP 124/82 | HR 78 | Temp 98.0°F | Ht 61.5 in | Wt 153.0 lb

## 2013-12-08 DIAGNOSIS — F418 Other specified anxiety disorders: Secondary | ICD-10-CM

## 2013-12-08 DIAGNOSIS — K219 Gastro-esophageal reflux disease without esophagitis: Secondary | ICD-10-CM

## 2013-12-08 DIAGNOSIS — R739 Hyperglycemia, unspecified: Secondary | ICD-10-CM

## 2013-12-08 DIAGNOSIS — F341 Dysthymic disorder: Secondary | ICD-10-CM

## 2013-12-08 DIAGNOSIS — R7309 Other abnormal glucose: Secondary | ICD-10-CM

## 2013-12-08 DIAGNOSIS — E782 Mixed hyperlipidemia: Secondary | ICD-10-CM

## 2013-12-08 DIAGNOSIS — E785 Hyperlipidemia, unspecified: Secondary | ICD-10-CM

## 2013-12-08 DIAGNOSIS — R030 Elevated blood-pressure reading, without diagnosis of hypertension: Secondary | ICD-10-CM

## 2013-12-08 DIAGNOSIS — IMO0001 Reserved for inherently not codable concepts without codable children: Secondary | ICD-10-CM

## 2013-12-08 LAB — RENAL FUNCTION PANEL
Albumin: 4.7 g/dL (ref 3.5–5.2)
BUN: 14 mg/dL (ref 6–23)
CHLORIDE: 101 meq/L (ref 96–112)
CO2: 29 mEq/L (ref 19–32)
Calcium: 9.8 mg/dL (ref 8.4–10.5)
Creat: 0.83 mg/dL (ref 0.50–1.10)
GLUCOSE: 106 mg/dL — AB (ref 70–99)
Phosphorus: 3.5 mg/dL (ref 2.3–4.6)
Potassium: 4.8 mEq/L (ref 3.5–5.3)
Sodium: 142 mEq/L (ref 135–145)

## 2013-12-08 LAB — CBC
HCT: 40.8 % (ref 36.0–46.0)
HEMOGLOBIN: 14.1 g/dL (ref 12.0–15.0)
MCH: 28.7 pg (ref 26.0–34.0)
MCHC: 34.6 g/dL (ref 30.0–36.0)
MCV: 83.1 fL (ref 78.0–100.0)
PLATELETS: 209 10*3/uL (ref 150–400)
RBC: 4.91 MIL/uL (ref 3.87–5.11)
RDW: 13.8 % (ref 11.5–15.5)
WBC: 4.6 10*3/uL (ref 4.0–10.5)

## 2013-12-08 LAB — LIPID PANEL
CHOL/HDL RATIO: 3.7 ratio
Cholesterol: 187 mg/dL (ref 0–200)
HDL: 51 mg/dL (ref >39)
LDL Cholesterol: 102 mg/dL — ABNORMAL HIGH (ref 0–99)
Triglycerides: 168 mg/dL — ABNORMAL HIGH (ref <150)
VLDL: 34 mg/dL (ref 0–40)

## 2013-12-08 LAB — HEPATIC FUNCTION PANEL
ALK PHOS: 44 U/L (ref 39–117)
ALT: 24 U/L (ref 0–35)
AST: 22 U/L (ref 0–37)
Albumin: 4.7 g/dL (ref 3.5–5.2)
BILIRUBIN DIRECT: 0.1 mg/dL (ref 0.0–0.3)
BILIRUBIN TOTAL: 0.5 mg/dL (ref 0.2–1.2)
Indirect Bilirubin: 0.4 mg/dL (ref 0.2–1.2)
Total Protein: 6.9 g/dL (ref 6.0–8.3)

## 2013-12-08 LAB — HEMOGLOBIN A1C
Hgb A1c MFr Bld: 5.7 % — ABNORMAL HIGH (ref <5.7)
MEAN PLASMA GLUCOSE: 117 mg/dL — AB (ref <117)

## 2013-12-08 LAB — TSH: TSH: 1.202 u[IU]/mL (ref 0.350–4.500)

## 2013-12-08 NOTE — Progress Notes (Signed)
Pre visit review using our clinic review tool, if applicable. No additional management support is needed unless otherwise documented below in the visit note. 

## 2013-12-08 NOTE — Patient Instructions (Signed)

## 2013-12-08 NOTE — Progress Notes (Signed)
Subjective:     Patient ID: Tricia Fleming, female   DOB: [birth year], 63 y.o.   MRN: [id number]   CC: Pt is here for 4 mo f/u for depression/anxiety/insomnia and hyperlipidemia.   HPI  Insomnia- Was advised last visit to cut down on medications for sleep and supplement with melatonin as needed.  States she tries to go to sleep without medicine 2x per week but doesn't find these nights as restful and with more frequent awakenings. When she takes [place], only wakes up max 1 time per night. Denies taking melatonin.   Depression/anxiety - Doing okay on current medicine. Pt feels that "as soon as one thing goes away, another thing takes its' place." Takes xanax 1-2 times per day. Reports general lack of energy levels. Pt reports walking occasionally for exercise. Reports following a crescendo diet.   Hyperlipidemia- pt alternates krill and fish oil for dietary supplement daily.   Review of Systems  Constitutional: Positive for fatigue. Negative for fever, activity change, appetite change and unexpected weight change.  Gastrointestinal: Negative for abdominal pain.  Musculoskeletal: Positive for myalgias.       Upper shoulder and upper arm aches bilaterally x 2 episodes per week. This is not a new problem.   Neurological: Negative for headaches.    Current Outpatient Prescriptions on File Prior to Visit  Medication Sig Dispense Refill  . ALPRAZolam (XANAX) 1 MG tablet Take 1 tablet (1 mg total) by mouth 2 (two) times daily as needed for sleep or anxiety.  60 tablet  2  . escitalopram (LEXAPRO) 10 MG tablet TAKE 1 TABLET BY MOUTH EVERY DAY  30 tablet  3  . fish oil-omega-3 fatty acids 1000 MG capsule Take 1,000 mg by mouth daily. 2 caps       . lovastatin (MEVACOR) 40 MG tablet Take 1 tablet (40 mg total) by mouth at bedtime.  90 tablet  3  . zolpidem (AMBIEN) 10 MG tablet Take 1 tablet (10 mg total) by mouth at bedtime as needed for sleep.  30 tablet  3   No current facility-administered  medications on file prior to visit.   Past Medical History  Diagnosis Date  . Hyperlipidemia   . Anxiety   . Preventative health care 06/27/2011  . Depression with anxiety 06/27/2011  . Depression   . Breast lesion 06/18/2010    Qualifier: Diagnosis of  By: Charlett Blake MD, Erline Levine    . Shoulder pain, bilateral 02/13/2013  . Hyperglycemia 07/27/2013       Objective:   Physical Exam  Constitutional: She is oriented to person, place, and time. Vital signs are normal. She appears well-developed and well-nourished. No distress.  HENT:  Head: Normocephalic and atraumatic.  Right Ear: Hearing normal.  Left Ear: Hearing normal.  Nose: No rhinorrhea.  Eyes: Lids are normal. Right eye exhibits no discharge. Left eye exhibits no discharge.  Cardiovascular: Normal rate, regular rhythm and normal heart sounds.   Pulmonary/Chest: Effort normal and breath sounds normal. No respiratory distress.  Neurological: She is alert and oriented to person, place, and time.  Psychiatric: She has a normal mood and affect.   Filed Vitals:   12/08/13 0813  BP: 124/82  Pulse: 78  Temp: 98 F (36.7 C)   Lipid Panel     Component Value Date/Time   CHOL 216* 07/27/2013 0927   TRIG 134 07/27/2013 0927   HDL 49 07/27/2013 0927   CHOLHDL 4.4 07/27/2013 0927   VLDL 27 07/27/2013 0927  LDLCALC 140* 07/27/2013 9758        Assessment:       1. Depression with anxiety  2. Insomnia 3. Hyperlipidemia 4. Elevated glucose levels 07/2013      Plan:       1. Depression with anxiety- controlled with current medications. Pt was educated about the benefits of exercise in helping to manage mood. Pt was encouraged to taker her Lexapro daily and not skip doses.  2. Insomnia - controlled with current medication. Pt was advised to try melatonin as she attempts to decrease her use of ambien.  3. Hyperlipdemia-- pt was encouraged to keep taking fish/krill oil. Lipid panel to be drawn today.  4. H/o hyperglycemia. HgbA1c  today.  5. Labs today: HgbA1c, lipid panel, CBC, TSH, CMP.   6. F/u 3-4 months with 30 minutes slot for repeat pap smear.   02.12.15  [place] Keaundra Stehle, PA-S.   Patient seen, interviewed  And examined with student, agree with documentation  ELEVATED BP READING WITHOUT DX HYPERTENSION Well controlled, no changes.   Mixed hyperlipidemia Tolerating Lovasatatin. Avoid trans fats.   GERD Avoid offending foods, start a probiotic now.   Depression with anxiety Good response to Lexapro continue same use Alprazolam sparingly

## 2013-12-11 NOTE — Assessment & Plan Note (Signed)
Avoid offending foods, start a probiotic now.

## 2013-12-11 NOTE — Assessment & Plan Note (Signed)
Good response to Lexapro continue same use Alprazolam sparingly

## 2013-12-11 NOTE — Assessment & Plan Note (Signed)
Tolerating Lovasatatin. Avoid trans fats.

## 2013-12-11 NOTE — Assessment & Plan Note (Signed)
Well controlled, no changes 

## 2014-01-13 ENCOUNTER — Telehealth: Payer: Self-pay

## 2014-01-13 NOTE — Telephone Encounter (Signed)
Pt left a message stating she needs refills on Lexapro. I called patient and left a detailed vm stating to check with her pharmacy because we show that the pharmacy confirmed receipt of the rx on 11-28-13 quantity 30 with 3 refills

## 2014-01-30 ENCOUNTER — Telehealth: Payer: Self-pay | Admitting: Family Medicine

## 2014-01-30 NOTE — Telephone Encounter (Signed)
Called in refill to [facility] in [place].

## 2014-01-30 NOTE — Telephone Encounter (Signed)
Last refill on 11/01/2013. Last office visit is 12/08/2013

## 2014-01-30 NOTE — Telephone Encounter (Signed)
OK to refill Alprazolam same sig, same strength, disp #60 with 1 rf, already has appt next month

## 2014-01-30 NOTE — Telephone Encounter (Signed)
Refill alprazolam 

## 2014-03-02 ENCOUNTER — Other Ambulatory Visit (HOSPITAL_COMMUNITY)
Admission: RE | Admit: 2014-03-02 | Discharge: 2014-03-02 | Disposition: A | Discharge status: Home / Self Care | Payer: BC Managed Care – PPO | Source: Ambulatory Visit | Attending: Family Medicine | Admitting: Family Medicine

## 2014-03-02 ENCOUNTER — Ambulatory Visit (INDEPENDENT_AMBULATORY_CARE_PROVIDER_SITE_OTHER): Payer: BC Managed Care – PPO | Admitting: Family Medicine

## 2014-03-02 ENCOUNTER — Encounter: Payer: Self-pay | Admitting: Family Medicine

## 2014-03-02 VITALS — BP 130/90 | HR 75 | Temp 98.2°F | Ht 61.5 in | Wt 155.0 lb

## 2014-03-02 DIAGNOSIS — F341 Dysthymic disorder: Secondary | ICD-10-CM

## 2014-03-02 DIAGNOSIS — Z01419 Encounter for gynecological examination (general) (routine) without abnormal findings: Secondary | ICD-10-CM | POA: Insufficient documentation

## 2014-03-02 DIAGNOSIS — R030 Elevated blood-pressure reading, without diagnosis of hypertension: Secondary | ICD-10-CM

## 2014-03-02 DIAGNOSIS — G4700 Insomnia, unspecified: Secondary | ICD-10-CM

## 2014-03-02 DIAGNOSIS — F32A Depression, unspecified: Secondary | ICD-10-CM

## 2014-03-02 DIAGNOSIS — E782 Mixed hyperlipidemia: Secondary | ICD-10-CM

## 2014-03-02 DIAGNOSIS — K219 Gastro-esophageal reflux disease without esophagitis: Secondary | ICD-10-CM

## 2014-03-02 DIAGNOSIS — F329 Major depressive disorder, single episode, unspecified: Secondary | ICD-10-CM

## 2014-03-02 DIAGNOSIS — Z124 Encounter for screening for malignant neoplasm of cervix: Secondary | ICD-10-CM

## 2014-03-02 DIAGNOSIS — F3289 Other specified depressive episodes: Secondary | ICD-10-CM

## 2014-03-02 DIAGNOSIS — F411 Generalized anxiety disorder: Secondary | ICD-10-CM

## 2014-03-02 DIAGNOSIS — F418 Other specified anxiety disorders: Secondary | ICD-10-CM

## 2014-03-02 MED ORDER — ALPRAZOLAM 1 MG PO TABS: 1.0000 mg | ORAL_TABLET | Freq: Two times a day (BID) | ORAL | Status: DC | PRN

## 2014-03-02 MED ORDER — BUPROPION HCL ER (XL) 300 MG PO TB24: 300.0000 mg | ORAL_TABLET | Freq: Every day | ORAL | Status: DC

## 2014-03-02 MED ORDER — BUPROPION HCL ER (XL) 150 MG PO TB24: 150.0000 mg | ORAL_TABLET | Freq: Every day | ORAL | Status: DC

## 2014-03-02 MED ORDER — ZOLPIDEM TARTRATE 10 MG PO TABS: 10.0000 mg | ORAL_TABLET | Freq: Every evening | ORAL | Status: DC | PRN

## 2014-03-02 NOTE — Progress Notes (Signed)
Pre visit review using our clinic review tool, if applicable. No additional management support is needed unless otherwise documented below in the visit note. 

## 2014-03-02 NOTE — Patient Instructions (Signed)

## 2014-03-05 ENCOUNTER — Encounter: Payer: Self-pay | Admitting: Family Medicine

## 2014-03-05 DIAGNOSIS — F411 Generalized anxiety disorder: Secondary | ICD-10-CM | POA: Insufficient documentation

## 2014-03-05 NOTE — Assessment & Plan Note (Signed)
Last pap was inadequate, will repeat today.

## 2014-03-05 NOTE — Assessment & Plan Note (Signed)
Tolerating statin, encouraged heart healthy diet, avoid trans fats, minimize simple carbs and saturated fats. Increase exercise as tolerated 

## 2014-03-05 NOTE — Progress Notes (Signed)
Patient ID: Tricia Fleming, female   DOB: 07/09/1951, 63 y.o.   MRN: [id number] Tricia Fleming [id number] [birth year] 03/05/2014      Progress Note-Follow Up  Subjective  Chief Complaint  No chief complaint on file.   HPI  Patient is a 63 year old female in today for routine medical care. She's generally doing well. Is in need of repeat Pap to inadequate cells last time. She denies any GYN complaints. No recent illness. He is to struggle with shoulder pain but it is not worse. Lexapro was not helpful so she stopped it. Denies CP/palp/SOB/HA/congestion/fevers/GI or GU c/o. Taking meds as prescribed  Past Medical History  Diagnosis Date  . Hyperlipidemia   . Anxiety   . Preventative health care 06/27/2011  . Depression with anxiety 06/27/2011  . Depression   . Breast lesion 06/18/2010    Qualifier: Diagnosis of  By: Charlett Blake MD, Erline Levine    . Shoulder pain, bilateral 02/13/2013  . Hyperglycemia 07/27/2013    Past Surgical History  Procedure Laterality Date  . Tubal ligation      Family History  Problem Relation Age of Onset  . Cancer Father     metastic prostate  . Diabetes Mother   . Hypertension Mother   . Coronary artery disease Mother   . Transient ischemic attack Sister   . Heart disease Brother   . Benign prostatic hyperplasia Brother   . Lung disease Brother     in a smoker  . Insulin resistance Brother   . Hypertension Sister   . Benign prostatic hyperplasia Brother   . Alcohol abuse Brother   . Arthritis Sister   . Esophageal varices Brother   . Cirrhosis Brother   . Diabetes Maternal Grandmother   . Coronary artery disease Maternal Grandmother   . Diabetes Maternal Grandfather   . Cancer Maternal Aunt   . Birth defects Paternal Aunt     breast    History   Social History  . Marital Status: Married    Spouse Name: N/A    Number of Children: N/A  . Years of Education: N/A   Occupational History  . Not on file.   Social History Main Topics  . Smoking  status: Never Smoker   . Smokeless tobacco: Never Used  . Alcohol Use: 0.0 oz/week    0 drink(s) per week     Comment: a couple beers a week  . Drug Use: No  . Sexual Activity: Yes    Partners: Male   Other Topics Concern  . Not on file   Social History Narrative  . No narrative on file    Current Outpatient Prescriptions on File Prior to Visit  Medication Sig Dispense Refill  . fish oil-omega-3 fatty acids 1000 MG capsule Take 1,000 mg by mouth daily. 2 caps       . lovastatin (MEVACOR) 40 MG tablet Take 1 tablet (40 mg total) by mouth at bedtime.  90 tablet  3   No current facility-administered medications on file prior to visit.    No Known Allergies  Review of Systems  Review of Systems  Constitutional: Negative for fever and malaise/fatigue.  HENT: Negative for congestion.   Eyes: Negative for discharge.  Respiratory: Negative for shortness of breath.   Cardiovascular: Negative for chest pain, palpitations and leg swelling.  Gastrointestinal: Negative for nausea, abdominal pain and diarrhea.  Genitourinary: Negative for dysuria.  Musculoskeletal: Negative for falls.  Skin: Negative for rash.  Neurological: Negative  for loss of consciousness and headaches.  Endo/Heme/Allergies: Negative for polydipsia.  Psychiatric/Behavioral: Negative for depression and suicidal ideas. The patient is nervous/anxious. The patient does not have insomnia.     Objective  BP 130/90  Pulse 75  Temp(Src) 98.2 F (36.8 C) (Oral)  Ht 5' 1.5" (1.562 m)  Wt 155 lb 0.6 oz (70.326 kg)  BMI 28.82 kg/m2  SpO2 96%  Physical Exam  Physical Exam  Constitutional: She is oriented to person, place, and time and well-developed, well-nourished, and in no distress. No distress.  HENT:  Head: Normocephalic and atraumatic.  Eyes: Conjunctivae are normal.  Neck: Neck supple. No thyromegaly present.  Cardiovascular: Normal rate, regular rhythm and normal heart sounds.   No murmur  heard. Pulmonary/Chest: Effort normal and breath sounds normal. She has no wheezes.  Abdominal: She exhibits no distension and no mass.  Genitourinary: Vagina normal, uterus normal, cervix normal, right adnexa normal and left adnexa normal.  Musculoskeletal: She exhibits no edema.  Lymphadenopathy:    She has no cervical adenopathy.  Neurological: She is alert and oriented to person, place, and time.  Skin: Skin is warm and dry. No rash noted. She is not diaphoretic.  Psychiatric: Memory, affect and judgment normal.    Lab Results  Component Value Date   TSH 1.202 12/08/2013   Lab Results  Component Value Date   WBC 4.6 12/08/2013   HGB 14.1 12/08/2013   HCT 40.8 12/08/2013   MCV 83.1 12/08/2013   PLT 209 12/08/2013   Lab Results  Component Value Date   CREATININE 0.83 12/08/2013   BUN 14 12/08/2013   NA 142 12/08/2013   K 4.8 12/08/2013   CL 101 12/08/2013   CO2 29 12/08/2013   Lab Results  Component Value Date   ALT 24 12/08/2013   AST 22 12/08/2013   ALKPHOS 44 12/08/2013   BILITOT 0.5 12/08/2013   Lab Results  Component Value Date   CHOL 187 12/08/2013   Lab Results  Component Value Date   HDL 51 12/08/2013   Lab Results  Component Value Date   LDLCALC 102* 12/08/2013   Lab Results  Component Value Date   TRIG 168* 12/08/2013   Lab Results  Component Value Date   CHOLHDL 3.7 12/08/2013     Assessment & Plan  Cervical cancer screening Last pap was inadequate, will repeat today.  Depression with anxiety Continue Wellbutrin.   ELEVATED BP READING WITHOUT DX HYPERTENSION Well controlled, no changes to meds. Encouraged heart healthy diet such as the DASH diet and exercise as tolerated.   GERD Avoid offending foods, start probiotics. Do not eat large meals in late evening and consider raising head of bed.   Mixed hyperlipidemia Tolerating statin, encouraged heart healthy diet, avoid trans fats, minimize simple carbs and saturated fats. Increase exercise as  tolerated

## 2014-03-05 NOTE — Assessment & Plan Note (Signed)
Well controlled, no changes to meds. Encouraged heart healthy diet such as the DASH diet and exercise as tolerated.  °

## 2014-03-05 NOTE — Assessment & Plan Note (Signed)
-   Continue Wellbutrin 

## 2014-03-05 NOTE — Assessment & Plan Note (Signed)
Avoid offending foods, start probiotics. Do not eat large meals in late evening and consider raising head of bed.  

## 2014-04-17 ENCOUNTER — Telehealth: Payer: Self-pay | Admitting: Family Medicine

## 2014-04-17 DIAGNOSIS — E785 Hyperlipidemia, unspecified: Secondary | ICD-10-CM

## 2014-04-17 MED ORDER — LOVASTATIN 40 MG PO TABS: 40.0000 mg | ORAL_TABLET | Freq: Every day | ORAL | Status: DC

## 2014-04-17 NOTE — Telephone Encounter (Signed)
Refill- lovastatin  [facility] [address] in [facility]

## 2014-04-17 NOTE — Telephone Encounter (Signed)
Rx request to pharmacy/[facility]  

## 2014-05-04 ENCOUNTER — Encounter: Payer: Self-pay | Admitting: Family Medicine

## 2014-05-04 ENCOUNTER — Ambulatory Visit (INDEPENDENT_AMBULATORY_CARE_PROVIDER_SITE_OTHER): Payer: BC Managed Care – PPO | Admitting: Family Medicine

## 2014-05-04 VITALS — HR 90 | Temp 98.2°F | Ht 61.5 in | Wt 152.0 lb

## 2014-05-04 DIAGNOSIS — F418 Other specified anxiety disorders: Secondary | ICD-10-CM

## 2014-05-04 DIAGNOSIS — F32A Depression, unspecified: Secondary | ICD-10-CM

## 2014-05-04 DIAGNOSIS — K219 Gastro-esophageal reflux disease without esophagitis: Secondary | ICD-10-CM

## 2014-05-04 DIAGNOSIS — F329 Major depressive disorder, single episode, unspecified: Secondary | ICD-10-CM

## 2014-05-04 DIAGNOSIS — R030 Elevated blood-pressure reading, without diagnosis of hypertension: Secondary | ICD-10-CM

## 2014-05-04 DIAGNOSIS — L578 Other skin changes due to chronic exposure to nonionizing radiation: Secondary | ICD-10-CM

## 2014-05-04 DIAGNOSIS — E782 Mixed hyperlipidemia: Secondary | ICD-10-CM

## 2014-05-04 DIAGNOSIS — G4700 Insomnia, unspecified: Secondary | ICD-10-CM

## 2014-05-04 DIAGNOSIS — E785 Hyperlipidemia, unspecified: Secondary | ICD-10-CM

## 2014-05-04 DIAGNOSIS — C44721 Squamous cell carcinoma of skin of unspecified lower limb, including hip: Secondary | ICD-10-CM | POA: Insufficient documentation

## 2014-05-04 DIAGNOSIS — R739 Hyperglycemia, unspecified: Secondary | ICD-10-CM

## 2014-05-04 DIAGNOSIS — R7309 Other abnormal glucose: Secondary | ICD-10-CM

## 2014-05-04 DIAGNOSIS — F341 Dysthymic disorder: Secondary | ICD-10-CM

## 2014-05-04 DIAGNOSIS — F411 Generalized anxiety disorder: Secondary | ICD-10-CM

## 2014-05-04 DIAGNOSIS — F3289 Other specified depressive episodes: Secondary | ICD-10-CM

## 2014-05-04 HISTORY — DX: Squamous cell carcinoma of skin of unspecified lower limb, including hip: C44.721

## 2014-05-04 LAB — RENAL FUNCTION PANEL
Albumin: 4.6 g/dL (ref 3.5–5.2)
BUN: 15 mg/dL (ref 6–23)
CHLORIDE: 104 meq/L (ref 96–112)
CO2: 28 mEq/L (ref 19–32)
Calcium: 9.4 mg/dL (ref 8.4–10.5)
Creat: 0.81 mg/dL (ref 0.50–1.10)
Glucose, Bld: 98 mg/dL (ref 70–99)
PHOSPHORUS: 3.1 mg/dL (ref 2.3–4.6)
POTASSIUM: 4.7 meq/L (ref 3.5–5.3)
Sodium: 141 mEq/L (ref 135–145)

## 2014-05-04 LAB — LIPID PANEL
CHOLESTEROL: 183 mg/dL (ref 0–200)
HDL: 43 mg/dL (ref >39)
LDL Cholesterol: 101 mg/dL — ABNORMAL HIGH (ref 0–99)
Total CHOL/HDL Ratio: 4.3 Ratio
Triglycerides: 194 mg/dL — ABNORMAL HIGH (ref <150)
VLDL: 39 mg/dL (ref 0–40)

## 2014-05-04 LAB — HEPATIC FUNCTION PANEL
ALK PHOS: 44 U/L (ref 39–117)
ALT: 26 U/L (ref 0–35)
AST: 22 U/L (ref 0–37)
Albumin: 4.6 g/dL (ref 3.5–5.2)
BILIRUBIN INDIRECT: 0.4 mg/dL (ref 0.2–1.2)
BILIRUBIN TOTAL: 0.5 mg/dL (ref 0.2–1.2)
Bilirubin, Direct: 0.1 mg/dL (ref 0.0–0.3)
TOTAL PROTEIN: 6.7 g/dL (ref 6.0–8.3)

## 2014-05-04 LAB — CBC
HCT: 38.8 % (ref 36.0–46.0)
HEMOGLOBIN: 13.6 g/dL (ref 12.0–15.0)
MCH: 28.8 pg (ref 26.0–34.0)
MCHC: 35.1 g/dL (ref 30.0–36.0)
MCV: 82.2 fL (ref 78.0–100.0)
Platelets: 197 10*3/uL (ref 150–400)
RBC: 4.72 MIL/uL (ref 3.87–5.11)
RDW: 13.8 % (ref 11.5–15.5)
WBC: 4.2 10*3/uL (ref 4.0–10.5)

## 2014-05-04 LAB — HEMOGLOBIN A1C
Hgb A1c MFr Bld: 5.8 % — ABNORMAL HIGH (ref <5.7)
MEAN PLASMA GLUCOSE: 120 mg/dL — AB (ref <117)

## 2014-05-04 LAB — TSH: TSH: 1.335 u[IU]/mL (ref 0.350–4.500)

## 2014-05-04 MED ORDER — BUPROPION HCL ER (XL) 150 MG PO TB24: 150.0000 mg | ORAL_TABLET | Freq: Every day | ORAL | Status: DC

## 2014-05-04 NOTE — Assessment & Plan Note (Signed)
Encouraged good sleep hygiene such as dark, quiet room. No blue/green glowing lights such as computer screens in bedroom. No alcohol or stimulants in evening. Cut down on caffeine as able. Regular exercise is helpful but not just prior to bed time. Doing well with Ambien prn

## 2014-05-04 NOTE — Assessment & Plan Note (Signed)
Is doing well on buproprion is considering stopping may titrate off with lower dose x 7 days then stop

## 2014-05-04 NOTE — Progress Notes (Signed)
Patient ID: Tricia Fleming, female   DOB: 14-Oct-1951, 63 y.o.   MRN: [id number] HPI Pt is a pleasant 63yo Caucasian F who is here for 2 month f/u. Pt c/o "rough patches" on bilateral forearms x 2 months, sts area does not itch but concerned about texture. She also has a dry patch to right upper calf that has been present x 1 month. Pt sts her mood is unchanged, sad occasionally but feels better when with people. She is not sure Wellbutrin is working for her and is considering stopping medication.Denies cp, shob, fever/chills, n/v, and changes in bowel habits.   Past Medical History  Diagnosis Date  . Hyperlipidemia   . Anxiety   . Preventative health care 06/27/2011  . Depression with anxiety 06/27/2011  . Depression   . Breast lesion 06/18/2010    Qualifier: Diagnosis of  By: Charlett Blake MD, Erline Levine    . Shoulder pain, bilateral 02/13/2013  . Hyperglycemia 07/27/2013    History   Social History  . Marital Status: Married    Spouse Name: N/A    Number of Children: N/A  . Years of Education: N/A   Social History Main Topics  . Smoking status: Never Smoker   . Smokeless tobacco: Never Used  . Alcohol Use: 0.0 oz/week    0 drink(s) per week     Comment: a couple beers a week  . Drug Use: No  . Sexual Activity: Yes    Partners: Male   Other Topics Concern  . None   Social History Narrative  . None    No Known Allergies Outpatient Prescriptions Prior to Visit  Medication Sig Dispense Refill  . ALPRAZolam (XANAX) 1 MG tablet Take 1 tablet (1 mg total) by mouth 2 (two) times daily as needed for sleep or anxiety.  60 tablet  3  . buPROPion (WELLBUTRIN XL) 300 MG 24 hr tablet Take 1 tablet (300 mg total) by mouth daily.  30 tablet  2  . fish oil-omega-3 fatty acids 1000 MG capsule Take 1,000 mg by mouth daily. 2 caps       . lovastatin (MEVACOR) 40 MG tablet Take 1 tablet (40 mg total) by mouth at bedtime.  90 tablet  0  . zolpidem (AMBIEN) 10 MG tablet Take 1 tablet (10 mg total) by mouth  at bedtime as needed for sleep.  30 tablet  3  . buPROPion (WELLBUTRIN XL) 150 MG 24 hr tablet Take 1 tablet (150 mg total) by mouth daily.  7 tablet  0   No facility-administered medications prior to visit.   Review of Systems  Constitutional: Negative.   HENT: Negative.   Respiratory: Negative.   Cardiovascular: Negative.   Gastrointestinal: Negative.   Genitourinary: Negative.   Musculoskeletal: Negative.   Skin: Positive for rash. Negative for itching.       Dry, rough areas to posterior forearms   Neurological: Negative.   Endo/Heme/Allergies: Negative.   Psychiatric/Behavioral: Positive for depression. Negative for suicidal ideas. The patient has insomnia.        Mood stable. See HPI. Sleeping has improved with ambien.    Physical Exam  Vitals reviewed. Constitutional: She is oriented to person, place, and time. She appears well-developed and well-nourished.  Eyes: Conjunctivae are normal.  Neck: Normal range of motion. Neck supple.  Cardiovascular: Normal rate, regular rhythm, normal heart sounds and intact distal pulses.  Exam reveals no gallop and no friction rub.   No murmur heard. Pulmonary/Chest: Effort normal and breath  sounds normal. She has no wheezes. She has no rales.  Neurological: She is alert and oriented to person, place, and time.  Skin: Skin is warm and dry. No erythema. No pallor.  Dry, rough texture to bilateral posterior forearms, no erythema  Scaly papule to posterior R proximal calf, mild erythema surrounding  Psychiatric: She has a normal mood and affect. Her behavior is normal. Judgment and thought content normal.   Patient seen and examined with student, agree with above documentation  A/P   GERD Avoid offending foods, start probiotics. Do not eat large meals in late evening and consider raising head of bed.   Hyperglycemia hgba1c acceptable, minimize simple carbs. Increase exercise as tolerated.  Mixed hyperlipidemia Encouraged heart  healthy diet, increase exercise, avoid trans fats, consider a krill oil cap daily  ELEVATED BP READING WITHOUT DX HYPERTENSION Well controlled, no changes to meds. Encouraged heart healthy diet such as the DASH diet and exercise as tolerated.   Depression with anxiety Is doing well on buproprion is considering stopping may titrate off with lower dose x 7 days then stop  INSOMNIA UNSPECIFIED Encouraged good sleep hygiene such as dark, quiet room. No blue/green glowing lights such as computer screens in bedroom. No alcohol or stimulants in evening. Cut down on caffeine as able. Regular exercise is helpful but not just prior to bed time. Doing well with Ambien prn  Sun-damaged skin Referred to dermatology for lesion on leg and sun damag

## 2014-05-04 NOTE — Assessment & Plan Note (Signed)
Avoid offending foods, start probiotics. Do not eat large meals in late evening and consider raising head of bed.  

## 2014-05-04 NOTE — Progress Notes (Signed)
Pre visit review using our clinic review tool, if applicable. No additional management support is needed unless otherwise documented below in the visit note. 

## 2014-05-04 NOTE — Assessment & Plan Note (Signed)
Encouraged heart healthy diet, increase exercise, avoid trans fats, consider a krill oil cap daily 

## 2014-05-04 NOTE — Assessment & Plan Note (Signed)
hgba1c acceptable, minimize simple carbs. Increase exercise as tolerated.  

## 2014-05-04 NOTE — Assessment & Plan Note (Signed)
Well controlled, no changes to meds. Encouraged heart healthy diet such as the DASH diet and exercise as tolerated.  °

## 2014-05-04 NOTE — Assessment & Plan Note (Signed)
>>  ASSESSMENT AND PLAN FOR MIXED DIABETIC HYPERLIPIDEMIA ASSOCIATED WITH TYPE 2 DIABETES MELLITUS ([facility]) WRITTEN ON 05/04/2014  9:57 PM BY BLYTH, STACEY A, MD  hgba1c acceptable, minimize simple carbs. Increase exercise as tolerated.

## 2014-05-04 NOTE — Assessment & Plan Note (Signed)
Referred to dermatology for lesion on leg and sun damag

## 2014-07-18 ENCOUNTER — Other Ambulatory Visit: Payer: Self-pay

## 2014-07-18 DIAGNOSIS — E785 Hyperlipidemia, unspecified: Secondary | ICD-10-CM

## 2014-07-18 MED ORDER — LOVASTATIN 40 MG PO TABS: 40.0000 mg | ORAL_TABLET | Freq: Every day | ORAL | Status: DC

## 2014-07-21 ENCOUNTER — Other Ambulatory Visit: Payer: Self-pay

## 2014-07-21 DIAGNOSIS — E785 Hyperlipidemia, unspecified: Secondary | ICD-10-CM

## 2014-07-21 MED ORDER — LOVASTATIN 40 MG PO TABS: 40.0000 mg | ORAL_TABLET | Freq: Every day | ORAL | Status: DC

## 2014-07-24 ENCOUNTER — Ambulatory Visit (INDEPENDENT_AMBULATORY_CARE_PROVIDER_SITE_OTHER): Payer: BC Managed Care – PPO | Admitting: Family Medicine

## 2014-07-24 ENCOUNTER — Encounter: Payer: Self-pay | Admitting: Family Medicine

## 2014-07-24 VITALS — BP 127/60 | HR 85 | Temp 99.0°F | Ht 61.5 in | Wt 155.6 lb

## 2014-07-24 DIAGNOSIS — R739 Hyperglycemia, unspecified: Secondary | ICD-10-CM

## 2014-07-24 DIAGNOSIS — IMO0001 Reserved for inherently not codable concepts without codable children: Secondary | ICD-10-CM

## 2014-07-24 DIAGNOSIS — K219 Gastro-esophageal reflux disease without esophagitis: Secondary | ICD-10-CM

## 2014-07-24 DIAGNOSIS — F418 Other specified anxiety disorders: Secondary | ICD-10-CM

## 2014-07-24 DIAGNOSIS — F411 Generalized anxiety disorder: Secondary | ICD-10-CM

## 2014-07-24 DIAGNOSIS — L578 Other skin changes due to chronic exposure to nonionizing radiation: Secondary | ICD-10-CM

## 2014-07-24 DIAGNOSIS — Z23 Encounter for immunization: Secondary | ICD-10-CM

## 2014-07-24 DIAGNOSIS — R030 Elevated blood-pressure reading, without diagnosis of hypertension: Secondary | ICD-10-CM

## 2014-07-24 DIAGNOSIS — G4700 Insomnia, unspecified: Secondary | ICD-10-CM | POA: Diagnosis not present

## 2014-07-24 DIAGNOSIS — E782 Mixed hyperlipidemia: Secondary | ICD-10-CM

## 2014-07-24 DIAGNOSIS — F341 Dysthymic disorder: Secondary | ICD-10-CM

## 2014-07-24 DIAGNOSIS — E785 Hyperlipidemia, unspecified: Secondary | ICD-10-CM

## 2014-07-24 DIAGNOSIS — R7309 Other abnormal glucose: Secondary | ICD-10-CM

## 2014-07-24 LAB — HEPATIC FUNCTION PANEL
ALT: 35 U/L (ref 0–35)
AST: 29 U/L (ref 0–37)
Albumin: 4.9 g/dL (ref 3.5–5.2)
Alkaline Phosphatase: 49 U/L (ref 39–117)
BILIRUBIN DIRECT: 0 mg/dL (ref 0.0–0.3)
Total Bilirubin: 0.5 mg/dL (ref 0.2–1.2)
Total Protein: 8 g/dL (ref 6.0–8.3)

## 2014-07-24 LAB — CBC
HCT: 43.8 % (ref 36.0–46.0)
HEMOGLOBIN: 14.8 g/dL (ref 12.0–15.0)
MCHC: 33.8 g/dL (ref 30.0–36.0)
MCV: 86.3 fl (ref 78.0–100.0)
PLATELETS: 257 10*3/uL (ref 150.0–400.0)
RBC: 5.07 Mil/uL (ref 3.87–5.11)
RDW: 13.2 % (ref 11.5–15.5)
WBC: 5.4 10*3/uL (ref 4.0–10.5)

## 2014-07-24 LAB — RENAL FUNCTION PANEL
ALBUMIN: 4.9 g/dL (ref 3.5–5.2)
BUN: 17 mg/dL (ref 6–23)
CALCIUM: 9.7 mg/dL (ref 8.4–10.5)
CHLORIDE: 99 meq/L (ref 96–112)
CO2: 29 mEq/L (ref 19–32)
Creatinine, Ser: 0.9 mg/dL (ref 0.4–1.2)
GFR: 70.57 mL/min (ref >60.00)
GLUCOSE: 89 mg/dL (ref 70–99)
PHOSPHORUS: 3.8 mg/dL (ref 2.3–4.6)
POTASSIUM: 4.4 meq/L (ref 3.5–5.1)
Sodium: 136 mEq/L (ref 135–145)

## 2014-07-24 LAB — HEMOGLOBIN A1C: Hgb A1c MFr Bld: 5.9 % (ref 4.6–6.5)

## 2014-07-24 LAB — LIPID PANEL
CHOL/HDL RATIO: 4
CHOLESTEROL: 202 mg/dL — AB (ref 0–200)
HDL: 48.2 mg/dL (ref >39.00)
LDL CALC: 120 mg/dL — AB (ref 0–99)
NonHDL: 153.8
TRIGLYCERIDES: 169 mg/dL — AB (ref 0.0–149.0)
VLDL: 33.8 mg/dL (ref 0.0–40.0)

## 2014-07-24 LAB — TSH: TSH: 0.46 u[IU]/mL (ref 0.35–4.50)

## 2014-07-24 MED ORDER — ALPRAZOLAM 1 MG PO TABS: 1.0000 mg | ORAL_TABLET | Freq: Two times a day (BID) | ORAL | Status: DC | PRN

## 2014-07-24 MED ORDER — ZOLPIDEM TARTRATE 10 MG PO TABS: 10.0000 mg | ORAL_TABLET | Freq: Every evening | ORAL | Status: DC | PRN

## 2014-07-24 NOTE — Progress Notes (Signed)
Pre visit review using our clinic review tool, if applicable. No additional management support is needed unless otherwise documented below in the visit note. 

## 2014-07-24 NOTE — Patient Instructions (Addendum)
L tryptophan for sleep   Insomnia Insomnia is frequent trouble falling and/or staying asleep. Insomnia can be a long term problem or a short term problem. Both are common. Insomnia can be a short term problem when the wakefulness is related to a certain stress or worry. Long term insomnia is often related to ongoing stress during waking hours and/or poor sleeping habits. Overtime, sleep deprivation itself can make the problem worse. Every little thing feels more severe because you are overtired and your ability to cope is decreased. CAUSES   Stress, anxiety, and depression.  Poor sleeping habits.  Distractions such as TV in the bedroom.  Naps close to bedtime.  Engaging in emotionally charged conversations before bed.  Technical reading before sleep.  Alcohol and other sedatives. They may make the problem worse. They can hurt normal sleep patterns and normal dream activity.  Stimulants such as caffeine for several hours prior to bedtime.  Pain syndromes and shortness of breath can cause insomnia.  Exercise late at night.  Changing time zones may cause sleeping problems (jet lag). It is sometimes helpful to have someone observe your sleeping patterns. They should look for periods of not breathing during the night (sleep apnea). They should also look to see how long those periods last. If you live alone or observers are uncertain, you can also be observed at a sleep clinic where your sleep patterns will be professionally monitored. Sleep apnea requires a checkup and treatment. Give your caregivers your medical history. Give your caregivers observations your family has made about your sleep.  SYMPTOMS   Not feeling rested in the morning.  Anxiety and restlessness at bedtime.  Difficulty falling and staying asleep. TREATMENT   Your caregiver may prescribe treatment for an underlying medical disorders. Your caregiver can give advice or help if you are using alcohol or other drugs for  self-medication. Treatment of underlying problems will usually eliminate insomnia problems.  Medications can be prescribed for short time use. They are generally not recommended for lengthy use.  Over-the-counter sleep medicines are not recommended for lengthy use. They can be habit forming.  You can promote easier sleeping by making lifestyle changes such as:  Using relaxation techniques that help with breathing and reduce muscle tension.  Exercising earlier in the day.  Changing your diet and the time of your last meal. No night time snacks.  Establish a regular time to go to bed.  Counseling can help with stressful problems and worry.  Soothing music and white noise may be helpful if there are background noises you cannot remove.  Stop tedious detailed work at least one hour before bedtime. HOME CARE INSTRUCTIONS   Keep a diary. Inform your caregiver about your progress. This includes any medication side effects. See your caregiver regularly. Take note of:  Times when you are asleep.  Times when you are awake during the night.  The quality of your sleep.  How you feel the next day. This information will help your caregiver care for you.  Get out of bed if you are still awake after 15 minutes. Read or do some quiet activity. Keep the lights down. Wait until you feel sleepy and go back to bed.  Keep regular sleeping and waking hours. Avoid naps.  Exercise regularly.  Avoid distractions at bedtime. Distractions include watching television or engaging in any intense or detailed activity like attempting to balance the household checkbook.  Develop a bedtime ritual. Keep a familiar routine of bathing, brushing your teeth,  climbing into bed at the same time each night, listening to soothing music. Routines increase the success of falling to sleep faster.  Use relaxation techniques. This can be using breathing and muscle tension release routines. It can also include visualizing  peaceful scenes. You can also help control troubling or intruding thoughts by keeping your mind occupied with boring or repetitive thoughts like the old concept of counting sheep. You can make it more creative like imagining planting one beautiful flower after another in your backyard garden.  During your day, work to eliminate stress. When this is not possible use some of the previous suggestions to help reduce the anxiety that accompanies stressful situations. MAKE SURE YOU:   Understand these instructions.  Will watch your condition.  Will get help right away if you are not doing well or get worse. Document Released: 10/10/2000 Document Revised: 01/05/2012 Document Reviewed: 11/10/2007 [facility] Patient Information 2015 [place], [place]. This information is not intended to replace advice given to you by your health care provider. Make sure you discuss any questions you have with your health care provider.

## 2014-07-24 NOTE — Progress Notes (Signed)
Patient ID: Tricia Fleming, female   DOB: 26-Jun-1951, 63 y.o.   MRN: [id number] Tricia Fleming [id number] 1951/07/10 07/24/2014      Progress Note-Follow Up  Subjective  Chief Complaint  Chief Complaint  Patient presents with  . Follow-up  . Injections    flu    HPI  Patient is a 63 year old female in today for routine medical care. In today for followup. Doing fairly well. May be moving soon. She is handling the stress fairly well. No recent illness. Denies CP/palp/SOB/HA/congestion/fevers/GI or GU c/o. Taking meds as prescribed  Past Medical History  Diagnosis Date  . Hyperlipidemia   . Anxiety   . Preventative health care 06/27/2011  . Depression with anxiety 06/27/2011  . Depression   . Breast lesion 06/18/2010    Qualifier: Diagnosis of  By: Charlett Blake MD, Erline Levine    . Shoulder pain, bilateral 02/13/2013  . Hyperglycemia 07/27/2013    Past Surgical History  Procedure Laterality Date  . Tubal ligation      Family History  Problem Relation Age of Onset  . Cancer Father     metastic prostate  . Diabetes Mother   . Hypertension Mother   . Coronary artery disease Mother   . Transient ischemic attack Sister   . Heart disease Brother   . Benign prostatic hyperplasia Brother   . Lung disease Brother     in a smoker  . Insulin resistance Brother   . Hypertension Sister   . Benign prostatic hyperplasia Brother   . Alcohol abuse Brother   . Arthritis Sister   . Esophageal varices Brother   . Cirrhosis Brother   . Diabetes Maternal Grandmother   . Coronary artery disease Maternal Grandmother   . Diabetes Maternal Grandfather   . Cancer Maternal Aunt   . Birth defects Paternal Aunt     breast    History   Social History  . Marital Status: Married    Spouse Name: N/A    Number of Children: N/A  . Years of Education: N/A   Occupational History  . Not on file.   Social History Main Topics  . Smoking status: Never Smoker   . Smokeless tobacco: Never Used  .  Alcohol Use: 0.0 oz/week    0 drink(s) per week     Comment: a couple beers a week  . Drug Use: No  . Sexual Activity: Yes    Partners: Male   Other Topics Concern  . Not on file   Social History Narrative  . No narrative on file    Current Outpatient Prescriptions on File Prior to Visit  Medication Sig Dispense Refill  . ALPRAZolam (XANAX) 1 MG tablet Take 1 tablet (1 mg total) by mouth 2 (two) times daily as needed for sleep or anxiety.  60 tablet  3  . fish oil-omega-3 fatty acids 1000 MG capsule Take 1,000 mg by mouth daily. 2 caps       . lovastatin (MEVACOR) 40 MG tablet Take 1 tablet (40 mg total) by mouth at bedtime.  90 tablet  0  . zolpidem (AMBIEN) 10 MG tablet Take 1 tablet (10 mg total) by mouth at bedtime as needed for sleep.  30 tablet  3   No current facility-administered medications on file prior to visit.    No Known Allergies  Review of Systems  Review of Systems  Constitutional: Negative for fever and malaise/fatigue.  HENT: Negative for congestion.   Eyes: Negative for  discharge.  Respiratory: Negative for shortness of breath.   Cardiovascular: Negative for chest pain, palpitations and leg swelling.  Gastrointestinal: Negative for nausea, abdominal pain and diarrhea.  Genitourinary: Negative for dysuria.  Musculoskeletal: Negative for falls.  Skin: Negative for rash.  Neurological: Negative for loss of consciousness and headaches.  Endo/Heme/Allergies: Negative for polydipsia.  Psychiatric/Behavioral: Negative for depression and suicidal ideas. The patient is not nervous/anxious and does not have insomnia.     Objective  BP 127/60  Pulse 85  Temp(Src) 99 F (37.2 C) (Oral)  Ht 5' 1.5" (1.562 m)  Wt 155 lb 9.6 oz (70.58 kg)  BMI 28.93 kg/m2  SpO2 97%  Physical Exam  Physical Exam  Constitutional: She is oriented to person, place, and time and well-developed, well-nourished, and in no distress. No distress.  HENT:  Head: Normocephalic and  atraumatic.  Eyes: Conjunctivae are normal.  Neck: Neck supple. No thyromegaly present.  Cardiovascular: Normal rate, regular rhythm and normal heart sounds.   No murmur heard. Pulmonary/Chest: Effort normal and breath sounds normal. She has no wheezes.  Abdominal: She exhibits no distension and no mass.  Musculoskeletal: She exhibits no edema.  Lymphadenopathy:    She has no cervical adenopathy.  Neurological: She is alert and oriented to person, place, and time.  Skin: Skin is warm and dry. No rash noted. She is not diaphoretic.  Psychiatric: Memory, affect and judgment normal.    Lab Results  Component Value Date   TSH 1.335 05/04/2014   Lab Results  Component Value Date   WBC 4.2 05/04/2014   HGB 13.6 05/04/2014   HCT 38.8 05/04/2014   MCV 82.2 05/04/2014   PLT 197 05/04/2014   Lab Results  Component Value Date   CREATININE 0.81 05/04/2014   BUN 15 05/04/2014   NA 141 05/04/2014   K 4.7 05/04/2014   CL 104 05/04/2014   CO2 28 05/04/2014   Lab Results  Component Value Date   ALT 26 05/04/2014   AST 22 05/04/2014   ALKPHOS 44 05/04/2014   BILITOT 0.5 05/04/2014   Lab Results  Component Value Date   CHOL 183 05/04/2014   Lab Results  Component Value Date   HDL 43 05/04/2014   Lab Results  Component Value Date   LDLCALC 101* 05/04/2014   Lab Results  Component Value Date   TRIG 194* 05/04/2014   Lab Results  Component Value Date   CHOLHDL 4.3 05/04/2014     Assessment & Plan  ELEVATED BP READING WITHOUT DX HYPERTENSION Well controlled, no changes to meds. Encouraged heart healthy diet such as the DASH diet and exercise as tolerated.   Mixed hyperlipidemia Tolerating statin, encouraged heart healthy diet, avoid trans fats, minimize simple carbs and saturated fats. Increase exercise as tolerated  Depression with anxiety Is using less Alprazolam, continue prn use. Declines SSRI says she feels fairly well  GERD Avoid offending foods, start probiotics. Do not eat large meals in late  evening and consider raising head of bed.   Hyperglycemia hgba1c acceptable, minimize simple carbs. Increase exercise as tolerated.

## 2014-07-26 NOTE — Assessment & Plan Note (Signed)
Avoid offending foods, start probiotics. Do not eat large meals in late evening and consider raising head of bed.  

## 2014-07-26 NOTE — Assessment & Plan Note (Signed)
hgba1c acceptable, minimize simple carbs. Increase exercise as tolerated.  

## 2014-07-26 NOTE — Assessment & Plan Note (Signed)
>>  ASSESSMENT AND PLAN FOR MIXED DIABETIC HYPERLIPIDEMIA ASSOCIATED WITH TYPE 2 DIABETES MELLITUS ([facility]CC) WRITTEN ON 07/26/2014  5:36 PM BY BLYTH, STACEY A, MD  hgba1c acceptable, minimize simple carbs. Increase exercise as tolerated.

## 2014-07-26 NOTE — Assessment & Plan Note (Signed)
Tolerating statin, encouraged heart healthy diet, avoid trans fats, minimize simple carbs and saturated fats. Increase exercise as tolerated 

## 2014-07-26 NOTE — Assessment & Plan Note (Signed)
Is using less Alprazolam, continue prn use. Declines SSRI says she feels fairly well

## 2014-07-26 NOTE — Assessment & Plan Note (Signed)
Well controlled, no changes to meds. Encouraged heart healthy diet such as the DASH diet and exercise as tolerated.  °

## 2014-08-08 ENCOUNTER — Encounter: Payer: Self-pay | Admitting: Family Medicine

## 2014-08-10 ENCOUNTER — Other Ambulatory Visit: Payer: Self-pay

## 2014-08-10 DIAGNOSIS — Z1231 Encounter for screening mammogram for malignant neoplasm of breast: Secondary | ICD-10-CM

## 2014-08-11 ENCOUNTER — Ambulatory Visit (HOSPITAL_COMMUNITY)
Admission: RE | Admit: 2014-08-11 | Discharge: 2014-08-11 | Disposition: A | Discharge status: Home / Self Care | Payer: BC Managed Care – PPO | Source: Ambulatory Visit | Attending: Family Medicine | Admitting: Family Medicine

## 2014-08-11 ENCOUNTER — Ambulatory Visit (HOSPITAL_BASED_OUTPATIENT_CLINIC_OR_DEPARTMENT_OTHER): Payer: BC Managed Care – PPO

## 2014-08-11 DIAGNOSIS — Z1231 Encounter for screening mammogram for malignant neoplasm of breast: Secondary | ICD-10-CM | POA: Diagnosis present

## 2014-08-29 ENCOUNTER — Ambulatory Visit: Payer: BC Managed Care – PPO

## 2014-09-15 ENCOUNTER — Ambulatory Visit: Payer: BC Managed Care – PPO | Admitting: Family Medicine

## 2014-11-27 ENCOUNTER — Ambulatory Visit (INDEPENDENT_AMBULATORY_CARE_PROVIDER_SITE_OTHER): Payer: BLUE CROSS/BLUE SHIELD | Admitting: Family Medicine

## 2014-11-27 ENCOUNTER — Encounter: Payer: Self-pay | Admitting: Family Medicine

## 2014-11-27 VITALS — BP 114/77 | HR 86 | Temp 98.2°F | Ht 61.75 in | Wt 156.0 lb

## 2014-11-27 DIAGNOSIS — R030 Elevated blood-pressure reading, without diagnosis of hypertension: Secondary | ICD-10-CM

## 2014-11-27 DIAGNOSIS — Z0000 Encounter for general adult medical examination without abnormal findings: Secondary | ICD-10-CM

## 2014-11-27 DIAGNOSIS — L659 Nonscarring hair loss, unspecified: Secondary | ICD-10-CM

## 2014-11-27 DIAGNOSIS — F411 Generalized anxiety disorder: Secondary | ICD-10-CM

## 2014-11-27 DIAGNOSIS — E782 Mixed hyperlipidemia: Secondary | ICD-10-CM

## 2014-11-27 DIAGNOSIS — C44722 Squamous cell carcinoma of skin of right lower limb, including hip: Secondary | ICD-10-CM

## 2014-11-27 DIAGNOSIS — N649 Disorder of breast, unspecified: Secondary | ICD-10-CM

## 2014-11-27 DIAGNOSIS — R739 Hyperglycemia, unspecified: Secondary | ICD-10-CM

## 2014-11-27 DIAGNOSIS — E785 Hyperlipidemia, unspecified: Secondary | ICD-10-CM

## 2014-11-27 DIAGNOSIS — K219 Gastro-esophageal reflux disease without esophagitis: Secondary | ICD-10-CM

## 2014-11-27 DIAGNOSIS — G4700 Insomnia, unspecified: Secondary | ICD-10-CM

## 2014-11-27 DIAGNOSIS — F418 Other specified anxiety disorders: Secondary | ICD-10-CM

## 2014-11-27 LAB — LIPID PANEL
Cholesterol: 183 mg/dL (ref 0–200)
HDL: 50.4 mg/dL (ref >39.00)
NONHDL: 132.6
Total CHOL/HDL Ratio: 4
Triglycerides: 212 mg/dL — ABNORMAL HIGH (ref 0.0–149.0)
VLDL: 42.4 mg/dL — AB (ref 0.0–40.0)

## 2014-11-27 LAB — COMPREHENSIVE METABOLIC PANEL
ALK PHOS: 45 U/L (ref 39–117)
ALT: 30 U/L (ref 0–35)
AST: 25 U/L (ref 0–37)
Albumin: 4.7 g/dL (ref 3.5–5.2)
BILIRUBIN TOTAL: 0.6 mg/dL (ref 0.2–1.2)
BUN: 16 mg/dL (ref 6–23)
CO2: 33 mEq/L — ABNORMAL HIGH (ref 19–32)
Calcium: 9.8 mg/dL (ref 8.4–10.5)
Chloride: 100 mEq/L (ref 96–112)
Creatinine, Ser: 0.85 mg/dL (ref 0.40–1.20)
GFR: 71.45 mL/min (ref >60.00)
Glucose, Bld: 86 mg/dL (ref 70–99)
POTASSIUM: 4 meq/L (ref 3.5–5.1)
SODIUM: 139 meq/L (ref 135–145)
Total Protein: 7.3 g/dL (ref 6.0–8.3)

## 2014-11-27 LAB — CBC
HCT: 43.6 % (ref 36.0–46.0)
Hemoglobin: 14.9 g/dL (ref 12.0–15.0)
MCHC: 34.1 g/dL (ref 30.0–36.0)
MCV: 83.8 fl (ref 78.0–100.0)
Platelets: 238 10*3/uL (ref 150.0–400.0)
RBC: 5.2 Mil/uL — AB (ref 3.87–5.11)
RDW: 13.8 % (ref 11.5–15.5)
WBC: 5.5 10*3/uL (ref 4.0–10.5)

## 2014-11-27 LAB — TSH: TSH: 1.72 u[IU]/mL (ref 0.35–4.50)

## 2014-11-27 LAB — HEMOGLOBIN A1C: HEMOGLOBIN A1C: 6.2 % (ref 4.6–6.5)

## 2014-11-27 LAB — LDL CHOLESTEROL, DIRECT: LDL DIRECT: 103 mg/dL

## 2014-11-27 MED ORDER — ZOLPIDEM TARTRATE 10 MG PO TABS: 10.0000 mg | ORAL_TABLET | Freq: Every evening | ORAL | Status: DC | PRN

## 2014-11-27 MED ORDER — LOVASTATIN 40 MG PO TABS: 40.0000 mg | ORAL_TABLET | Freq: Every day | ORAL | Status: DC

## 2014-11-27 MED ORDER — ALPRAZOLAM 1 MG PO TABS: 1.0000 mg | ORAL_TABLET | Freq: Two times a day (BID) | ORAL | Status: DC | PRN

## 2014-11-27 NOTE — Assessment & Plan Note (Signed)
Saw dermatology, Shelly Rubenstein and was diagnosed with [facility] which was excised from posterior right calf

## 2014-11-27 NOTE — Assessment & Plan Note (Signed)
Well controlled. Encouraged heart healthy diet such as the DASH diet and exercise as tolerated.  

## 2014-11-27 NOTE — Progress Notes (Signed)
Pre visit review using our clinic review tool, if applicable. No additional management support is needed unless otherwise documented below in the visit note. 

## 2014-11-27 NOTE — Assessment & Plan Note (Signed)
Avoid offending foods, start probiotics. Do not eat large meals in late evening and consider raising head of bed.  

## 2014-11-27 NOTE — Patient Instructions (Signed)
Preventive Care for Adults A healthy lifestyle and preventive care can promote health and wellness. Preventive health guidelines for women include the following key practices.  A routine yearly physical is a good way to check with your health care provider about your health and preventive screening. It is a chance to share any concerns and updates on your health and to receive a thorough exam.  Visit your dentist for a routine exam and preventive care every 6 months. Brush your teeth twice a day and floss once a day. Good oral hygiene prevents tooth decay and gum disease.  The frequency of eye exams is based on your age, health, family medical history, use of contact lenses, and other factors. Follow your health care provider's recommendations for frequency of eye exams.  Eat a healthy diet. Foods like vegetables, fruits, whole grains, low-fat dairy products, and lean protein foods contain the nutrients you need without too many calories. Decrease your intake of foods high in solid fats, added sugars, and salt. Eat the right amount of calories for you.Get information about a proper diet from your health care provider, if necessary.  Regular physical exercise is one of the most important things you can do for your health. Most adults should get at least 150 minutes of moderate-intensity exercise (any activity that increases your heart rate and causes you to sweat) each week. In addition, most adults need muscle-strengthening exercises on 2 or more days a week.  Maintain a healthy weight. The body mass index (BMI) is a screening tool to identify possible weight problems. It provides an estimate of body fat based on height and weight. Your health care provider can find your BMI and can help you achieve or maintain a healthy weight.For adults 20 years and older:  A BMI below 18.5 is considered underweight.  A BMI of 18.5 to 24.9 is normal.  A BMI of 25 to 29.9 is considered overweight.  A BMI of  30 and above is considered obese.  Maintain normal blood lipids and cholesterol levels by exercising and minimizing your intake of saturated fat. Eat a balanced diet with plenty of fruit and vegetables. Blood tests for lipids and cholesterol should begin at age 76 and be repeated every 5 years. If your lipid or cholesterol levels are high, you are over 50, or you are at high risk for heart disease, you may need your cholesterol levels checked more frequently.Ongoing high lipid and cholesterol levels should be treated with medicines if diet and exercise are not working.  If you smoke, find out from your health care provider how to quit. If you do not use tobacco, do not start.  Lung cancer screening is recommended for adults aged 22-80 years who are at high risk for developing lung cancer because of a history of smoking. A yearly low-dose CT scan of the lungs is recommended for people who have at least a 30-pack-year history of smoking and are a current smoker or have quit within the past 15 years. A pack year of smoking is smoking an average of 1 pack of cigarettes a day for 1 year (for example: 1 pack a day for 30 years or 2 packs a day for 15 years). Yearly screening should continue until the smoker has stopped smoking for at least 15 years. Yearly screening should be stopped for people who develop a health problem that would prevent them from having lung cancer treatment.  If you are pregnant, do not drink alcohol. If you are breastfeeding,  be very cautious about drinking alcohol. If you are not pregnant and choose to drink alcohol, do not have more than 1 drink per day. One drink is considered to be 12 ounces (355 mL) of beer, 5 ounces (148 mL) of wine, or 1.5 ounces (44 mL) of liquor.  Avoid use of street drugs. Do not share needles with anyone. Ask for help if you need support or instructions about stopping the use of drugs.  High blood pressure causes heart disease and increases the risk of  stroke. Your blood pressure should be checked at least every 1 to 2 years. Ongoing high blood pressure should be treated with medicines if weight loss and exercise do not work.  If you are 3-86 years old, ask your health care provider if you should take aspirin to prevent strokes.  Diabetes screening involves taking a blood sample to check your fasting blood sugar level. This should be done once every 3 years, after age 67, if you are within normal weight and without risk factors for diabetes. Testing should be considered at a younger age or be carried out more frequently if you are overweight and have at least 1 risk factor for diabetes.  Breast cancer screening is essential preventive care for women. You should practice "breast self-awareness." This means understanding the normal appearance and feel of your breasts and may include breast self-examination. Any changes detected, no matter how small, should be reported to a health care provider. Women in their 8s and 30s should have a clinical breast exam (CBE) by a health care provider as part of a regular health exam every 1 to 3 years. After age 70, women should have a CBE every year. Starting at age 25, women should consider having a mammogram (breast X-ray test) every year. Women who have a family history of breast cancer should talk to their health care provider about genetic screening. Women at a high risk of breast cancer should talk to their health care providers about having an MRI and a mammogram every year.  Breast cancer gene (BRCA)-related cancer risk assessment is recommended for women who have family members with BRCA-related cancers. BRCA-related cancers include breast, ovarian, tubal, and peritoneal cancers. Having family members with these cancers may be associated with an increased risk for harmful changes (mutations) in the breast cancer genes BRCA1 and BRCA2. Results of the assessment will determine the need for genetic counseling and  BRCA1 and BRCA2 testing.  Routine pelvic exams to screen for cancer are no longer recommended for nonpregnant women who are considered low risk for cancer of the pelvic organs (ovaries, uterus, and vagina) and who do not have symptoms. Ask your health care provider if a screening pelvic exam is right for you.  If you have had past treatment for cervical cancer or a condition that could lead to cancer, you need Pap tests and screening for cancer for at least 20 years after your treatment. If Pap tests have been discontinued, your risk factors (such as having a new sexual partner) need to be reassessed to determine if screening should be resumed. Some women have medical problems that increase the chance of getting cervical cancer. In these cases, your health care provider may recommend more frequent screening and Pap tests.  The HPV test is an additional test that may be used for cervical cancer screening. The HPV test looks for the virus that can cause the cell changes on the cervix. The cells collected during the Pap test can be  tested for HPV. The HPV test could be used to screen women aged 30 years and older, and should be used in women of any age who have unclear Pap test results. After the age of 30, women should have HPV testing at the same frequency as a Pap test.  Colorectal cancer can be detected and often prevented. Most routine colorectal cancer screening begins at the age of 50 years and continues through age 75 years. However, your health care provider may recommend screening at an earlier age if you have risk factors for colon cancer. On a yearly basis, your health care provider may provide home test kits to check for hidden blood in the stool. Use of a small camera at the end of a tube, to directly examine the colon (sigmoidoscopy or colonoscopy), can detect the earliest forms of colorectal cancer. Talk to your health care provider about this at age 50, when routine screening begins. Direct  exam of the colon should be repeated every 5-10 years through age 75 years, unless early forms of pre-cancerous polyps or small growths are found.  People who are at an increased risk for hepatitis B should be screened for this virus. You are considered at high risk for hepatitis B if:  You were born in a country where hepatitis B occurs often. Talk with your health care provider about which countries are considered high risk.  Your parents were born in a high-risk country and you have not received a shot to protect against hepatitis B (hepatitis B vaccine).  You have HIV or AIDS.  You use needles to inject street drugs.  You live with, or have sex with, someone who has hepatitis B.  You get hemodialysis treatment.  You take certain medicines for conditions like cancer, organ transplantation, and autoimmune conditions.  Hepatitis C blood testing is recommended for all people born from 1945 through 1965 and any individual with known risks for hepatitis C.  Practice safe sex. Use condoms and avoid high-risk sexual practices to reduce the spread of sexually transmitted infections (STIs). STIs include gonorrhea, chlamydia, syphilis, trichomonas, herpes, HPV, and human immunodeficiency virus (HIV). Herpes, HIV, and HPV are viral illnesses that have no cure. They can result in disability, cancer, and death.  You should be screened for sexually transmitted illnesses (STIs) including gonorrhea and chlamydia if:  You are sexually active and are younger than 24 years.  You are older than 24 years and your health care provider tells you that you are at risk for this type of infection.  Your sexual activity has changed since you were last screened and you are at an increased risk for chlamydia or gonorrhea. Ask your health care provider if you are at risk.  If you are at risk of being infected with HIV, it is recommended that you take a prescription medicine daily to prevent HIV infection. This is  called preexposure prophylaxis (PrEP). You are considered at risk if:  You are a heterosexual woman, are sexually active, and are at increased risk for HIV infection.  You take drugs by injection.  You are sexually active with a partner who has HIV.  Talk with your health care provider about whether you are at high risk of being infected with HIV. If you choose to begin PrEP, you should first be tested for HIV. You should then be tested every 3 months for as long as you are taking PrEP.  Osteoporosis is a disease in which the bones lose minerals and strength   with aging. This can result in serious bone fractures or breaks. The risk of osteoporosis can be identified using a bone density scan. Women ages 65 years and over and women at risk for fractures or osteoporosis should discuss screening with their health care providers. Ask your health care provider whether you should take a calcium supplement or vitamin D to reduce the rate of osteoporosis.  Menopause can be associated with physical symptoms and risks. Hormone replacement therapy is available to decrease symptoms and risks. You should talk to your health care provider about whether hormone replacement therapy is right for you.  Use sunscreen. Apply sunscreen liberally and repeatedly throughout the day. You should seek shade when your shadow is shorter than you. Protect yourself by wearing long sleeves, pants, a wide-brimmed hat, and sunglasses year round, whenever you are outdoors.  Once a month, do a whole body skin exam, using a mirror to look at the skin on your back. Tell your health care provider of new moles, moles that have irregular borders, moles that are larger than a pencil eraser, or moles that have changed in shape or color.  Stay current with required vaccines (immunizations).  Influenza vaccine. All adults should be immunized every year.  Tetanus, diphtheria, and acellular pertussis (Td, Tdap) vaccine. Pregnant women should  receive 1 dose of Tdap vaccine during each pregnancy. The dose should be obtained regardless of the length of time since the last dose. Immunization is preferred during the 27th-36th week of gestation. An adult who has not previously received Tdap or who does not know her vaccine status should receive 1 dose of Tdap. This initial dose should be followed by tetanus and diphtheria toxoids (Td) booster doses every 10 years. Adults with an unknown or incomplete history of completing a 3-dose immunization series with Td-containing vaccines should begin or complete a primary immunization series including a Tdap dose. Adults should receive a Td booster every 10 years.  Varicella vaccine. An adult without evidence of immunity to varicella should receive 2 doses or a second dose if she has previously received 1 dose. Pregnant females who do not have evidence of immunity should receive the first dose after pregnancy. This first dose should be obtained before leaving the health care facility. The second dose should be obtained 4-8 weeks after the first dose.  Human papillomavirus (HPV) vaccine. Females aged 13-26 years who have not received the vaccine previously should obtain the 3-dose series. The vaccine is not recommended for use in pregnant females. However, pregnancy testing is not needed before receiving a dose. If a female is found to be pregnant after receiving a dose, no treatment is needed. In that case, the remaining doses should be delayed until after the pregnancy. Immunization is recommended for any person with an immunocompromised condition through the age of 26 years if she did not get any or all doses earlier. During the 3-dose series, the second dose should be obtained 4-8 weeks after the first dose. The third dose should be obtained 24 weeks after the first dose and 16 weeks after the second dose.  Zoster vaccine. One dose is recommended for adults aged 60 years or older unless certain conditions are  present.  Measles, mumps, and rubella (MMR) vaccine. Adults born before 1957 generally are considered immune to measles and mumps. Adults born in 1957 or later should have 1 or more doses of MMR vaccine unless there is a contraindication to the vaccine or there is laboratory evidence of immunity to   each of the three diseases. A routine second dose of MMR vaccine should be obtained at least 28 days after the first dose for students attending postsecondary schools, health care workers, or international travelers. People who received inactivated measles vaccine or an unknown type of measles vaccine during 1963-1967 should receive 2 doses of MMR vaccine. People who received inactivated mumps vaccine or an unknown type of mumps vaccine before 1979 and are at high risk for mumps infection should consider immunization with 2 doses of MMR vaccine. For females of childbearing age, rubella immunity should be determined. If there is no evidence of immunity, females who are not pregnant should be vaccinated. If there is no evidence of immunity, females who are pregnant should delay immunization until after pregnancy. Unvaccinated health care workers born before 1957 who lack laboratory evidence of measles, mumps, or rubella immunity or laboratory confirmation of disease should consider measles and mumps immunization with 2 doses of MMR vaccine or rubella immunization with 1 dose of MMR vaccine.  Pneumococcal 13-valent conjugate (PCV13) vaccine. When indicated, a person who is uncertain of her immunization history and has no record of immunization should receive the PCV13 vaccine. An adult aged 19 years or older who has certain medical conditions and has not been previously immunized should receive 1 dose of PCV13 vaccine. This PCV13 should be followed with a dose of pneumococcal polysaccharide (PPSV23) vaccine. The PPSV23 vaccine dose should be obtained at least 8 weeks after the dose of PCV13 vaccine. An adult aged 19  years or older who has certain medical conditions and previously received 1 or more doses of PPSV23 vaccine should receive 1 dose of PCV13. The PCV13 vaccine dose should be obtained 1 or more years after the last PPSV23 vaccine dose.  Pneumococcal polysaccharide (PPSV23) vaccine. When PCV13 is also indicated, PCV13 should be obtained first. All adults aged 65 years and older should be immunized. An adult younger than age 65 years who has certain medical conditions should be immunized. Any person who resides in a nursing home or long-term care facility should be immunized. An adult smoker should be immunized. People with an immunocompromised condition and certain other conditions should receive both PCV13 and PPSV23 vaccines. People with human immunodeficiency virus (HIV) infection should be immunized as soon as possible after diagnosis. Immunization during chemotherapy or radiation therapy should be avoided. Routine use of PPSV23 vaccine is not recommended for American Indians, [place] Natives, or people younger than 65 years unless there are medical conditions that require PPSV23 vaccine. When indicated, people who have unknown immunization and have no record of immunization should receive PPSV23 vaccine. One-time revaccination 5 years after the first dose of PPSV23 is recommended for people aged 19-64 years who have chronic kidney failure, nephrotic syndrome, asplenia, or immunocompromised conditions. People who received 1-2 doses of PPSV23 before age 65 years should receive another dose of PPSV23 vaccine at age 65 years or later if at least 5 years have passed since the previous dose. Doses of PPSV23 are not needed for people immunized with PPSV23 at or after age 65 years.  Meningococcal vaccine. Adults with asplenia or persistent complement component deficiencies should receive 2 doses of quadrivalent meningococcal conjugate (MenACWY-D) vaccine. The doses should be obtained at least 2 months apart.  Microbiologists working with certain meningococcal bacteria, military recruits, people at risk during an outbreak, and people who travel to or live in countries with a high rate of meningitis should be immunized. A first-year college student up through age   21 years who is living in a residence hall should receive a dose if she did not receive a dose on or after her 16th birthday. Adults who have certain high-risk conditions should receive one or more doses of vaccine.  Hepatitis A vaccine. Adults who wish to be protected from this disease, have certain high-risk conditions, work with hepatitis A-infected animals, work in hepatitis A research labs, or travel to or work in countries with a high rate of hepatitis A should be immunized. Adults who were previously unvaccinated and who anticipate close contact with an international adoptee during the first 60 days after arrival in the [place] from a country with a high rate of hepatitis A should be immunized.  Hepatitis B vaccine. Adults who wish to be protected from this disease, have certain high-risk conditions, may be exposed to blood or other infectious body fluids, are household contacts or sex partners of hepatitis B positive people, are clients or workers in certain care facilities, or travel to or work in countries with a high rate of hepatitis B should be immunized.  Haemophilus influenzae type b (Hib) vaccine. A previously unvaccinated person with asplenia or sickle cell disease or having a scheduled splenectomy should receive 1 dose of Hib vaccine. Regardless of previous immunization, a recipient of a hematopoietic stem cell transplant should receive a 3-dose series 6-12 months after her successful transplant. Hib vaccine is not recommended for adults with HIV infection. Preventive Services / Frequency Ages 64 to 68 years  Blood pressure check.** / Every 1 to 2 years.  Lipid and cholesterol check.** / Every 5 years beginning at age  22.  Clinical breast exam.** / Every 3 years for women in their 88s and 53s.  BRCA-related cancer risk assessment.** / For women who have family members with a BRCA-related cancer (breast, ovarian, tubal, or peritoneal cancers).  Pap test.** / Every 2 years from ages 90 through 51. Every 3 years starting at age 21 through age 56 or 3 with a history of 3 consecutive normal Pap tests.  HPV screening.** / Every 3 years from ages 24 through ages 1 to 46 with a history of 3 consecutive normal Pap tests.  Hepatitis C blood test.** / For any individual with known risks for hepatitis C.  Skin self-exam. / Monthly.  Influenza vaccine. / Every year.  Tetanus, diphtheria, and acellular pertussis (Tdap, Td) vaccine.** / Consult your health care provider. Pregnant women should receive 1 dose of Tdap vaccine during each pregnancy. 1 dose of Td every 10 years.  Varicella vaccine.** / Consult your health care provider. Pregnant females who do not have evidence of immunity should receive the first dose after pregnancy.  HPV vaccine. / 3 doses over 6 months, if 72 and younger. The vaccine is not recommended for use in pregnant females. However, pregnancy testing is not needed before receiving a dose.  Measles, mumps, rubella (MMR) vaccine.** / You need at least 1 dose of MMR if you were born in 1957 or later. You may also need a 2nd dose. For females of childbearing age, rubella immunity should be determined. If there is no evidence of immunity, females who are not pregnant should be vaccinated. If there is no evidence of immunity, females who are pregnant should delay immunization until after pregnancy.  Pneumococcal 13-valent conjugate (PCV13) vaccine.** / Consult your health care provider.  Pneumococcal polysaccharide (PPSV23) vaccine.** / 1 to 2 doses if you smoke cigarettes or if you have certain conditions.  Meningococcal vaccine.** /  1 dose if you are age 19 to 21 years and a first-year college  student living in a residence hall, or have one of several medical conditions, you need to get vaccinated against meningococcal disease. You may also need additional booster doses.  Hepatitis A vaccine.** / Consult your health care provider.  Hepatitis B vaccine.** / Consult your health care provider.  Haemophilus influenzae type b (Hib) vaccine.** / Consult your health care provider. Ages 40 to 64 years  Blood pressure check.** / Every 1 to 2 years.  Lipid and cholesterol check.** / Every 5 years beginning at age 20 years.  Lung cancer screening. / Every year if you are aged 55-80 years and have a 30-pack-year history of smoking and currently smoke or have quit within the past 15 years. Yearly screening is stopped once you have quit smoking for at least 15 years or develop a health problem that would prevent you from having lung cancer treatment.  Clinical breast exam.** / Every year after age 40 years.  BRCA-related cancer risk assessment.** / For women who have family members with a BRCA-related cancer (breast, ovarian, tubal, or peritoneal cancers).  Mammogram.** / Every year beginning at age 40 years and continuing for as long as you are in good health. Consult with your health care provider.  Pap test.** / Every 3 years starting at age 30 years through age 65 or 70 years with a history of 3 consecutive normal Pap tests.  HPV screening.** / Every 3 years from ages 30 years through ages 65 to 70 years with a history of 3 consecutive normal Pap tests.  Fecal occult blood test (FOBT) of stool. / Every year beginning at age 50 years and continuing until age 75 years. You may not need to do this test if you get a colonoscopy every 10 years.  Flexible sigmoidoscopy or colonoscopy.** / Every 5 years for a flexible sigmoidoscopy or every 10 years for a colonoscopy beginning at age 50 years and continuing until age 75 years.  Hepatitis C blood test.** / For all people born from 1945 through  1965 and any individual with known risks for hepatitis C.  Skin self-exam. / Monthly.  Influenza vaccine. / Every year.  Tetanus, diphtheria, and acellular pertussis (Tdap/Td) vaccine.** / Consult your health care provider. Pregnant women should receive 1 dose of Tdap vaccine during each pregnancy. 1 dose of Td every 10 years.  Varicella vaccine.** / Consult your health care provider. Pregnant females who do not have evidence of immunity should receive the first dose after pregnancy.  Zoster vaccine.** / 1 dose for adults aged 60 years or older.  Measles, mumps, rubella (MMR) vaccine.** / You need at least 1 dose of MMR if you were born in 1957 or later. You may also need a 2nd dose. For females of childbearing age, rubella immunity should be determined. If there is no evidence of immunity, females who are not pregnant should be vaccinated. If there is no evidence of immunity, females who are pregnant should delay immunization until after pregnancy.  Pneumococcal 13-valent conjugate (PCV13) vaccine.** / Consult your health care provider.  Pneumococcal polysaccharide (PPSV23) vaccine.** / 1 to 2 doses if you smoke cigarettes or if you have certain conditions.  Meningococcal vaccine.** / Consult your health care provider.  Hepatitis A vaccine.** / Consult your health care provider.  Hepatitis B vaccine.** / Consult your health care provider.  Haemophilus influenzae type b (Hib) vaccine.** / Consult your health care provider. Ages 65   years and over  Blood pressure check.** / Every 1 to 2 years.  Lipid and cholesterol check.** / Every 5 years beginning at age 22 years.  Lung cancer screening. / Every year if you are aged 73-80 years and have a 30-pack-year history of smoking and currently smoke or have quit within the past 15 years. Yearly screening is stopped once you have quit smoking for at least 15 years or develop a health problem that would prevent you from having lung cancer  treatment.  Clinical breast exam.** / Every year after age 4 years.  BRCA-related cancer risk assessment.** / For women who have family members with a BRCA-related cancer (breast, ovarian, tubal, or peritoneal cancers).  Mammogram.** / Every year beginning at age 40 years and continuing for as long as you are in good health. Consult with your health care provider.  Pap test.** / Every 3 years starting at age 9 years through age 34 or 91 years with 3 consecutive normal Pap tests. Testing can be stopped between 65 and 70 years with 3 consecutive normal Pap tests and no abnormal Pap or HPV tests in the past 10 years.  HPV screening.** / Every 3 years from ages 57 years through ages 64 or 45 years with a history of 3 consecutive normal Pap tests. Testing can be stopped between 65 and 70 years with 3 consecutive normal Pap tests and no abnormal Pap or HPV tests in the past 10 years.  Fecal occult blood test (FOBT) of stool. / Every year beginning at age 15 years and continuing until age 17 years. You may not need to do this test if you get a colonoscopy every 10 years.  Flexible sigmoidoscopy or colonoscopy.** / Every 5 years for a flexible sigmoidoscopy or every 10 years for a colonoscopy beginning at age 86 years and continuing until age 71 years.  Hepatitis C blood test.** / For all people born from [birth year] through 1966 and any individual with known risks for hepatitis C.  Osteoporosis screening.** / A one-time screening for women ages 83 years and over and women at risk for fractures or osteoporosis.  Skin self-exam. / Monthly.  Influenza vaccine. / Every year.  Tetanus, diphtheria, and acellular pertussis (Tdap/Td) vaccine.** / 1 dose of Td every 10 years.  Varicella vaccine.** / Consult your health care provider.  Zoster vaccine.** / 1 dose for adults aged 61 years or older.  Pneumococcal 13-valent conjugate (PCV13) vaccine.** / Consult your health care provider.  Pneumococcal  polysaccharide (PPSV23) vaccine.** / 1 dose for all adults aged 28 years and older.  Meningococcal vaccine.** / Consult your health care provider.  Hepatitis A vaccine.** / Consult your health care provider.  Hepatitis B vaccine.** / Consult your health care provider.  Haemophilus influenzae type b (Hib) vaccine.** / Consult your health care provider. ** Family history and personal history of risk and conditions may change your health care provider's recommendations. Document Released: 12/09/2001 Document Revised: 02/27/2014 Document Reviewed: 03/10/2011 [facility] Patient Information 2015 [place], [place]. This information is not intended to replace advice given to you by your health care provider. Make sure you discuss any questions you have with your health care provider.

## 2014-12-03 ENCOUNTER — Encounter: Payer: Self-pay | Admitting: Family Medicine

## 2014-12-03 NOTE — Progress Notes (Signed)
Tricia Fleming  [id number] 1950-11-02 12/03/2014      Progress Note-Follow Up  Subjective  Chief Complaint  Chief Complaint  Patient presents with  . Annual Exam    no pap smear,fasting    HPI  Patient is a 65 y.o. female in today for routine medical care. Patient is in today for annual exam. Feeling well. No recent illness. Did recently have a squamous cell carcinoma removed from her right leg. Had a mammogram in October 2015 and is following with certain for her breasts concerns. Colonoscopy was in 2011 and she denies any GI concerns. Denies CP/palp/SOB/HA/congestion/fevers/GI or GU c/o. Taking meds as prescribed Past Medical History  Diagnosis Date  . Hyperlipidemia   . Anxiety   . Preventative health care 06/27/2011  . Depression with anxiety 06/27/2011  . Depression   . Breast lesion 06/18/2010    Qualifier: Diagnosis of  By: Charlett Blake MD, Erline Levine    . Shoulder pain, bilateral 02/13/2013  . Hyperglycemia 07/27/2013  . SCC (squamous cell carcinoma), leg 05/04/2014    SCC   . Preventative health care 06/27/2011    Past Surgical History  Procedure Laterality Date  . Tubal ligation      Family History  Problem Relation Age of Onset  . Cancer Father     metastic prostate  . Diabetes Mother   . Hypertension Mother   . Coronary artery disease Mother   . Transient ischemic attack Sister   . Heart disease Brother   . Benign prostatic hyperplasia Brother   . Lung disease Brother     in a smoker  . Insulin resistance Brother   . Hypertension Sister   . Benign prostatic hyperplasia Brother   . Alcohol abuse Brother   . Arthritis Sister   . Esophageal varices Brother   . Cirrhosis Brother   . Diabetes Maternal Grandmother   . Coronary artery disease Maternal Grandmother   . Diabetes Maternal Grandfather   . Cancer Maternal Aunt   . Birth defects Paternal Aunt     breast    History   Social History  . Marital Status: Married    Spouse Name: N/A    Number of  Children: N/A  . Years of Education: N/A   Occupational History  . Not on file.   Social History Main Topics  . Smoking status: Never Smoker   . Smokeless tobacco: Never Used  . Alcohol Use: 0.0 oz/week    0 drink(s) per week     Comment: a couple beers a week  . Drug Use: No  . Sexual Activity:    Partners: Male   Other Topics Concern  . Not on file   Social History Narrative    Current Outpatient Prescriptions on File Prior to Visit  Medication Sig Dispense Refill  . fish oil-omega-3 fatty acids 1000 MG capsule Take 1,000 mg by mouth daily. 2 caps      No current facility-administered medications on file prior to visit.    No Known Allergies  Review of Systems  Review of Systems  Constitutional: Negative for fever, chills and malaise/fatigue.  HENT: Negative for congestion, hearing loss and nosebleeds.   Eyes: Negative for discharge.  Respiratory: Negative for cough, sputum production, shortness of breath and wheezing.   Cardiovascular: Negative for chest pain, palpitations and leg swelling.  Gastrointestinal: Negative for heartburn, nausea, vomiting, abdominal pain, diarrhea, constipation and blood in stool.  Genitourinary: Negative for dysuria, urgency, frequency and hematuria.  Musculoskeletal: Negative  for myalgias, back pain and falls.  Skin: Negative for rash.  Neurological: Negative for dizziness, tremors, sensory change, focal weakness, loss of consciousness, weakness and headaches.  Endo/Heme/Allergies: Negative for polydipsia. Does not bruise/bleed easily.  Psychiatric/Behavioral: Negative for depression and suicidal ideas. The patient is nervous/anxious. The patient does not have insomnia.     Objective  BP 114/77 mmHg  Pulse 86  Temp(Src) 98.2 F (36.8 C) (Oral)  Ht 5' 1.75" (1.568 m)  Wt 156 lb (70.761 kg)  BMI 28.78 kg/m2  SpO2 94%  Physical Exam  Physical Exam  Constitutional: She is oriented to person, place, and time and well-developed,  well-nourished, and in no distress. No distress.  HENT:  Head: Normocephalic and atraumatic.  Right Ear: External ear normal.  Left Ear: External ear normal.  Nose: Nose normal.  Mouth/Throat: Oropharynx is clear and moist. No oropharyngeal exudate.  Eyes: Conjunctivae are normal. Pupils are equal, round, and reactive to light. Right eye exhibits no discharge. Left eye exhibits no discharge. No scleral icterus.  Neck: Normal range of motion. Neck supple. No thyromegaly present.  Cardiovascular: Normal rate, regular rhythm, normal heart sounds and intact distal pulses.   No murmur heard. Pulmonary/Chest: Effort normal and breath sounds normal. No respiratory distress. She has no wheezes. She has no rales.  Abdominal: Soft. Bowel sounds are normal. She exhibits no distension and no mass. There is no tenderness.  Musculoskeletal: Normal range of motion. She exhibits no edema or tenderness.  Lymphadenopathy:    She has no cervical adenopathy.  Neurological: She is alert and oriented to person, place, and time. She has normal reflexes. No cranial nerve deficit. Coordination normal.  Skin: Skin is warm and dry. No rash noted. She is not diaphoretic.  Psychiatric: Mood, memory and affect normal.    Lab Results  Component Value Date   TSH 1.72 11/27/2014   Lab Results  Component Value Date   WBC 5.5 11/27/2014   HGB 14.9 11/27/2014   HCT 43.6 11/27/2014   MCV 83.8 11/27/2014   PLT 238.0 11/27/2014   Lab Results  Component Value Date   CREATININE 0.85 11/27/2014   BUN 16 11/27/2014   NA 139 11/27/2014   K 4.0 11/27/2014   CL 100 11/27/2014   CO2 33* 11/27/2014   Lab Results  Component Value Date   ALT 30 11/27/2014   AST 25 11/27/2014   ALKPHOS 45 11/27/2014   BILITOT 0.6 11/27/2014   Lab Results  Component Value Date   CHOL 183 11/27/2014   Lab Results  Component Value Date   HDL 50.40 11/27/2014   Lab Results  Component Value Date   LDLCALC 120* 07/24/2014   Lab  Results  Component Value Date   TRIG 212.0* 11/27/2014   Lab Results  Component Value Date   CHOLHDL 4 11/27/2014     Assessment & Plan  GERD Avoid offending foods, start probiotics. Do not eat large meals in late evening and consider raising head of bed.    SCC (squamous cell carcinoma), leg Saw dermatology, Shelly Rubenstein and was diagnosed with SCC which was excised from posterior right calf   ELEVATED BP READING WITHOUT DX HYPERTENSION Well controlled. Encouraged heart healthy diet such as the DASH diet and exercise as tolerated.    Mixed hyperlipidemia Tolerating statin, encouraged heart healthy diet, avoid trans fats, minimize simple carbs and saturated fats. Increase exercise as tolerated   Preventative health care Patient encouraged to maintain heart healthy diet, regular exercise, adequate  sleep. Consider daily probiotics. Take medications as prescribed.    Breast lesion Following with Dr Molli Posey   Depression with anxiety Continues to decline a daily SSRI and may continue prn Alprazolam but encouraged to try and decrease use.

## 2014-12-03 NOTE — Assessment & Plan Note (Signed)
Continues to decline a daily SSRI and may continue prn Alprazolam but encouraged to try and decrease use.

## 2014-12-03 NOTE — Assessment & Plan Note (Signed)
Tolerating statin, encouraged heart healthy diet, avoid trans fats, minimize simple carbs and saturated fats. Increase exercise as tolerated 

## 2014-12-03 NOTE — Assessment & Plan Note (Signed)
Patient encouraged to maintain heart healthy diet, regular exercise, adequate sleep. Consider daily probiotics. Take medications as prescribed 

## 2014-12-03 NOTE — Assessment & Plan Note (Signed)
Following with Dr Molli Posey

## 2014-12-22 ENCOUNTER — Telehealth: Payer: Self-pay | Admitting: Family Medicine

## 2014-12-22 NOTE — Telephone Encounter (Signed)
Patient informed and stated most was paid for and no big deal really at this time.  She stated she would bring with her at her next scheduled appt.

## 2014-12-22 NOTE — Telephone Encounter (Signed)
Caller name: Amy from [place] Relation to pt: Call back number: [phone]  Pharmacy:  Reason for call:   Wants to know if all labs from 11/27/14 should be preventative or diagnostic, or both. Some were paid at 100% but not all.

## 2014-12-22 NOTE — Telephone Encounter (Signed)
So they were both the lipid and A1c were tracking disease and some others were preventative. She should bring in her paper because I bet we can get them all paid for I just need to see what aargument they used to not pay them

## 2015-03-30 ENCOUNTER — Other Ambulatory Visit: Payer: Self-pay | Admitting: Family Medicine

## 2015-03-30 DIAGNOSIS — F411 Generalized anxiety disorder: Secondary | ICD-10-CM

## 2015-03-30 DIAGNOSIS — G4700 Insomnia, unspecified: Secondary | ICD-10-CM

## 2015-03-30 MED ORDER — ZOLPIDEM TARTRATE 10 MG PO TABS: 10.0000 mg | ORAL_TABLET | Freq: Every evening | ORAL | Status: DC | PRN

## 2015-03-30 MED ORDER — ALPRAZOLAM 1 MG PO TABS
1.0000 mg | ORAL_TABLET | Freq: Two times a day (BID) | ORAL | Status: DC | PRN
Start: 2015-03-30 — End: 2015-07-06

## 2015-03-30 NOTE — Telephone Encounter (Signed)
Faxed both hardcopy's to [facility]

## 2015-03-30 NOTE — Telephone Encounter (Signed)
Requesting: Zolpidem and Alprazolam Contract  No Contract on file UDS  Given on 07/25/14  [place] Last OV   11/27/14 Last Refill  Zolpidem #30 with 2 refills on 11/27/14                    Alprazolam  #60 with 3 refills on 11/27/14 Please Advise

## 2015-04-19 ENCOUNTER — Encounter: Payer: Self-pay | Admitting: Family Medicine

## 2015-05-28 ENCOUNTER — Ambulatory Visit: Payer: BLUE CROSS/BLUE SHIELD | Admitting: Family Medicine

## 2015-07-06 ENCOUNTER — Ambulatory Visit (INDEPENDENT_AMBULATORY_CARE_PROVIDER_SITE_OTHER): Payer: BLUE CROSS/BLUE SHIELD | Admitting: Family Medicine

## 2015-07-06 ENCOUNTER — Encounter: Payer: Self-pay | Admitting: Family Medicine

## 2015-07-06 VITALS — BP 118/86 | HR 82 | Temp 98.6°F | Ht 62.0 in | Wt 153.2 lb

## 2015-07-06 DIAGNOSIS — R030 Elevated blood-pressure reading, without diagnosis of hypertension: Secondary | ICD-10-CM | POA: Diagnosis not present

## 2015-07-06 DIAGNOSIS — E782 Mixed hyperlipidemia: Secondary | ICD-10-CM

## 2015-07-06 DIAGNOSIS — Z0000 Encounter for general adult medical examination without abnormal findings: Secondary | ICD-10-CM

## 2015-07-06 DIAGNOSIS — F411 Generalized anxiety disorder: Secondary | ICD-10-CM

## 2015-07-06 DIAGNOSIS — K219 Gastro-esophageal reflux disease without esophagitis: Secondary | ICD-10-CM

## 2015-07-06 DIAGNOSIS — Z780 Asymptomatic menopausal state: Secondary | ICD-10-CM

## 2015-07-06 DIAGNOSIS — G4700 Insomnia, unspecified: Secondary | ICD-10-CM | POA: Diagnosis not present

## 2015-07-06 DIAGNOSIS — Z23 Encounter for immunization: Secondary | ICD-10-CM

## 2015-07-06 DIAGNOSIS — R739 Hyperglycemia, unspecified: Secondary | ICD-10-CM

## 2015-07-06 LAB — CBC
HCT: 44.2 % (ref 36.0–46.0)
HEMOGLOBIN: 14.9 g/dL (ref 12.0–15.0)
MCHC: 33.6 g/dL (ref 30.0–36.0)
MCV: 86.1 fl (ref 78.0–100.0)
PLATELETS: 225 10*3/uL (ref 150.0–400.0)
RBC: 5.13 Mil/uL — ABNORMAL HIGH (ref 3.87–5.11)
RDW: 13.6 % (ref 11.5–15.5)
WBC: 4.9 10*3/uL (ref 4.0–10.5)

## 2015-07-06 LAB — COMPREHENSIVE METABOLIC PANEL
ALT: 34 U/L (ref 0–35)
AST: 23 U/L (ref 0–37)
Albumin: 4.8 g/dL (ref 3.5–5.2)
Alkaline Phosphatase: 56 U/L (ref 39–117)
BUN: 18 mg/dL (ref 6–23)
CHLORIDE: 102 meq/L (ref 96–112)
CO2: 33 meq/L — AB (ref 19–32)
CREATININE: 0.91 mg/dL (ref 0.40–1.20)
Calcium: 10.3 mg/dL (ref 8.4–10.5)
GFR: 65.92 mL/min (ref >60.00)
GLUCOSE: 108 mg/dL — AB (ref 70–99)
Potassium: 5.1 mEq/L (ref 3.5–5.1)
SODIUM: 142 meq/L (ref 135–145)
Total Bilirubin: 0.5 mg/dL (ref 0.2–1.2)
Total Protein: 7.4 g/dL (ref 6.0–8.3)

## 2015-07-06 LAB — HEMOGLOBIN A1C: Hgb A1c MFr Bld: 5.8 % (ref 4.6–6.5)

## 2015-07-06 LAB — LIPID PANEL
CHOL/HDL RATIO: 3
Cholesterol: 177 mg/dL (ref 0–200)
HDL: 55.4 mg/dL (ref >39.00)
LDL CALC: 95 mg/dL (ref 0–99)
NonHDL: 121.38
Triglycerides: 133 mg/dL (ref 0.0–149.0)
VLDL: 26.6 mg/dL (ref 0.0–40.0)

## 2015-07-06 LAB — MICROALBUMIN / CREATININE URINE RATIO
Creatinine,U: 42.9 mg/dL
Microalb Creat Ratio: 1.6 mg/g (ref 0.0–30.0)
Microalb, Ur: <0.7 mg/dL (ref 0.0–1.9)

## 2015-07-06 LAB — TSH: TSH: 1.62 u[IU]/mL (ref 0.35–4.50)

## 2015-07-06 MED ORDER — ZOLPIDEM TARTRATE 10 MG PO TABS: 10.0000 mg | ORAL_TABLET | Freq: Every evening | ORAL | Status: DC | PRN

## 2015-07-06 MED ORDER — LOVASTATIN 40 MG PO TABS: 40.0000 mg | ORAL_TABLET | Freq: Every day | ORAL | Status: DC

## 2015-07-06 MED ORDER — SERTRALINE HCL 50 MG PO TABS: 50.0000 mg | ORAL_TABLET | Freq: Every day | ORAL | Status: DC

## 2015-07-06 MED ORDER — ALPRAZOLAM 1 MG PO TABS: 1.0000 mg | ORAL_TABLET | Freq: Two times a day (BID) | ORAL | Status: DC | PRN

## 2015-07-06 NOTE — Patient Instructions (Addendum)
Start Sertraline at a 1/2 tab daily x 7 days then increase to a full tab daily  Call insurance and ask if they cover Zostavax/Shingles shot we can send in prescription, wait 30 days after pneumonia shot to take Vitamin D 2000 IU daily Calcium 3 x a day preferably in diet  Depression Depression refers to feeling sad, low, down in the dumps, blue, gloomy, or empty. In general, there are two kinds of depression: 1. Normal sadness or normal grief. This kind of depression is one that we all feel from time to time after upsetting life experiences, such as the loss of a job or the ending of a relationship. This kind of depression is considered normal, is short lived, and resolves within a few days to 2 weeks. Depression experienced after the loss of a loved one (bereavement) often lasts longer than 2 weeks but normally gets better with time. 2. Clinical depression. This kind of depression lasts longer than normal sadness or normal grief or interferes with your ability to function at home, at work, and in school. It also interferes with your personal relationships. It affects almost every aspect of your life. Clinical depression is an illness. Symptoms of depression can also be caused by conditions other than those mentioned above, such as:  Physical illness. Some physical illnesses, including underactive thyroid gland (hypothyroidism), severe anemia, specific types of cancer, diabetes, uncontrolled seizures, heart and lung problems, strokes, and chronic pain are commonly associated with symptoms of depression.  Side effects of some prescription medicine. In some people, certain types of medicine can cause symptoms of depression.  Substance abuse. Abuse of alcohol and illicit drugs can cause symptoms of depression. SYMPTOMS Symptoms of normal sadness and normal grief include the following:  Feeling sad or crying for short periods of time.  Not caring about anything (apathy).  Difficulty sleeping or  sleeping too much.  No longer able to enjoy the things you used to enjoy.  Desire to be by oneself all the time (social isolation).  Lack of energy or motivation.  Difficulty concentrating or remembering.  Change in appetite or weight.  Restlessness or agitation. Symptoms of clinical depression include the same symptoms of normal sadness or normal grief and also the following symptoms:  Feeling sad or crying all the time.  Feelings of guilt or worthlessness.  Feelings of hopelessness or helplessness.  Thoughts of suicide or the desire to harm yourself (suicidal ideation).  Loss of touch with reality (psychotic symptoms). Seeing or hearing things that are not real (hallucinations) or having false beliefs about your life or the people around you (delusions and paranoia). DIAGNOSIS  The diagnosis of clinical depression is usually based on how bad the symptoms are and how long they have lasted. Your health care provider will also ask you questions about your medical history and substance use to find out if physical illness, use of prescription medicine, or substance abuse is causing your depression. Your health care provider may also order blood tests. TREATMENT  Often, normal sadness and normal grief do not require treatment. However, sometimes antidepressant medicine is given for bereavement to ease the depressive symptoms until they resolve. The treatment for clinical depression depends on how bad the symptoms are but often includes antidepressant medicine, counseling with a mental health professional, or both. Your health care provider will help to determine what treatment is best for you. Depression caused by physical illness usually goes away with appropriate medical treatment of the illness. If prescription medicine is  causing depression, talk with your health care provider about stopping the medicine, decreasing the dose, or changing to another medicine. Depression caused by the  abuse of alcohol or illicit drugs goes away when you stop using these substances. Some adults need professional help in order to stop drinking or using drugs. SEEK IMMEDIATE MEDICAL CARE IF:  You have thoughts about hurting yourself or others.  You lose touch with reality (have psychotic symptoms).  You are taking medicine for depression and have a serious side effect. FOR MORE INFORMATION  [employer] Mental [employer]ness: www.nami.[employer]: https://carter.com/ Document Released: 10/10/2000 Document Revised: 02/27/2014 Document Reviewed: 01/12/2012 [facility] Patient Information 2015 [place], [place]. This information is not intended to replace advice given to you by your health care provider. Make sure you discuss any questions you have with your health care provider.

## 2015-07-06 NOTE — Progress Notes (Signed)
Pre visit review using our clinic review tool, if applicable. No additional management support is needed unless otherwise documented below in the visit note. 

## 2015-07-07 LAB — HEPATITIS C ANTIBODY: HCV AB: NEGATIVE

## 2015-07-22 NOTE — Assessment & Plan Note (Signed)
Avoid offending foods, start probiotics. Do not eat large meals in late evening and consider raising head of bed.  

## 2015-07-22 NOTE — Assessment & Plan Note (Signed)
Well controlled, no changes to meds. Encouraged heart healthy diet such as the DASH diet and exercise as tolerated.  °

## 2015-07-22 NOTE — Assessment & Plan Note (Signed)
Tolerating statin, encouraged heart healthy diet, avoid trans fats, minimize simple carbs and saturated fats. Increase exercise as tolerated 

## 2015-07-22 NOTE — Progress Notes (Signed)
Subjective:    Patient ID: Tricia Fleming, female    DOB: 21-Jul-1951, 64 y.o.   MRN: [id number]  Chief Complaint  Patient presents with  . Follow-up    HPI Patient is in today for follow-up. Overall is doing fairly well. No recent illness or acute concerns. Does still struggle with intermittent insomnia but gets good relief with zolpidem. No recent new stressors. She does still struggle with some anxiety but feels it is manageable on current medications. Denies CP/palp/SOB/HA/congestion/fevers/GI or GU c/o. Taking meds as prescribed  Past Medical History  Diagnosis Date  . Hyperlipidemia   . Anxiety   . Preventative health care 06/27/2011  . Depression with anxiety 06/27/2011  . Depression   . Breast lesion 06/18/2010    Qualifier: Diagnosis of  By: Charlett Blake MD, Erline Levine    . Shoulder pain, bilateral 02/13/2013  . Hyperglycemia 07/27/2013  . SCC (squamous cell carcinoma), leg 05/04/2014    SCC   . Preventative health care 06/27/2011    Past Surgical History  Procedure Laterality Date  . Tubal ligation      Family History  Problem Relation Age of Onset  . Cancer Father     metastic prostate  . Diabetes Mother   . Hypertension Mother   . Coronary artery disease Mother   . Transient ischemic attack Sister   . Heart disease Brother   . Benign prostatic hyperplasia Brother   . Lung disease Brother     in a smoker  . Insulin resistance Brother   . Hypertension Sister   . Benign prostatic hyperplasia Brother   . Alcohol abuse Brother   . Arthritis Sister   . Esophageal varices Brother   . Cirrhosis Brother   . Diabetes Maternal Grandmother   . Coronary artery disease Maternal Grandmother   . Diabetes Maternal Grandfather   . Cancer Maternal Aunt   . Birth defects Paternal Aunt     breast    Social History   Social History  . Marital Status: Married    Spouse Name: N/A  . Number of Children: N/A  . Years of Education: N/A   Occupational History  . Not on file.    Social History Main Topics  . Smoking status: Never Smoker   . Smokeless tobacco: Never Used  . Alcohol Use: 0.0 oz/week    0 drink(s) per week     Comment: a couple beers a week  . Drug Use: No  . Sexual Activity:    Partners: Male   Other Topics Concern  . Not on file   Social History Narrative    Outpatient Prescriptions Prior to Visit  Medication Sig Dispense Refill  . fish oil-omega-3 fatty acids 1000 MG capsule Take 1,000 mg by mouth daily. 2 caps     . ALPRAZolam (XANAX) 1 MG tablet Take 1 tablet (1 mg total) by mouth 2 (two) times daily as needed for sleep or anxiety. 60 tablet 3  . lovastatin (MEVACOR) 40 MG tablet Take 1 tablet (40 mg total) by mouth at bedtime. 90 tablet 1  . zolpidem (AMBIEN) 10 MG tablet Take 1 tablet (10 mg total) by mouth at bedtime as needed for sleep. 30 tablet 3   No facility-administered medications prior to visit.    No Known Allergies  Review of Systems  Constitutional: Negative for fever and malaise/fatigue.  HENT: Negative for congestion.   Eyes: Negative for discharge.  Respiratory: Negative for shortness of breath.   Cardiovascular: Negative for  chest pain, palpitations and leg swelling.  Gastrointestinal: Negative for nausea and abdominal pain.  Genitourinary: Negative for dysuria.  Musculoskeletal: Negative for falls.  Skin: Negative for rash.  Neurological: Negative for loss of consciousness and headaches.  Endo/Heme/Allergies: Negative for environmental allergies.  Psychiatric/Behavioral: Negative for depression. The patient is nervous/anxious and has insomnia.        Objective:    Physical Exam  Constitutional: She is oriented to person, place, and time. She appears well-developed and well-nourished. No distress.  HENT:  Head: Normocephalic and atraumatic.  Nose: Nose normal.  Eyes: Right eye exhibits no discharge. Left eye exhibits no discharge.  Neck: Normal range of motion. Neck supple.  Cardiovascular: Normal  rate and regular rhythm.   No murmur heard. Pulmonary/Chest: Effort normal and breath sounds normal.  Abdominal: Soft. Bowel sounds are normal. There is no tenderness.  Musculoskeletal: She exhibits no edema.  Neurological: She is alert and oriented to person, place, and time.  Skin: Skin is warm and dry.  Psychiatric: She has a normal mood and affect.  Nursing note and vitals reviewed.   BP 118/86 mmHg  Pulse 82  Temp(Src) 98.6 F (37 C) (Oral)  Ht 5\' 2"  (1.575 m)  Wt 153 lb 4 oz (69.514 kg)  BMI 28.02 kg/m2  SpO2 96% Wt Readings from Last 3 Encounters:  07/06/15 153 lb 4 oz (69.514 kg)  11/27/14 156 lb (70.761 kg)  07/24/14 155 lb 9.6 oz (70.58 kg)     Lab Results  Component Value Date   WBC 4.9 07/06/2015   HGB 14.9 07/06/2015   HCT 44.2 07/06/2015   PLT 225.0 07/06/2015   GLUCOSE 108* 07/06/2015   CHOL 177 07/06/2015   TRIG 133.0 07/06/2015   HDL 55.40 07/06/2015   LDLDIRECT 103.0 11/27/2014   LDLCALC 95 07/06/2015   ALT 34 07/06/2015   AST 23 07/06/2015   NA 142 07/06/2015   K 5.1 07/06/2015   CL 102 07/06/2015   CREATININE 0.91 07/06/2015   BUN 18 07/06/2015   CO2 33* 07/06/2015   TSH 1.62 07/06/2015   HGBA1C 5.8 07/06/2015   MICROALBUR <0.7 07/06/2015    Lab Results  Component Value Date   TSH 1.62 07/06/2015   Lab Results  Component Value Date   WBC 4.9 07/06/2015   HGB 14.9 07/06/2015   HCT 44.2 07/06/2015   MCV 86.1 07/06/2015   PLT 225.0 07/06/2015   Lab Results  Component Value Date   NA 142 07/06/2015   K 5.1 07/06/2015   CO2 33* 07/06/2015   GLUCOSE 108* 07/06/2015   BUN 18 07/06/2015   CREATININE 0.91 07/06/2015   BILITOT 0.5 07/06/2015   ALKPHOS 56 07/06/2015   AST 23 07/06/2015   ALT 34 07/06/2015   PROT 7.4 07/06/2015   ALBUMIN 4.8 07/06/2015   CALCIUM 10.3 07/06/2015   GFR 65.92 07/06/2015   Lab Results  Component Value Date   CHOL 177 07/06/2015   Lab Results  Component Value Date   HDL 55.40 07/06/2015   Lab  Results  Component Value Date   LDLCALC 95 07/06/2015   Lab Results  Component Value Date   TRIG 133.0 07/06/2015   Lab Results  Component Value Date   CHOLHDL 3 07/06/2015   Lab Results  Component Value Date   HGBA1C 5.8 07/06/2015       Assessment & Plan:   Problem List Items Addressed This Visit    Preventative health care   Relevant Orders  TSH (Completed)   CBC (Completed)   Hemoglobin A1c (Completed)   Lipid panel (Completed)   Comprehensive metabolic panel (Completed)   Microalbumin / creatinine urine ratio (Completed)   Hepatitis C Antibody (Completed)   Mixed hyperlipidemia    Tolerating statin, encouraged heart healthy diet, avoid trans fats, minimize simple carbs and saturated fats. Increase exercise as tolerated      Relevant Medications   lovastatin (MEVACOR) 40 MG tablet   Other Relevant Orders   TSH (Completed)   CBC (Completed)   Hemoglobin A1c (Completed)   Lipid panel (Completed)   Comprehensive metabolic panel (Completed)   Microalbumin / creatinine urine ratio (Completed)   Insomnia    Encouraged good sleep hygiene such as dark, quiet room. No blue/green glowing lights such as computer screens in bedroom. No alcohol or stimulants in evening. Cut down on caffeine as able. Regular exercise is helpful but not just prior to bed time. May use Ambien prn      Relevant Medications   zolpidem (AMBIEN) 10 MG tablet   ALPRAZolam (XANAX) 1 MG tablet   Other Relevant Orders   TSH (Completed)   CBC (Completed)   Hemoglobin A1c (Completed)   Lipid panel (Completed)   Comprehensive metabolic panel (Completed)   Microalbumin / creatinine urine ratio (Completed)   Hyperglycemia    hgba1c acceptable, minimize simple carbs. Increase exercise as tolerated. Given flu shot and Pneumovax today      Relevant Orders   TSH (Completed)   CBC (Completed)   Hemoglobin A1c (Completed)   Lipid panel (Completed)   Comprehensive metabolic panel (Completed)    Microalbumin / creatinine urine ratio (Completed)   GERD    Avoid offending foods, start probiotics. Do not eat large meals in late evening and consider raising head of bed.       Relevant Orders   TSH (Completed)   CBC (Completed)   Hemoglobin A1c (Completed)   Lipid panel (Completed)   Comprehensive metabolic panel (Completed)   Microalbumin / creatinine urine ratio (Completed)   ELEVATED BP READING WITHOUT DX HYPERTENSION    Well controlled, no changes to meds. Encouraged heart healthy diet such as the DASH diet and exercise as tolerated.       Relevant Orders   TSH (Completed)   CBC (Completed)   Hemoglobin A1c (Completed)   Lipid panel (Completed)   Comprehensive metabolic panel (Completed)   Microalbumin / creatinine urine ratio (Completed)    Other Visit Diagnoses    Hyperlipidemia, mixed    -  Primary    Relevant Medications    lovastatin (MEVACOR) 40 MG tablet    Other Relevant Orders    TSH (Completed)    CBC (Completed)    Hemoglobin A1c (Completed)    Lipid panel (Completed)    Comprehensive metabolic panel (Completed)    Microalbumin / creatinine urine ratio (Completed)    Anxiety state        Relevant Medications    ALPRAZolam (XANAX) 1 MG tablet    sertraline (ZOLOFT) 50 MG tablet    Other Relevant Orders    TSH (Completed)    CBC (Completed)    Hemoglobin A1c (Completed)    Lipid panel (Completed)    Comprehensive metabolic panel (Completed)    Microalbumin / creatinine urine ratio (Completed)    Encounter for immunization        Postmenopausal estrogen deficiency        Relevant Orders    DG Bone Density  Need for 23-polyvalent pneumococcal polysaccharide vaccine        Relevant Orders    Pneumococcal polysaccharide vaccine 23-valent greater than or equal to 2yo subcutaneous/IM (Completed)       I am having Ms. Bines start on sertraline. I am also having her maintain her fish oil-omega-3 fatty acids, lovastatin, zolpidem, and  ALPRAZolam.  Meds ordered this encounter  Medications  . lovastatin (MEVACOR) 40 MG tablet    Sig: Take 1 tablet (40 mg total) by mouth at bedtime.    Dispense:  90 tablet    Refill:  2  . zolpidem (AMBIEN) 10 MG tablet    Sig: Take 1 tablet (10 mg total) by mouth at bedtime as needed for sleep.    Dispense:  30 tablet    Refill:  3  . ALPRAZolam (XANAX) 1 MG tablet    Sig: Take 1 tablet (1 mg total) by mouth 2 (two) times daily as needed for sleep or anxiety.    Dispense:  60 tablet    Refill:  3  . sertraline (ZOLOFT) 50 MG tablet    Sig: Take 1 tablet (50 mg total) by mouth daily.    Dispense:  30 tablet    Refill:  3     Penni Homans, MD

## 2015-07-22 NOTE — Assessment & Plan Note (Signed)
hgba1c acceptable, minimize simple carbs. Increase exercise as tolerated. Given flu shot and Pneumovax today

## 2015-07-22 NOTE — Assessment & Plan Note (Signed)
>>  ASSESSMENT AND PLAN FOR MIXED DIABETIC HYPERLIPIDEMIA ASSOCIATED WITH TYPE 2 DIABETES MELLITUS (HCC) WRITTEN ON 07/22/2015  8:37 AM BY BLYTH, STACEY A, MD  hgba1c acceptable, minimize simple carbs. Increase exercise as tolerated. Given flu shot and Pneumovax today

## 2015-07-22 NOTE — Assessment & Plan Note (Signed)
Encouraged good sleep hygiene such as dark, quiet room. No blue/green glowing lights such as computer screens in bedroom. No alcohol or stimulants in evening. Cut down on caffeine as able. Regular exercise is helpful but not just prior to bed time.  May use Ambien prn 

## 2015-09-03 ENCOUNTER — Other Ambulatory Visit: Payer: Self-pay

## 2015-09-03 DIAGNOSIS — Z1231 Encounter for screening mammogram for malignant neoplasm of breast: Secondary | ICD-10-CM

## 2015-10-04 ENCOUNTER — Other Ambulatory Visit: Payer: Self-pay | Admitting: Family Medicine

## 2015-10-04 DIAGNOSIS — Z1231 Encounter for screening mammogram for malignant neoplasm of breast: Secondary | ICD-10-CM

## 2015-10-04 DIAGNOSIS — Z780 Asymptomatic menopausal state: Secondary | ICD-10-CM

## 2015-10-05 ENCOUNTER — Ambulatory Visit: Payer: BLUE CROSS/BLUE SHIELD | Admitting: Family Medicine

## 2015-10-16 ENCOUNTER — Ambulatory Visit (HOSPITAL_BASED_OUTPATIENT_CLINIC_OR_DEPARTMENT_OTHER)
Admission: RE | Admit: 2015-10-16 | Discharge: 2015-10-16 | Disposition: A | Discharge status: Home / Self Care | Payer: BLUE CROSS/BLUE SHIELD | Source: Ambulatory Visit | Attending: Family Medicine | Admitting: Family Medicine

## 2015-10-16 ENCOUNTER — Other Ambulatory Visit (HOSPITAL_BASED_OUTPATIENT_CLINIC_OR_DEPARTMENT_OTHER): Payer: BLUE CROSS/BLUE SHIELD

## 2015-10-16 ENCOUNTER — Ambulatory Visit (HOSPITAL_BASED_OUTPATIENT_CLINIC_OR_DEPARTMENT_OTHER): Payer: BLUE CROSS/BLUE SHIELD

## 2015-10-16 DIAGNOSIS — M8580 Other specified disorders of bone density and structure, unspecified site: Secondary | ICD-10-CM | POA: Insufficient documentation

## 2015-10-16 DIAGNOSIS — Z780 Asymptomatic menopausal state: Secondary | ICD-10-CM | POA: Insufficient documentation

## 2015-10-16 DIAGNOSIS — Z1231 Encounter for screening mammogram for malignant neoplasm of breast: Secondary | ICD-10-CM | POA: Diagnosis not present

## 2015-10-17 ENCOUNTER — Other Ambulatory Visit: Payer: Self-pay | Admitting: Family Medicine

## 2015-10-17 NOTE — Telephone Encounter (Signed)
Filled in PCP absence. Overdue for follow-up per last AVS. Further refills will be deferred to PCP.

## 2015-10-26 ENCOUNTER — Ambulatory Visit: Payer: BLUE CROSS/BLUE SHIELD

## 2015-10-26 ENCOUNTER — Other Ambulatory Visit: Payer: BLUE CROSS/BLUE SHIELD

## 2015-11-28 ENCOUNTER — Other Ambulatory Visit: Payer: Self-pay | Admitting: Family Medicine

## 2015-11-29 NOTE — Telephone Encounter (Signed)
Faxed hardcopy for both Alprazolam #60 with 1 refill And zolpidem #30 with 1 refill to [facility] in [place].

## 2016-01-14 ENCOUNTER — Telehealth: Payer: Self-pay | Admitting: General Practice

## 2016-01-14 NOTE — Telephone Encounter (Signed)
Called pt to complete pre-visit phone call. LMOVM to return call. Patient advised on message that if unable to return call to please arrive 10-15 minutes early for appointment to update all insurance and paperwork with front desk.

## 2016-01-15 ENCOUNTER — Encounter: Payer: Self-pay | Admitting: Family Medicine

## 2016-01-15 ENCOUNTER — Ambulatory Visit (INDEPENDENT_AMBULATORY_CARE_PROVIDER_SITE_OTHER): Payer: BLUE CROSS/BLUE SHIELD | Admitting: Family Medicine

## 2016-01-15 VITALS — BP 112/62 | HR 77 | Temp 98.1°F | Ht 62.0 in | Wt 148.2 lb

## 2016-01-15 DIAGNOSIS — G4700 Insomnia, unspecified: Secondary | ICD-10-CM

## 2016-01-15 DIAGNOSIS — R739 Hyperglycemia, unspecified: Secondary | ICD-10-CM | POA: Diagnosis not present

## 2016-01-15 DIAGNOSIS — Z0000 Encounter for general adult medical examination without abnormal findings: Secondary | ICD-10-CM

## 2016-01-15 DIAGNOSIS — Z23 Encounter for immunization: Secondary | ICD-10-CM

## 2016-01-15 DIAGNOSIS — E782 Mixed hyperlipidemia: Secondary | ICD-10-CM

## 2016-01-15 DIAGNOSIS — R030 Elevated blood-pressure reading, without diagnosis of hypertension: Secondary | ICD-10-CM | POA: Diagnosis not present

## 2016-01-15 DIAGNOSIS — M8580 Other specified disorders of bone density and structure, unspecified site: Secondary | ICD-10-CM | POA: Diagnosis not present

## 2016-01-15 HISTORY — DX: Other specified disorders of bone density and structure, unspecified site: M85.80

## 2016-01-15 LAB — COMPREHENSIVE METABOLIC PANEL
ALBUMIN: 4.5 g/dL (ref 3.5–5.2)
ALT: 22 U/L (ref 0–35)
AST: 18 U/L (ref 0–37)
Alkaline Phosphatase: 49 U/L (ref 39–117)
BUN: 24 mg/dL — AB (ref 6–23)
CHLORIDE: 103 meq/L (ref 96–112)
CO2: 32 meq/L (ref 19–32)
CREATININE: 0.86 mg/dL (ref 0.40–1.20)
Calcium: 9.7 mg/dL (ref 8.4–10.5)
GFR: 70.24 mL/min (ref >60.00)
GLUCOSE: 113 mg/dL — AB (ref 70–99)
Potassium: 4.3 mEq/L (ref 3.5–5.1)
SODIUM: 141 meq/L (ref 135–145)
TOTAL PROTEIN: 6.9 g/dL (ref 6.0–8.3)
Total Bilirubin: 0.3 mg/dL (ref 0.2–1.2)

## 2016-01-15 LAB — CBC
HEMATOCRIT: 40.7 % (ref 36.0–46.0)
Hemoglobin: 13.9 g/dL (ref 12.0–15.0)
MCHC: 34 g/dL (ref 30.0–36.0)
MCV: 84.1 fl (ref 78.0–100.0)
PLATELETS: 198 10*3/uL (ref 150.0–400.0)
RBC: 4.84 Mil/uL (ref 3.87–5.11)
RDW: 13.4 % (ref 11.5–15.5)
WBC: 4.1 10*3/uL (ref 4.0–10.5)

## 2016-01-15 LAB — LIPID PANEL
CHOLESTEROL: 203 mg/dL — AB (ref 0–200)
HDL: 49.9 mg/dL (ref >39.00)
LDL Cholesterol: 119 mg/dL — ABNORMAL HIGH (ref 0–99)
NONHDL: 152.89
Total CHOL/HDL Ratio: 4
Triglycerides: 169 mg/dL — ABNORMAL HIGH (ref 0.0–149.0)
VLDL: 33.8 mg/dL (ref 0.0–40.0)

## 2016-01-15 LAB — VITAMIN D 25 HYDROXY (VIT D DEFICIENCY, FRACTURES): VITD: 57.35 ng/mL (ref 30.00–100.00)

## 2016-01-15 LAB — TSH: TSH: 1.43 u[IU]/mL (ref 0.35–4.50)

## 2016-01-15 LAB — HEMOGLOBIN A1C: Hgb A1c MFr Bld: 5.9 % (ref 4.6–6.5)

## 2016-01-15 MED ORDER — ZOLPIDEM TARTRATE 10 MG PO TABS: 10.0000 mg | ORAL_TABLET | Freq: Every evening | ORAL | Status: DC | PRN

## 2016-01-15 MED ORDER — ALPRAZOLAM 1 MG PO TABS: ORAL_TABLET | ORAL | Status: DC

## 2016-01-15 NOTE — Assessment & Plan Note (Addendum)
Encouraged to get adequate exercise, calcium and vitamin d intake. Check a vitamin D level today Caltrate+ bid and Vitamin D 5000 qd

## 2016-01-15 NOTE — Assessment & Plan Note (Signed)
Did not continue Sertraline because she did not feel it made a difference. Uses Alprazolam bid.

## 2016-01-15 NOTE — Assessment & Plan Note (Signed)
Avoid offending foods, consider probiotics. Do not eat large meals in late evening and consider raising head of bed.  

## 2016-01-15 NOTE — Assessment & Plan Note (Signed)
>>  ASSESSMENT AND PLAN FOR MIXED DIABETIC HYPERLIPIDEMIA ASSOCIATED WITH TYPE 2 DIABETES MELLITUS (HCC) WRITTEN ON 01/15/2016  7:51 AM BY BLYTH, STACEY A, MD  hgba1c acceptable, minimize simple carbs. Increase exercise as tolerated.

## 2016-01-15 NOTE — Assessment & Plan Note (Signed)
Encouraged good sleep hygiene such as dark, quiet room. No blue/green glowing lights such as computer screens in bedroom. No alcohol or stimulants in evening. Cut down on caffeine as able. Regular exercise is helpful but not just prior to bed time. Allowed refill on Ambien 

## 2016-01-15 NOTE — Assessment & Plan Note (Signed)
Well controlled. Encouraged heart healthy diet such as the DASH diet and exercise as tolerated.  

## 2016-01-15 NOTE — Assessment & Plan Note (Signed)
hgba1c acceptable, minimize simple carbs. Increase exercise as tolerated.  

## 2016-01-15 NOTE — Progress Notes (Signed)
Pre visit review using our clinic review tool, if applicable. No additional management support is needed unless otherwise documented below in the visit note. 

## 2016-01-15 NOTE — Patient Instructions (Addendum)
NOW Probiotic Vitamin. Available at [facility].   Preventive Care for Adults, Female A healthy lifestyle and preventive care can promote health and wellness. Preventive health guidelines for women include the following key practices.  A routine yearly physical is a good way to check with your health care provider about your health and preventive screening. It is a chance to share any concerns and updates on your health and to receive a thorough exam.  Visit your dentist for a routine exam and preventive care every 6 months. Brush your teeth twice a day and floss once a day. Good oral hygiene prevents tooth decay and gum disease.  The frequency of eye exams is based on your age, health, family medical history, use of contact lenses, and other factors. Follow your health care provider's recommendations for frequency of eye exams.  Eat a healthy diet. Foods like vegetables, fruits, whole grains, low-fat dairy products, and lean protein foods contain the nutrients you need without too many calories. Decrease your intake of foods high in solid fats, added sugars, and salt. Eat the right amount of calories for you.Get information about a proper diet from your health care provider, if necessary.  Regular physical exercise is one of the most important things you can do for your health. Most adults should get at least 150 minutes of moderate-intensity exercise (any activity that increases your heart rate and causes you to sweat) each week. In addition, most adults need muscle-strengthening exercises on 2 or more days a week.  Maintain a healthy weight. The body mass index (BMI) is a screening tool to identify possible weight problems. It provides an estimate of body fat based on height and weight. Your health care provider can find your BMI and can help you achieve or maintain a healthy weight.For adults 20 years and older:  A BMI below 18.5 is considered underweight.  A BMI of 18.5 to 24.9 is  normal.  A BMI of 25 to 29.9 is considered overweight.  A BMI of 30 and above is considered obese.  Maintain normal blood lipids and cholesterol levels by exercising and minimizing your intake of saturated fat. Eat a balanced diet with plenty of fruit and vegetables. Blood tests for lipids and cholesterol should begin at age 20 and be repeated every 5 years. If your lipid or cholesterol levels are high, you are over 50, or you are at high risk for heart disease, you may need your cholesterol levels checked more frequently.Ongoing high lipid and cholesterol levels should be treated with medicines if diet and exercise are not working.  If you smoke, find out from your health care provider how to quit. If you do not use tobacco, do not start.  Lung cancer screening is recommended for adults aged 55-80 years who are at high risk for developing lung cancer because of a history of smoking. A yearly low-dose CT scan of the lungs is recommended for people who have at least a 30-pack-year history of smoking and are a current smoker or have quit within the past 15 years. A pack year of smoking is smoking an average of 1 pack of cigarettes a day for 1 year (for example: 1 pack a day for 30 years or 2 packs a day for 15 years). Yearly screening should continue until the smoker has stopped smoking for at least 15 years. Yearly screening should be stopped for people who develop a health problem that would prevent them from having lung cancer treatment.  If you   pregnant, do not drink alcohol. If you are breastfeeding, be very cautious about drinking alcohol. If you are not pregnant and choose to drink alcohol, do not have more than 1 drink per day. One drink is considered to be 12 ounces (355 mL) of beer, 5 ounces (148 mL) of wine, or 1.5 ounces (44 mL) of liquor.  Avoid use of street drugs. Do not share needles with anyone. Ask for help if you need support or instructions about stopping the use of  drugs.  High blood pressure causes heart disease and increases the risk of stroke. Your blood pressure should be checked at least every 1 to 2 years. Ongoing high blood pressure should be treated with medicines if weight loss and exercise do not work.  If you are 88-84 years old, ask your health care provider if you should take aspirin to prevent strokes.  Diabetes screening is done by taking a blood sample to check your blood glucose level after you have not eaten for a certain period of time (fasting). If you are not overweight and you do not have risk factors for diabetes, you should be screened once every 3 years starting at age 34. If you are overweight or obese and you are 62-54 years of age, you should be screened for diabetes every year as part of your cardiovascular risk assessment.  Breast cancer screening is essential preventive care for women. You should practice "breast self-awareness." This means understanding the normal appearance and feel of your breasts and may include breast self-examination. Any changes detected, no matter how small, should be reported to a health care provider. Women in their 44s and 30s should have a clinical breast exam (CBE) by a health care provider as part of a regular health exam every 1 to 3 years. After age 63, women should have a CBE every year. Starting at age 73, women should consider having a mammogram (breast X-ray test) every year. Women who have a family history of breast cancer should talk to their health care provider about genetic screening. Women at a high risk of breast cancer should talk to their health care providers about having an MRI and a mammogram every year.  Breast cancer gene (BRCA)-related cancer risk assessment is recommended for women who have family members with BRCA-related cancers. BRCA-related cancers include breast, ovarian, tubal, and peritoneal cancers. Having family members with these cancers may be associated with an increased  risk for harmful changes (mutations) in the breast cancer genes BRCA1 and BRCA2. Results of the assessment will determine the need for genetic counseling and BRCA1 and BRCA2 testing.  Your health care provider may recommend that you be screened regularly for cancer of the pelvic organs (ovaries, uterus, and vagina). This screening involves a pelvic examination, including checking for microscopic changes to the surface of your cervix (Pap test). You may be encouraged to have this screening done every 3 years, beginning at age 74.  For women ages 70-65, health care providers may recommend pelvic exams and Pap testing every 3 years, or they may recommend the Pap and pelvic exam, combined with testing for human papilloma virus (HPV), every 5 years. Some types of HPV increase your risk of cervical cancer. Testing for HPV may also be done on women of any age with unclear Pap test results.  Other health care providers may not recommend any screening for nonpregnant women who are considered low risk for pelvic cancer and who do not have symptoms. Ask your health care provider  if a screening pelvic exam is right for you.  If you have had past treatment for cervical cancer or a condition that could lead to cancer, you need Pap tests and screening for cancer for at least 20 years after your treatment. If Pap tests have been discontinued, your risk factors (such as having a new sexual partner) need to be reassessed to determine if screening should resume. Some women have medical problems that increase the chance of getting cervical cancer. In these cases, your health care provider may recommend more frequent screening and Pap tests.  Colorectal cancer can be detected and often prevented. Most routine colorectal cancer screening begins at the age of 37 years and continues through age 14 years. However, your health care provider may recommend screening at an earlier age if you have risk factors for colon cancer. On a  yearly basis, your health care provider may provide home test kits to check for hidden blood in the stool. Use of a small camera at the end of a tube, to directly examine the colon (sigmoidoscopy or colonoscopy), can detect the earliest forms of colorectal cancer. Talk to your health care provider about this at age 23, when routine screening begins. Direct exam of the colon should be repeated every 5-10 years through age 66 years, unless early forms of precancerous polyps or small growths are found.  People who are at an increased risk for hepatitis B should be screened for this virus. You are considered at high risk for hepatitis B if:  You were born in a country where hepatitis B occurs often. Talk with your health care provider about which countries are considered high risk.  Your parents were born in a high-risk country and you have not received a shot to protect against hepatitis B (hepatitis B vaccine).  You have HIV or AIDS.  You use needles to inject street drugs.  You live with, or have sex with, someone who has hepatitis B.  You get hemodialysis treatment.  You take certain medicines for conditions like cancer, organ transplantation, and autoimmune conditions.  Hepatitis C blood testing is recommended for all people born from [birth year] through 1965 and any individual with known risks for hepatitis C.  Practice safe sex. Use condoms and avoid high-risk sexual practices to reduce the spread of sexually transmitted infections (STIs). STIs include gonorrhea, chlamydia, syphilis, trichomonas, herpes, HPV, and human immunodeficiency virus (HIV). Herpes, HIV, and HPV are viral illnesses that have no cure. They can result in disability, cancer, and death.  You should be screened for sexually transmitted illnesses (STIs) including gonorrhea and chlamydia if:  You are sexually active and are younger than 24 years.  You are older than 24 years and your health care provider tells you that you are  at risk for this type of infection.  Your sexual activity has changed since you were last screened and you are at an increased risk for chlamydia or gonorrhea. Ask your health care provider if you are at risk.  If you are at risk of being infected with HIV, it is recommended that you take a prescription medicine daily to prevent HIV infection. This is called preexposure prophylaxis (PrEP). You are considered at risk if:  You are sexually active and do not regularly use condoms or know the HIV status of your partner(s).  You take drugs by injection.  You are sexually active with a partner who has HIV.  Talk with your health care provider about whether you are at  high risk of being infected with HIV. If you choose to begin PrEP, you should first be tested for HIV. You should then be tested every 3 months for as long as you are taking PrEP.  Osteoporosis is a disease in which the bones lose minerals and strength with aging. This can result in serious bone fractures or breaks. The risk of osteoporosis can be identified using a bone density scan. Women ages 56 years and over and women at risk for fractures or osteoporosis should discuss screening with their health care providers. Ask your health care provider whether you should take a calcium supplement or vitamin D to reduce the rate of osteoporosis.  Menopause can be associated with physical symptoms and risks. Hormone replacement therapy is available to decrease symptoms and risks. You should talk to your health care provider about whether hormone replacement therapy is right for you.  Use sunscreen. Apply sunscreen liberally and repeatedly throughout the day. You should seek shade when your shadow is shorter than you. Protect yourself by wearing long sleeves, pants, a wide-brimmed hat, and sunglasses year round, whenever you are outdoors.  Once a month, do a whole body skin exam, using a mirror to look at the skin on your back. Tell your health  care provider of new moles, moles that have irregular borders, moles that are larger than a pencil eraser, or moles that have changed in shape or color.  Stay current with required vaccines (immunizations).  Influenza vaccine. All adults should be immunized every year.  Tetanus, diphtheria, and acellular pertussis (Td, Tdap) vaccine. Pregnant women should receive 1 dose of Tdap vaccine during each pregnancy. The dose should be obtained regardless of the length of time since the last dose. Immunization is preferred during the 27th-36th week of gestation. An adult who has not previously received Tdap or who does not know her vaccine status should receive 1 dose of Tdap. This initial dose should be followed by tetanus and diphtheria toxoids (Td) booster doses every 10 years. Adults with an unknown or incomplete history of completing a 3-dose immunization series with Td-containing vaccines should begin or complete a primary immunization series including a Tdap dose. Adults should receive a Td booster every 10 years.  Varicella vaccine. An adult without evidence of immunity to varicella should receive 2 doses or a second dose if she has previously received 1 dose. Pregnant females who do not have evidence of immunity should receive the first dose after pregnancy. This first dose should be obtained before leaving the health care facility. The second dose should be obtained 4-8 weeks after the first dose.  Human papillomavirus (HPV) vaccine. Females aged 13-26 years who have not received the vaccine previously should obtain the 3-dose series. The vaccine is not recommended for use in pregnant females. However, pregnancy testing is not needed before receiving a dose. If a female is found to be pregnant after receiving a dose, no treatment is needed. In that case, the remaining doses should be delayed until after the pregnancy. Immunization is recommended for any person with an immunocompromised condition through  the age of 42 years if she did not get any or all doses earlier. During the 3-dose series, the second dose should be obtained 4-8 weeks after the first dose. The third dose should be obtained 24 weeks after the first dose and 16 weeks after the second dose.  Zoster vaccine. One dose is recommended for adults aged 105 years or older unless certain conditions are present.  Measles, mumps, and rubella (MMR) vaccine. Adults born before [birth year] generally are considered immune to measles and mumps. Adults born in [birth year] or later should have 1 or more doses of MMR vaccine unless there is a contraindication to the vaccine or there is laboratory evidence of immunity to each of the three diseases. A routine second dose of MMR vaccine should be obtained at least 28 days after the first dose for students attending postsecondary schools, health care workers, or international travelers. People who received inactivated measles vaccine or an unknown type of measles vaccine during 1963-1967 should receive 2 doses of MMR vaccine. People who received inactivated mumps vaccine or an unknown type of mumps vaccine before 1979 and are at high risk for mumps infection should consider immunization with 2 doses of MMR vaccine. For females of childbearing age, rubella immunity should be determined. If there is no evidence of immunity, females who are not pregnant should be vaccinated. If there is no evidence of immunity, females who are pregnant should delay immunization until after pregnancy. Unvaccinated health care workers born before [birth year] who lack laboratory evidence of measles, mumps, or rubella immunity or laboratory confirmation of disease should consider measles and mumps immunization with 2 doses of MMR vaccine or rubella immunization with 1 dose of MMR vaccine.  Pneumococcal 13-valent conjugate (PCV13) vaccine. When indicated, a person who is uncertain of his immunization history and has no record of immunization should receive the  PCV13 vaccine. All adults 53 years of age and older should receive this vaccine. An adult aged 37 years or older who has certain medical conditions and has not been previously immunized should receive 1 dose of PCV13 vaccine. This PCV13 should be followed with a dose of pneumococcal polysaccharide (PPSV23) vaccine. Adults who are at high risk for pneumococcal disease should obtain the PPSV23 vaccine at least 8 weeks after the dose of PCV13 vaccine. Adults older than 66 years of age who have normal immune system function should obtain the PPSV23 vaccine dose at least 1 year after the dose of PCV13 vaccine.  Pneumococcal polysaccharide (PPSV23) vaccine. When PCV13 is also indicated, PCV13 should be obtained first. All adults aged 54 years and older should be immunized. An adult younger than age 65 years who has certain medical conditions should be immunized. Any person who resides in a nursing home or long-term care facility should be immunized. An adult smoker should be immunized. People with an immunocompromised condition and certain other conditions should receive both PCV13 and PPSV23 vaccines. People with human immunodeficiency virus (HIV) infection should be immunized as soon as possible after diagnosis. Immunization during chemotherapy or radiation therapy should be avoided. Routine use of PPSV23 vaccine is not recommended for American Indians, [place] Natives, or people younger than 65 years unless there are medical conditions that require PPSV23 vaccine. When indicated, people who have unknown immunization and have no record of immunization should receive PPSV23 vaccine. One-time revaccination 5 years after the first dose of PPSV23 is recommended for people aged 19-64 years who have chronic kidney failure, nephrotic syndrome, asplenia, or immunocompromised conditions. People who received 1-2 doses of PPSV23 before age 40 years should receive another dose of PPSV23 vaccine at age 64 years or later if at least  5 years have passed since the previous dose. Doses of PPSV23 are not needed for people immunized with PPSV23 at or after age 102 years.  Meningococcal vaccine. Adults with asplenia or persistent complement component deficiencies should receive 2 doses of quadrivalent meningococcal  conjugate (MenACWY-D) vaccine. The doses should be obtained at least 2 months apart. Microbiologists working with certain meningococcal bacteria, [employer] recruits, people at risk during an outbreak, and people who travel to or live in countries with a high rate of meningitis should be immunized. A first-year college student up through age 65 years who is living in a residence hall should receive a dose if she did not receive a dose on or after her 16th birthday. Adults who have certain high-risk conditions should receive one or more doses of vaccine.  Hepatitis A vaccine. Adults who wish to be protected from this disease, have certain high-risk conditions, work with hepatitis A-infected animals, work in hepatitis A research labs, or travel to or work in countries with a high rate of hepatitis A should be immunized. Adults who were previously unvaccinated and who anticipate close contact with an international adoptee during the first 60 days after arrival in the [place] from a country with a high rate of hepatitis A should be immunized.  Hepatitis B vaccine. Adults who wish to be protected from this disease, have certain high-risk conditions, may be exposed to blood or other infectious body fluids, are household contacts or sex partners of hepatitis B positive people, are clients or workers in certain care facilities, or travel to or work in countries with a high rate of hepatitis B should be immunized.  Haemophilus influenzae type b (Hib) vaccine. A previously unvaccinated person with asplenia or sickle cell disease or having a scheduled splenectomy should receive 1 dose of Hib vaccine. Regardless of previous immunization, a  recipient of a hematopoietic stem cell transplant should receive a 3-dose series 6-12 months after her successful transplant. Hib vaccine is not recommended for adults with HIV infection. Preventive Services / Frequency Ages 85 to 67 years  Blood pressure check.** / Every 3-5 years.  Lipid and cholesterol check.** / Every 5 years beginning at age 77.  Clinical breast exam.** / Every 3 years for women in their 38s and 44s.  BRCA-related cancer risk assessment.** / For women who have family members with a BRCA-related cancer (breast, ovarian, tubal, or peritoneal cancers).  Pap test.** / Every 2 years from ages 60 through 5. Every 3 years starting at age 37 through age 53 or 71 with a history of 3 consecutive normal Pap tests.  HPV screening.** / Every 3 years from ages 66 through ages 77 to 38 with a history of 3 consecutive normal Pap tests.  Hepatitis C blood test.** / For any individual with known risks for hepatitis C.  Skin self-exam. / Monthly.  Influenza vaccine. / Every year.  Tetanus, diphtheria, and acellular pertussis (Tdap, Td) vaccine.** / Consult your health care provider. Pregnant women should receive 1 dose of Tdap vaccine during each pregnancy. 1 dose of Td every 10 years.  Varicella vaccine.** / Consult your health care provider. Pregnant females who do not have evidence of immunity should receive the first dose after pregnancy.  HPV vaccine. / 3 doses over 6 months, if 75 and younger. The vaccine is not recommended for use in pregnant females. However, pregnancy testing is not needed before receiving a dose.  Measles, mumps, rubella (MMR) vaccine.** / You need at least 1 dose of MMR if you were born in 1957 or later. You may also need a 2nd dose. For females of childbearing age, rubella immunity should be determined. If there is no evidence of immunity, females who are not pregnant should be vaccinated. If there  is no evidence of immunity, females who are pregnant  should delay immunization until after pregnancy.  Pneumococcal 13-valent conjugate (PCV13) vaccine.** / Consult your health care provider.  Pneumococcal polysaccharide (PPSV23) vaccine.** / 1 to 2 doses if you smoke cigarettes or if you have certain conditions.  Meningococcal vaccine.** / 1 dose if you are age 42 to 34 years and a Market researcher living in a residence hall, or have one of several medical conditions, you need to get vaccinated against meningococcal disease. You may also need additional booster doses.  Hepatitis A vaccine.** / Consult your health care provider.  Hepatitis B vaccine.** / Consult your health care provider.  Haemophilus influenzae type b (Hib) vaccine.** / Consult your health care provider. Ages 59 to 89 years  Blood pressure check.** / Every year.  Lipid and cholesterol check.** / Every 5 years beginning at age 45 years.  Lung cancer screening. / Every year if you are aged 4-80 years and have a 30-pack-year history of smoking and currently smoke or have quit within the past 15 years. Yearly screening is stopped once you have quit smoking for at least 15 years or develop a health problem that would prevent you from having lung cancer treatment.  Clinical breast exam.** / Every year after age 25 years.  BRCA-related cancer risk assessment.** / For women who have family members with a BRCA-related cancer (breast, ovarian, tubal, or peritoneal cancers).  Mammogram.** / Every year beginning at age 58 years and continuing for as long as you are in good health. Consult with your health care provider.  Pap test.** / Every 3 years starting at age 51 years through age 33 or 56 years with a history of 3 consecutive normal Pap tests.  HPV screening.** / Every 3 years from ages 36 years through ages 34 to 70 years with a history of 3 consecutive normal Pap tests.  Fecal occult blood test (FOBT) of stool. / Every year beginning at age 64 years and  continuing until age 57 years. You may not need to do this test if you get a colonoscopy every 10 years.  Flexible sigmoidoscopy or colonoscopy.** / Every 5 years for a flexible sigmoidoscopy or every 10 years for a colonoscopy beginning at age 21 years and continuing until age 31 years.  Hepatitis C blood test.** / For all people born from [birth year] through 1965 and any individual with known risks for hepatitis C.  Skin self-exam. / Monthly.  Influenza vaccine. / Every year.  Tetanus, diphtheria, and acellular pertussis (Tdap/Td) vaccine.** / Consult your health care provider. Pregnant women should receive 1 dose of Tdap vaccine during each pregnancy. 1 dose of Td every 10 years.  Varicella vaccine.** / Consult your health care provider. Pregnant females who do not have evidence of immunity should receive the first dose after pregnancy.  Zoster vaccine.** / 1 dose for adults aged 40 years or older.  Measles, mumps, rubella (MMR) vaccine.** / You need at least 1 dose of MMR if you were born in 1957 or later. You may also need a second dose. For females of childbearing age, rubella immunity should be determined. If there is no evidence of immunity, females who are not pregnant should be vaccinated. If there is no evidence of immunity, females who are pregnant should delay immunization until after pregnancy.  Pneumococcal 13-valent conjugate (PCV13) vaccine.** / Consult your health care provider.  Pneumococcal polysaccharide (PPSV23) vaccine.** / 1 to 2 doses if you smoke cigarettes or if  you have certain conditions.  Meningococcal vaccine.** / Consult your health care provider.  Hepatitis A vaccine.** / Consult your health care provider.  Hepatitis B vaccine.** / Consult your health care provider.  Haemophilus influenzae type b (Hib) vaccine.** / Consult your health care provider. Ages 65 years and over  Blood pressure check.** / Every year.  Lipid and cholesterol check.** / Every 5 years  beginning at age 20 years.  Lung cancer screening. / Every year if you are aged 55-80 years and have a 30-pack-year history of smoking and currently smoke or have quit within the past 15 years. Yearly screening is stopped once you have quit smoking for at least 15 years or develop a health problem that would prevent you from having lung cancer treatment.  Clinical breast exam.** / Every year after age 40 years.  BRCA-related cancer risk assessment.** / For women who have family members with a BRCA-related cancer (breast, ovarian, tubal, or peritoneal cancers).  Mammogram.** / Every year beginning at age 40 years and continuing for as long as you are in good health. Consult with your health care provider.  Pap test.** / Every 3 years starting at age 30 years through age 65 or 70 years with 3 consecutive normal Pap tests. Testing can be stopped between 65 and 70 years with 3 consecutive normal Pap tests and no abnormal Pap or HPV tests in the past 10 years.  HPV screening.** / Every 3 years from ages 30 years through ages 65 or 70 years with a history of 3 consecutive normal Pap tests. Testing can be stopped between 65 and 70 years with 3 consecutive normal Pap tests and no abnormal Pap or HPV tests in the past 10 years.  Fecal occult blood test (FOBT) of stool. / Every year beginning at age 50 years and continuing until age 75 years. You may not need to do this test if you get a colonoscopy every 10 years.  Flexible sigmoidoscopy or colonoscopy.** / Every 5 years for a flexible sigmoidoscopy or every 10 years for a colonoscopy beginning at age 50 years and continuing until age 75 years.  Hepatitis C blood test.** / For all people born from 1945 through 1965 and any individual with known risks for hepatitis C.  Osteoporosis screening.** / A one-time screening for women ages 65 years and over and women at risk for fractures or osteoporosis.  Skin self-exam. / Monthly.  Influenza vaccine. /  Every year.  Tetanus, diphtheria, and acellular pertussis (Tdap/Td) vaccine.** / 1 dose of Td every 10 years.  Varicella vaccine.** / Consult your health care provider.  Zoster vaccine.** / 1 dose for adults aged 60 years or older.  Pneumococcal 13-valent conjugate (PCV13) vaccine.** / Consult your health care provider.  Pneumococcal polysaccharide (PPSV23) vaccine.** / 1 dose for all adults aged 65 years and older.  Meningococcal vaccine.** / Consult your health care provider.  Hepatitis A vaccine.** / Consult your health care provider.  Hepatitis B vaccine.** / Consult your health care provider.  Haemophilus influenzae type b (Hib) vaccine.** / Consult your health care provider. ** Family history and personal history of risk and conditions may change your health care provider's recommendations.   This information is not intended to replace advice given to you by your health care provider. Make sure you discuss any questions you have with your health care provider.   Document Released: 12/09/2001 Document Revised: 11/03/2014 Document Reviewed: 03/10/2011 Elsevier Interactive Patient Education 2016 Elsevier Inc.  

## 2016-01-15 NOTE — Progress Notes (Signed)
Subjective:    Patient ID: Tricia Fleming, female    DOB: 1951-03-19, 65 y.o.   MRN: [id number]  Chief Complaint  Patient presents with  . Annual Exam    HPI Patient is in today for Annual Physical Exam.  She feels well today. She deneis any recent illness or acute concerns. Is still working full time. Stopped Sertraline because she found it not helpful. She is managing well with her current meds and denies any suicidal ideation. She does endorse some anhedonia but finds it manageable. She stays active and denies any difficulties with ADLs. Denies CP/palp/SOB/HA/congestion/fevers/GI or GU c/o. Taking meds as prescribed.   Past Medical History  Diagnosis Date  . Hyperlipidemia   . Anxiety   . Preventative health care 06/27/2011  . Depression with anxiety 06/27/2011  . Depression   . Breast lesion 06/18/2010    Qualifier: Diagnosis of  By: Charlett Blake MD, Erline Levine    . Shoulder pain, bilateral 02/13/2013  . Hyperglycemia 07/27/2013  . SCC (squamous cell carcinoma), leg 05/04/2014    SCC   . Preventative health care 06/27/2011  . Osteopenia 01/15/2016    Past Surgical History  Procedure Laterality Date  . Tubal ligation      Family History  Problem Relation Age of Onset  . Cancer Father     metastic prostate  . Diabetes Mother   . Hypertension Mother   . Coronary artery disease Mother   . Transient ischemic attack Sister   . Heart disease Brother   . Benign prostatic hyperplasia Brother   . Lung disease Brother     in a smoker  . Insulin resistance Brother   . Hypertension Sister   . Benign prostatic hyperplasia Brother   . Alcohol abuse Brother   . Arthritis Sister   . Esophageal varices Brother   . Cirrhosis Brother   . Diabetes Maternal Grandmother   . Coronary artery disease Maternal Grandmother   . Diabetes Maternal Grandfather   . Cancer Maternal Aunt   . Birth defects Paternal Aunt     breast    Social History   Social History  . Marital Status: Married   Spouse Name: N/A  . Number of Children: N/A  . Years of Education: N/A   Occupational History  . Not on file.   Social History Main Topics  . Smoking status: Never Smoker   . Smokeless tobacco: Never Used  . Alcohol Use: 0.0 oz/week    0 drink(s) per week     Comment: a couple beers a week  . Drug Use: No  . Sexual Activity:    Partners: Male   Other Topics Concern  . Not on file   Social History Narrative    Outpatient Prescriptions Prior to Visit  Medication Sig Dispense Refill  . fish oil-omega-3 fatty acids 1000 MG capsule Take 1,000 mg by mouth daily. 2 caps     . lovastatin (MEVACOR) 40 MG tablet Take 1 tablet (40 mg total) by mouth at bedtime. 90 tablet 2  . ALPRAZolam (XANAX) 1 MG tablet TAKE 1 TABLET BY MOUTH TWICE A DAY AS NEEDED FOR SLEEP AND ANXIETY 60 tablet 1  . zolpidem (AMBIEN) 10 MG tablet TAKE 1 TABLET BY MOUTH AT BEDTIME AS NEEDED FOR SLEEP 30 tablet 1  . sertraline (ZOLOFT) 50 MG tablet TAKE 1 TABLET (50 MG TOTAL) BY MOUTH DAILY. 30 tablet 0   No facility-administered medications prior to visit.    No Known Allergies  Review of Systems  Constitutional: Negative for fever and malaise/fatigue.  HENT: Negative for congestion.   Eyes: Negative for blurred vision.  Respiratory: Negative for shortness of breath.   Cardiovascular: Negative for chest pain, palpitations and leg swelling.  Gastrointestinal: Negative for nausea, abdominal pain and blood in stool.  Genitourinary: Negative for dysuria and frequency.  Musculoskeletal: Negative for falls.  Skin: Negative for rash.  Neurological: Negative for dizziness, loss of consciousness and headaches.  Endo/Heme/Allergies: Negative for environmental allergies.  Psychiatric/Behavioral: Negative for depression. The patient is not nervous/anxious.        Objective:    Physical Exam  Constitutional: She is oriented to person, place, and time. She appears well-developed and well-nourished. No distress.    HENT:  Head: Normocephalic and atraumatic.  Eyes: Conjunctivae are normal.  Neck: Neck supple. No thyromegaly present.  Cardiovascular: Normal rate, regular rhythm and normal heart sounds.   No murmur heard. Pulmonary/Chest: Effort normal and breath sounds normal. No respiratory distress.  Abdominal: Soft. Bowel sounds are normal. She exhibits no distension and no mass. There is no tenderness.  Musculoskeletal: She exhibits no edema.  Lymphadenopathy:    She has no cervical adenopathy.  Neurological: She is alert and oriented to person, place, and time.  Skin: Skin is warm and dry.  Psychiatric: She has a normal mood and affect. Her behavior is normal.    BP 112/62 mmHg  Pulse 77  Temp(Src) 98.1 F (36.7 C) (Oral)  Ht 5\' 2"  (1.575 m)  Wt 148 lb 4 oz (67.246 kg)  BMI 27.11 kg/m2  SpO2 97% Wt Readings from Last 3 Encounters:  01/15/16 148 lb 4 oz (67.246 kg)  07/06/15 153 lb 4 oz (69.514 kg)  11/27/14 156 lb (70.761 kg)     Lab Results  Component Value Date   WBC 4.1 01/15/2016   HGB 13.9 01/15/2016   HCT 40.7 01/15/2016   PLT 198.0 01/15/2016   GLUCOSE 113* 01/15/2016   CHOL 203* 01/15/2016   TRIG 169.0* 01/15/2016   HDL 49.90 01/15/2016   LDLDIRECT 103.0 11/27/2014   LDLCALC 119* 01/15/2016   ALT 22 01/15/2016   AST 18 01/15/2016   NA 141 01/15/2016   K 4.3 01/15/2016   CL 103 01/15/2016   CREATININE 0.86 01/15/2016   BUN 24* 01/15/2016   CO2 32 01/15/2016   TSH 1.43 01/15/2016   HGBA1C 5.9 01/15/2016   MICROALBUR <0.7 07/06/2015    Lab Results  Component Value Date   TSH 1.43 01/15/2016   Lab Results  Component Value Date   WBC 4.1 01/15/2016   HGB 13.9 01/15/2016   HCT 40.7 01/15/2016   MCV 84.1 01/15/2016   PLT 198.0 01/15/2016   Lab Results  Component Value Date   NA 141 01/15/2016   K 4.3 01/15/2016   CO2 32 01/15/2016   GLUCOSE 113* 01/15/2016   BUN 24* 01/15/2016   CREATININE 0.86 01/15/2016   BILITOT 0.3 01/15/2016   ALKPHOS 49  01/15/2016   AST 18 01/15/2016   ALT 22 01/15/2016   PROT 6.9 01/15/2016   ALBUMIN 4.5 01/15/2016   CALCIUM 9.7 01/15/2016   GFR 70.24 01/15/2016   Lab Results  Component Value Date   CHOL 203* 01/15/2016   Lab Results  Component Value Date   HDL 49.90 01/15/2016   Lab Results  Component Value Date   LDLCALC 119* 01/15/2016   Lab Results  Component Value Date   TRIG 169.0* 01/15/2016   Lab Results  Component  Value Date   CHOLHDL 4 01/15/2016   Lab Results  Component Value Date   HGBA1C 5.9 01/15/2016       Assessment & Plan:   Problem List Items Addressed This Visit    ELEVATED BP READING WITHOUT DX HYPERTENSION - Primary    Well controlled. Encouraged heart healthy diet such as the DASH diet and exercise as tolerated.      Relevant Medications   ALPRAZolam (XANAX) 1 MG tablet   Other Relevant Orders   Lipid panel (Completed)   TSH (Completed)   Hemoglobin A1c (Completed)   CBC (Completed)   Comprehensive metabolic panel (Completed)   Varicella-zoster vaccine subcutaneous (Completed)   VITAMIN D 25 Hydroxy (Vit-D Deficiency, Fractures) (Completed)   Hyperglycemia    hgba1c acceptable, minimize simple carbs. Increase exercise as tolerated.       Relevant Medications   ALPRAZolam (XANAX) 1 MG tablet   Other Relevant Orders   Lipid panel (Completed)   TSH (Completed)   Hemoglobin A1c (Completed)   CBC (Completed)   Comprehensive metabolic panel (Completed)   Varicella-zoster vaccine subcutaneous (Completed)   VITAMIN D 25 Hydroxy (Vit-D Deficiency, Fractures) (Completed)   Insomnia    Encouraged good sleep hygiene such as dark, quiet room. No blue/green glowing lights such as computer screens in bedroom. No alcohol or stimulants in evening. Cut down on caffeine as able. Regular exercise is helpful but not just prior to bed time. Allowed refill on Ambien      Osteopenia    Encouraged to get adequate exercise, calcium and vitamin d intake. Check a  vitamin D level today Caltrate+ bid and Vitamin D 5000 qd      Relevant Medications   ALPRAZolam (XANAX) 1 MG tablet   Other Relevant Orders   Lipid panel (Completed)   TSH (Completed)   Hemoglobin A1c (Completed)   CBC (Completed)   Comprehensive metabolic panel (Completed)   Varicella-zoster vaccine subcutaneous (Completed)   VITAMIN D 25 Hydroxy (Vit-D Deficiency, Fractures) (Completed)   Preventative health care    Patient encouraged to maintain heart healthy diet, regular exercise, adequate sleep. Consider daily probiotics. Take medications as prescribed. Given and reviewed copy of ACP documents from [facility] Secretary of State and encouraged to complete and return       Other Visit Diagnoses    Hyperlipidemia, mixed        Relevant Medications    ALPRAZolam (XANAX) 1 MG tablet    Other Relevant Orders    Lipid panel (Completed)    TSH (Completed)    Hemoglobin A1c (Completed)    CBC (Completed)    Comprehensive metabolic panel (Completed)    Varicella-zoster vaccine subcutaneous (Completed)    VITAMIN D 25 Hydroxy (Vit-D Deficiency, Fractures) (Completed)    Need for shingles vaccine        Relevant Medications    ALPRAZolam (XANAX) 1 MG tablet    Other Relevant Orders    Lipid panel (Completed)    TSH (Completed)    Hemoglobin A1c (Completed)    CBC (Completed)    Comprehensive metabolic panel (Completed)    Varicella-zoster vaccine subcutaneous (Completed)    VITAMIN D 25 Hydroxy (Vit-D Deficiency, Fractures) (Completed)       I have discontinued Ms. Crispen's sertraline. I am also having her maintain her fish oil-omega-3 fatty acids, lovastatin, Biotin, zolpidem, and ALPRAZolam.  Meds ordered this encounter  Medications  . DISCONTD: ALPRAZolam (XANAX) 1 MG tablet    Sig: TAKE 1 TABLET BY  MOUTH TWICE A DAY AS NEEDED FOR SLEEP AND ANXIETY    Dispense:  60 tablet    Refill:  1  . Biotin 5000 MCG CAPS    Sig: Take by mouth.  . DISCONTD: zolpidem (AMBIEN) 10 MG  tablet    Sig: Take 1 tablet (10 mg total) by mouth at bedtime as needed. for sleep    Dispense:  30 tablet    Refill:  1  . zolpidem (AMBIEN) 10 MG tablet    Sig: Take 1 tablet (10 mg total) by mouth at bedtime as needed. for sleep    Dispense:  90 tablet    Refill:  1  . ALPRAZolam (XANAX) 1 MG tablet    Sig: TAKE 1 TABLET BY MOUTH TWICE A DAY AS NEEDED FOR SLEEP AND ANXIETY    Dispense:  180 tablet    Refill:  1     Penni Homans, MD

## 2016-01-15 NOTE — Assessment & Plan Note (Signed)
Patient encouraged to maintain heart healthy diet, regular exercise, adequate sleep. Consider daily probiotics. Take medications as prescribed. Given and reviewed copy of ACP documents from [place] Secretary of State and encouraged to complete and return 

## 2016-01-15 NOTE — Assessment & Plan Note (Signed)
Encouraged heart healthy diet, increase exercise, avoid trans fats, consider a krill oil cap daily 

## 2016-02-12 ENCOUNTER — Telehealth: Payer: Self-pay | Admitting: Family Medicine

## 2016-02-12 NOTE — Telephone Encounter (Signed)
Faxed form again to number on form. Did receive confirmation fax received. Mailed a copy to the patients home.  Called the patient to inform that form had been faxed already at her OV, but will certainly do again and also mailed a copy to her home.

## 2016-02-12 NOTE — Telephone Encounter (Signed)
This form did not come across paperwork file/box; see copy in media/SLS 04/18

## 2016-02-12 NOTE — Telephone Encounter (Signed)
Called the patient left message to call back 

## 2016-02-12 NOTE — Telephone Encounter (Signed)
I did not see this form.  Advise

## 2016-02-12 NOTE — Telephone Encounter (Signed)
Pt called in to check the status of her biometric screening form. She says that she was to have a copy faxed to her insurance company. Pt says that the insurance fax # is on the form.    CB: [phone] for confirmation.

## 2016-02-12 NOTE — Telephone Encounter (Signed)
Notify do not have the form at this time. Happy to complete it quickly if she can get [place] a copy. Sorry.

## 2016-02-12 NOTE — Telephone Encounter (Signed)
Called the patient left msg. To call back. Form was faxed already on her appointment date 01/15/2016. Have a copy that was in media/scanned already.

## 2016-02-18 NOTE — Telephone Encounter (Signed)
Reprinted from from [place].  Form had been signed  By PCP. Called the patient left message to call back.

## 2016-02-18 NOTE — Telephone Encounter (Signed)
Patient called to inform that the form was faxed but had not been signed by Dr. Randel Pigg. Ask that the form be signed and faxed again.

## 2016-02-19 NOTE — Telephone Encounter (Signed)
Pt called in because she says that her forms were missing the date. Pt would like to know if PCP or CMA could provide the date on the form faxed.

## 2016-02-19 NOTE — Telephone Encounter (Signed)
I did inform our copy from scan clearly has PCP's signature on it and that on 02/12/16 a copy was mailed to her home to confirm once received signature of PCP.  Patient while on the phone is requesting PCP put her back on wellbutrin.  She states she has tried in the past, but must not have stayed on long enough.

## 2016-02-19 NOTE — Telephone Encounter (Signed)
Have dated form and faxed again.

## 2016-04-21 ENCOUNTER — Other Ambulatory Visit: Payer: Self-pay | Admitting: Family Medicine

## 2016-07-25 ENCOUNTER — Other Ambulatory Visit: Payer: Self-pay | Admitting: Family Medicine

## 2016-07-25 DIAGNOSIS — Z23 Encounter for immunization: Secondary | ICD-10-CM

## 2016-07-25 DIAGNOSIS — R739 Hyperglycemia, unspecified: Secondary | ICD-10-CM

## 2016-07-25 DIAGNOSIS — M8580 Other specified disorders of bone density and structure, unspecified site: Secondary | ICD-10-CM

## 2016-07-25 DIAGNOSIS — E782 Mixed hyperlipidemia: Secondary | ICD-10-CM

## 2016-07-25 DIAGNOSIS — R030 Elevated blood-pressure reading, without diagnosis of hypertension: Secondary | ICD-10-CM

## 2016-07-25 NOTE — Telephone Encounter (Signed)
Faxed alprazolam and zolpidem to her pharmacy.

## 2016-07-25 NOTE — Telephone Encounter (Signed)
Requesting:  Alprazolam and Zolpidem Contract  Signed on 07/24/2014 UDS  Low risk, overdue Last OV   01/15/2016 Last Refill   Alprazolam  #180 with 0 refills on 01/15/2016                    Zolpidem   #90 with 1 refill on 01/15/2016  Please Advise

## 2016-08-18 ENCOUNTER — Other Ambulatory Visit: Payer: Self-pay | Admitting: Family Medicine

## 2016-08-18 DIAGNOSIS — Z1231 Encounter for screening mammogram for malignant neoplasm of breast: Secondary | ICD-10-CM

## 2016-10-16 ENCOUNTER — Ambulatory Visit (HOSPITAL_BASED_OUTPATIENT_CLINIC_OR_DEPARTMENT_OTHER): Payer: BLUE CROSS/BLUE SHIELD

## 2016-12-08 ENCOUNTER — Ambulatory Visit (INDEPENDENT_AMBULATORY_CARE_PROVIDER_SITE_OTHER): Payer: BLUE CROSS/BLUE SHIELD | Admitting: Family Medicine

## 2016-12-08 ENCOUNTER — Encounter: Payer: Self-pay | Admitting: Family Medicine

## 2016-12-08 VITALS — BP 108/76 | HR 91 | Temp 98.7°F | Wt 139.8 lb

## 2016-12-08 DIAGNOSIS — R1013 Epigastric pain: Secondary | ICD-10-CM | POA: Diagnosis not present

## 2016-12-08 DIAGNOSIS — K219 Gastro-esophageal reflux disease without esophagitis: Secondary | ICD-10-CM

## 2016-12-08 DIAGNOSIS — Z0000 Encounter for general adult medical examination without abnormal findings: Secondary | ICD-10-CM

## 2016-12-08 DIAGNOSIS — R739 Hyperglycemia, unspecified: Secondary | ICD-10-CM

## 2016-12-08 DIAGNOSIS — R030 Elevated blood-pressure reading, without diagnosis of hypertension: Secondary | ICD-10-CM

## 2016-12-08 DIAGNOSIS — R0789 Other chest pain: Secondary | ICD-10-CM

## 2016-12-08 DIAGNOSIS — E782 Mixed hyperlipidemia: Secondary | ICD-10-CM | POA: Diagnosis not present

## 2016-12-08 HISTORY — DX: Other chest pain: R07.89

## 2016-12-08 MED ORDER — RANITIDINE HCL 300 MG PO TABS: ORAL_TABLET | ORAL | 1 refills | Status: DC

## 2016-12-08 NOTE — Assessment & Plan Note (Signed)
hgba1c acceptable,inimize simple carbs. Increase exercise as tolerated. Continue current meds

## 2016-12-08 NOTE — Assessment & Plan Note (Addendum)
Likely GI but did get EKG which was WNL. Seek care if symptoms worsen.

## 2016-12-08 NOTE — Assessment & Plan Note (Addendum)
Recent flare with symptoms, noting epigastrium discomfort and bloating and some pressure. Check an H pylori, avoid offending foods. Start ranitidine, probiotics and use Mylanta prn. Report if symptoms worsen

## 2016-12-08 NOTE — Assessment & Plan Note (Signed)
Minimize caffeine and sodium.

## 2016-12-08 NOTE — Assessment & Plan Note (Signed)
>>  ASSESSMENT AND PLAN FOR MIXED DIABETIC HYPERLIPIDEMIA ASSOCIATED WITH TYPE 2 DIABETES MELLITUS (HCC) WRITTEN ON 12/08/2016  3:46 PM BY BLYTH, STACEY A, MD  hgba1c acceptable,inimize simple carbs. Increase exercise as tolerated. Continue current meds

## 2016-12-08 NOTE — Progress Notes (Signed)
Pre visit review using our clinic review tool, if applicable. No additional management support is needed unless otherwise documented below in the visit note. 

## 2016-12-08 NOTE — Patient Instructions (Addendum)
Probiotic Zantac  Mylanta as needed   Gastroesophageal Reflux Disease, Adult Normally, food travels down the esophagus and stays in the stomach to be digested. However, when a person has gastroesophageal reflux disease (GERD), food and stomach acid move back up into the esophagus. When this happens, the esophagus becomes sore and inflamed. Over time, GERD can create small holes (ulcers) in the lining of the esophagus. What are the causes? This condition is caused by a problem with the muscle between the esophagus and the stomach (lower esophageal sphincter, or LES). Normally, the LES muscle closes after food passes through the esophagus to the stomach. When the LES is weakened or abnormal, it does not close properly, and that allows food and stomach acid to go back up into the esophagus. The LES can be weakened by certain dietary substances, medicines, and medical conditions, including:  Tobacco use.  Pregnancy.  Having a hiatal hernia.  Heavy alcohol use.  Certain foods and beverages, such as coffee, chocolate, onions, and peppermint. What increases the risk? This condition is more likely to develop in:  People who have an increased body weight.  People who have connective tissue disorders.  People who use NSAID medicines. What are the signs or symptoms? Symptoms of this condition include:  Heartburn.  Difficult or painful swallowing.  The feeling of having a lump in the throat.  Abitter taste in the mouth.  Bad breath.  Having a large amount of saliva.  Having an upset or bloated stomach.  Belching.  Chest pain.  Shortness of breath or wheezing.  Ongoing (chronic) cough or a night-time cough.  Wearing away of tooth enamel.  Weight loss. Different conditions can cause chest pain. Make sure to see your health care provider if you experience chest pain. How is this diagnosed? Your health care provider will take a medical history and perform a physical exam. To  determine if you have mild or severe GERD, your health care provider may also monitor how you respond to treatment. You may also have other tests, including:  An endoscopy toexamine your stomach and esophagus with a small camera.  A test thatmeasures the acidity level in your esophagus.  A test thatmeasures how much pressure is on your esophagus.  A barium swallow or modified barium swallow to show the shape, size, and functioning of your esophagus. How is this treated? The goal of treatment is to help relieve your symptoms and to prevent complications. Treatment for this condition may vary depending on how severe your symptoms are. Your health care provider may recommend:  Changes to your diet.  Medicine.  Surgery. Follow these instructions at home: Diet  Follow a diet as recommended by your health care provider. This may involve avoiding foods and drinks such as:  Coffee and tea (with or without caffeine).  Drinks that containalcohol.  Energy drinks and sports drinks.  Carbonated drinks or sodas.  Chocolate and cocoa.  Peppermint and mint flavorings.  Garlic and onions.  Horseradish.  Spicy and acidic foods, including peppers, chili powder, curry powder, vinegar, hot sauces, and barbecue sauce.  Citrus fruit juices and citrus fruits, such as oranges, lemons, and limes.  Tomato-based foods, such as red sauce, chili, salsa, and pizza with red sauce.  Fried and fatty foods, such as donuts, french fries, potato chips, and high-fat dressings.  High-fat meats, such as hot dogs and fatty cuts of red and white meats, such as rib eye steak, sausage, ham, and bacon.  High-fat dairy items, such  as whole milk, butter, and cream cheese.  Eat small, frequent meals instead of large meals.  Avoid drinking large amounts of liquid with your meals.  Avoid eating meals during the 2-3 hours before bedtime.  Avoid lying down right after you eat.  Do not exercise right after  you eat. General instructions  Pay attention to any changes in your symptoms.  Take over-the-counter and prescription medicines only as told by your health care provider. Do not take aspirin, ibuprofen, or other NSAIDs unless your health care provider told you to do so.  Do not use any tobacco products, including cigarettes, chewing tobacco, and e-cigarettes. If you need help quitting, ask your health care provider.  Wear loose-fitting clothing. Do not wear anything tight around your waist that causes pressure on your abdomen.  Raise (elevate) the head of your bed 6 inches (15cm).  Try to reduce your stress, such as with yoga or meditation. If you need help reducing stress, ask your health care provider.  If you are overweight, reduce your weight to an amount that is healthy for you. Ask your health care provider for guidance about a safe weight loss goal.  Keep all follow-up visits as told by your health care provider. This is important. Contact a health care provider if:  You have new symptoms.  You have unexplained weight loss.  You have difficulty swallowing, or it hurts to swallow.  You have wheezing or a persistent cough.  Your symptoms do not improve with treatment.  You have a hoarse voice. Get help right away if:  You have pain in your arms, neck, jaw, teeth, or back.  You feel sweaty, dizzy, or light-headed.  You have chest pain or shortness of breath.  You vomit and your vomit looks like blood or coffee grounds.  You faint.  Your stool is bloody or black.  You cannot swallow, drink, or eat. This information is not intended to replace advice given to you by your health care provider. Make sure you discuss any questions you have with your health care provider. Document Released: 07/23/2005 Document Revised: 03/12/2016 Document Reviewed: 02/07/2015 Elsevier Interactive Patient Education  2017 [place] for Gastroesophageal Reflux Disease,  Adult When you have gastroesophageal reflux disease (GERD), the foods you eat and your eating habits are very important. Choosing the right foods can help ease your discomfort. What guidelines do I need to follow?  Choose fruits, vegetables, whole grains, and low-fat dairy products.  Choose low-fat meat, fish, and poultry.  Limit fats such as oils, salad dressings, butter, nuts, and avocado.  Keep a food diary. This helps you identify foods that cause symptoms.  Avoid foods that cause symptoms. These may be different for everyone.  Eat small meals often instead of 3 large meals a day.  Eat your meals slowly, in a place where you are relaxed.  Limit fried foods.  Cook foods using methods other than frying.  Avoid drinking alcohol.  Avoid drinking large amounts of liquids with your meals.  Avoid bending over or lying down until 2-3 hours after eating. What foods are not recommended? These are some foods and drinks that may make your symptoms worse: Vegetables  Tomatoes. Tomato juice. Tomato and spaghetti sauce. Chili peppers. Onion and garlic. Horseradish. Fruits  Oranges, grapefruit, and lemon (fruit and juice). Meats  High-fat meats, fish, and poultry. This includes hot dogs, ribs, ham, sausage, salami, and bacon. Dairy  Whole milk and chocolate milk. Sour cream. Cream. Butter.  Ice cream. Cream cheese. Drinks  Coffee and tea. Bubbly (carbonated) drinks or energy drinks. Condiments  Hot sauce. Barbecue sauce. Sweets/Desserts  Chocolate and cocoa. Donuts. Peppermint and spearmint. Fats and Oils  High-fat foods. This includes [place] fries and potato chips. Other  Vinegar. Strong spices. This includes black pepper, white pepper, red pepper, cayenne, curry powder, cloves, ginger, and chili powder. The items listed above may not be a complete list of foods and drinks to avoid. Contact your dietitian for more information.  This information is not intended to replace advice  given to you by your health care provider. Make sure you discuss any questions you have with your health care provider. Document Released: 04/13/2012 Document Revised: 03/20/2016 Document Reviewed: 08/17/2013 Elsevier Interactive Patient Education  2017 [employer].

## 2016-12-08 NOTE — Progress Notes (Signed)
Patient ID: Tricia Fleming, female   DOB: Apr 08, 1951, 66 y.o.   MRN: [id number]   Subjective:    Patient ID: Tricia Fleming, female    DOB: 08-01-1951, 66 y.o.   MRN: [id number]  Chief Complaint  Patient presents with  . Gastroesophageal Reflux    Gastroesophageal Reflux  She complains of abdominal pain, heartburn and nausea. She reports no chest pain. This is a new problem. The current episode started in the past 7 days. Pertinent negatives include no melena.    Patient is in today for an acute flare in heartburn symptoms. She denies any fevers, chills or any one else around her having similar symptoms. She does note the episodes of dyspepsia do correlate with eating poorly or too late. She notes some epigastric pain associated with her dyspepsia at times. Denies CP/palp/SOB/HA/congestion/fevers or GU c/o. Taking meds as prescribed  Past Medical History:  Diagnosis Date  . Anxiety   . Atypical chest pain 12/08/2016  . Breast lesion 06/18/2010   Qualifier: Diagnosis of  By: Charlett Blake MD, Erline Levine    . Depression   . Depression with anxiety 06/27/2011  . Hyperglycemia 07/27/2013  . Hyperlipidemia   . Osteopenia 01/15/2016  . Preventative health care 06/27/2011  . Preventative health care 06/27/2011  . SCC (squamous cell carcinoma), leg 05/04/2014   SCC   . Shoulder pain, bilateral 02/13/2013    Past Surgical History:  Procedure Laterality Date  . TUBAL LIGATION      Family History  Problem Relation Age of Onset  . Cancer Father     metastic prostate  . Diabetes Mother   . Hypertension Mother   . Coronary artery disease Mother   . Transient ischemic attack Sister   . Heart disease Brother   . Benign prostatic hyperplasia Brother   . Lung disease Brother     in a smoker  . Insulin resistance Brother   . Hypertension Sister   . Benign prostatic hyperplasia Brother   . Alcohol abuse Brother   . Arthritis Sister   . Esophageal varices Brother   . Cirrhosis Brother   . Diabetes  Maternal Grandmother   . Coronary artery disease Maternal Grandmother   . Diabetes Maternal Grandfather   . Cancer Maternal Aunt   . Birth defects Paternal Aunt     breast    Social History   Social History  . Marital status: Married    Spouse name: N/A  . Number of children: N/A  . Years of education: N/A   Occupational History  . Not on file.   Social History Main Topics  . Smoking status: Never Smoker  . Smokeless tobacco: Never Used  . Alcohol use 0.0 oz/week    0 drink(s) per week     Comment: a couple beers a week  . Drug use: No  . Sexual activity: Yes    Partners: Male   Other Topics Concern  . Not on file   Social History Narrative  . No narrative on file    Outpatient Medications Prior to Visit  Medication Sig Dispense Refill  . ALPRAZolam (XANAX) 1 MG tablet TAKE ONE TABLET BY MOUTH TWICE A DAY AS NEEDED FOR SLEEP AND ANXIETY 180 tablet 1  . Biotin 5000 MCG CAPS Take by mouth.    . fish oil-omega-3 fatty acids 1000 MG capsule Take 1,000 mg by mouth daily. 2 caps     . lovastatin (MEVACOR) 40 MG tablet TAKE 1 TABLET (40 MG TOTAL)  BY MOUTH AT BEDTIME. 90 tablet 2  . zolpidem (AMBIEN) 10 MG tablet TAKE ONE TABLET BY MOUTH AT BEDTIME AS NEEDED FOR SLEEP 90 tablet 1   No facility-administered medications prior to visit.     No Known Allergies  Review of Systems  Constitutional: Negative for fever and malaise/fatigue.  HENT: Negative for congestion.   Eyes: Negative for blurred vision.  Respiratory: Negative for shortness of breath.   Cardiovascular: Negative for chest pain, palpitations and leg swelling.  Gastrointestinal: Positive for abdominal pain, heartburn and nausea. Negative for blood in stool, constipation, diarrhea, melena and vomiting.  Genitourinary: Negative for dysuria, frequency and hematuria.  Skin: Negative for rash.  Neurological: Negative for loss of consciousness and headaches.  Endo/Heme/Allergies: Negative for environmental  allergies.  Psychiatric/Behavioral: Negative for depression. The patient is not nervous/anxious.        Objective:    Physical Exam  BP 108/76 (BP Location: Left Arm, Patient Position: Sitting, Cuff Size: Normal)   Pulse 91   Temp 98.7 F (37.1 C) (Oral)   Wt 139 lb 12.8 oz (63.4 kg)   SpO2 97%   BMI 25.57 kg/m  Wt Readings from Last 3 Encounters:  12/08/16 139 lb 12.8 oz (63.4 kg)  01/15/16 148 lb 4 oz (67.2 kg)  07/06/15 153 lb 4 oz (69.5 kg)     Lab Results  Component Value Date   WBC 4.1 01/15/2016   HGB 13.9 01/15/2016   HCT 40.7 01/15/2016   PLT 198.0 01/15/2016   GLUCOSE 113 (H) 01/15/2016   CHOL 203 (H) 01/15/2016   TRIG 169.0 (H) 01/15/2016   HDL 49.90 01/15/2016   LDLDIRECT 103.0 11/27/2014   LDLCALC 119 (H) 01/15/2016   ALT 22 01/15/2016   AST 18 01/15/2016   NA 141 01/15/2016   K 4.3 01/15/2016   CL 103 01/15/2016   CREATININE 0.86 01/15/2016   BUN 24 (H) 01/15/2016   CO2 32 01/15/2016   TSH 1.43 01/15/2016   HGBA1C 5.9 01/15/2016   MICROALBUR <0.7 07/06/2015    Lab Results  Component Value Date   TSH 1.43 01/15/2016   Lab Results  Component Value Date   WBC 4.1 01/15/2016   HGB 13.9 01/15/2016   HCT 40.7 01/15/2016   MCV 84.1 01/15/2016   PLT 198.0 01/15/2016   Lab Results  Component Value Date   NA 141 01/15/2016   K 4.3 01/15/2016   CO2 32 01/15/2016   GLUCOSE 113 (H) 01/15/2016   BUN 24 (H) 01/15/2016   CREATININE 0.86 01/15/2016   BILITOT 0.3 01/15/2016   ALKPHOS 49 01/15/2016   AST 18 01/15/2016   ALT 22 01/15/2016   PROT 6.9 01/15/2016   ALBUMIN 4.5 01/15/2016   CALCIUM 9.7 01/15/2016   GFR 70.24 01/15/2016   Lab Results  Component Value Date   CHOL 203 (H) 01/15/2016   Lab Results  Component Value Date   HDL 49.90 01/15/2016   Lab Results  Component Value Date   LDLCALC 119 (H) 01/15/2016   Lab Results  Component Value Date   TRIG 169.0 (H) 01/15/2016   Lab Results  Component Value Date   CHOLHDL 4  01/15/2016   Lab Results  Component Value Date   HGBA1C 5.9 01/15/2016       Assessment & Plan:   Problem List Items Addressed This Visit    Mixed hyperlipidemia - Primary   Relevant Orders   Lipid panel   ELEVATED BP READING WITHOUT DX HYPERTENSION  Minimize caffeine and sodium.       Relevant Orders   CBC   Comprehensive metabolic panel   TSH   GERD    Recent flare with symptoms, noting epigastrium discomfort and bloating and some pressure. Check an H pylori, avoid offending foods. Start ranitidine, probiotics and use Mylanta prn. Report if symptoms worsen      Relevant Medications   ranitidine (ZANTAC) 300 MG tablet   Hyperglycemia    hgba1c acceptable,inimize simple carbs. Increase exercise as tolerated. Continue current meds      Relevant Orders   Hemoglobin A1c   Preventative health care   Relevant Orders   CBC   Lipid panel   Atypical chest pain    Likely GI but did get EKG which was WNL. Seek care if symptoms worsen.       Other Visit Diagnoses    Epigastric pain       Relevant Orders   H. pylori antibody, IgG (Completed)   EKG 12-Lead (Completed)      I am having Ms. Thiem start on ranitidine. I am also having her maintain her fish oil-omega-3 fatty acids, Biotin, lovastatin, ALPRAZolam, and zolpidem.  Meds ordered this encounter  Medications  . ranitidine (ZANTAC) 300 MG tablet    Sig: Take 1/2-1 tablet by mouth daily as needed    Dispense:  30 tablet    Refill:  1    CMA served as scribe during this visit. History, Physical and Plan performed by medical provider. Documentation and orders reviewed and attested to.  Penni Homans, MD

## 2016-12-09 LAB — H. PYLORI ANTIBODY, IGG: H Pylori IgG: NEGATIVE

## 2017-01-08 ENCOUNTER — Other Ambulatory Visit (INDEPENDENT_AMBULATORY_CARE_PROVIDER_SITE_OTHER): Payer: BLUE CROSS/BLUE SHIELD

## 2017-01-08 ENCOUNTER — Encounter (HOSPITAL_BASED_OUTPATIENT_CLINIC_OR_DEPARTMENT_OTHER): Payer: Self-pay

## 2017-01-08 ENCOUNTER — Ambulatory Visit (HOSPITAL_BASED_OUTPATIENT_CLINIC_OR_DEPARTMENT_OTHER)
Admission: RE | Admit: 2017-01-08 | Discharge: 2017-01-08 | Disposition: A | Discharge status: Home / Self Care | Payer: BLUE CROSS/BLUE SHIELD | Source: Ambulatory Visit | Attending: Family Medicine | Admitting: Family Medicine

## 2017-01-08 DIAGNOSIS — Z0000 Encounter for general adult medical examination without abnormal findings: Secondary | ICD-10-CM

## 2017-01-08 DIAGNOSIS — R739 Hyperglycemia, unspecified: Secondary | ICD-10-CM

## 2017-01-08 DIAGNOSIS — Z1231 Encounter for screening mammogram for malignant neoplasm of breast: Secondary | ICD-10-CM | POA: Diagnosis present

## 2017-01-08 DIAGNOSIS — R030 Elevated blood-pressure reading, without diagnosis of hypertension: Secondary | ICD-10-CM

## 2017-01-08 DIAGNOSIS — E782 Mixed hyperlipidemia: Secondary | ICD-10-CM | POA: Diagnosis not present

## 2017-01-08 LAB — HEMOGLOBIN A1C: Hgb A1c MFr Bld: 5.9 % (ref 4.6–6.5)

## 2017-01-08 LAB — COMPREHENSIVE METABOLIC PANEL
ALT: 18 U/L (ref 0–35)
AST: 16 U/L (ref 0–37)
Albumin: 4.7 g/dL (ref 3.5–5.2)
Alkaline Phosphatase: 44 U/L (ref 39–117)
BUN: 17 mg/dL (ref 6–23)
CO2: 32 mEq/L (ref 19–32)
Calcium: 9.8 mg/dL (ref 8.4–10.5)
Chloride: 101 mEq/L (ref 96–112)
Creatinine, Ser: 0.78 mg/dL (ref 0.40–1.20)
GFR: 78.38 mL/min (ref >60.00)
Glucose, Bld: 104 mg/dL — ABNORMAL HIGH (ref 70–99)
Potassium: 4.1 mEq/L (ref 3.5–5.1)
Sodium: 140 mEq/L (ref 135–145)
Total Bilirubin: 0.4 mg/dL (ref 0.2–1.2)
Total Protein: 7.2 g/dL (ref 6.0–8.3)

## 2017-01-08 LAB — CBC
HEMATOCRIT: 41.7 % (ref 36.0–46.0)
HEMOGLOBIN: 14.1 g/dL (ref 12.0–15.0)
MCHC: 33.8 g/dL (ref 30.0–36.0)
MCV: 86.6 fl (ref 78.0–100.0)
Platelets: 210 10*3/uL (ref 150.0–400.0)
RBC: 4.81 Mil/uL (ref 3.87–5.11)
RDW: 13.7 % (ref 11.5–15.5)
WBC: 3.9 10*3/uL — ABNORMAL LOW (ref 4.0–10.5)

## 2017-01-08 LAB — TSH: TSH: 2.39 u[IU]/mL (ref 0.35–4.50)

## 2017-01-08 LAB — LIPID PANEL
Cholesterol: 203 mg/dL — ABNORMAL HIGH (ref 0–200)
HDL: 48.4 mg/dL (ref >39.00)
LDL Cholesterol: 117 mg/dL — ABNORMAL HIGH (ref 0–99)
NonHDL: 154.96
Total CHOL/HDL Ratio: 4
Triglycerides: 189 mg/dL — ABNORMAL HIGH (ref 0.0–149.0)
VLDL: 37.8 mg/dL (ref 0.0–40.0)

## 2017-01-19 ENCOUNTER — Encounter: Payer: Self-pay | Admitting: Family Medicine

## 2017-01-19 ENCOUNTER — Ambulatory Visit (INDEPENDENT_AMBULATORY_CARE_PROVIDER_SITE_OTHER): Payer: BLUE CROSS/BLUE SHIELD | Admitting: Family Medicine

## 2017-01-19 VITALS — BP 111/64 | HR 99 | Temp 98.3°F | Ht 62.0 in | Wt 137.4 lb

## 2017-01-19 DIAGNOSIS — Z23 Encounter for immunization: Secondary | ICD-10-CM | POA: Diagnosis not present

## 2017-01-19 DIAGNOSIS — E782 Mixed hyperlipidemia: Secondary | ICD-10-CM

## 2017-01-19 DIAGNOSIS — R739 Hyperglycemia, unspecified: Secondary | ICD-10-CM

## 2017-01-19 DIAGNOSIS — M8580 Other specified disorders of bone density and structure, unspecified site: Secondary | ICD-10-CM

## 2017-01-19 DIAGNOSIS — R109 Unspecified abdominal pain: Secondary | ICD-10-CM

## 2017-01-19 DIAGNOSIS — R000 Tachycardia, unspecified: Secondary | ICD-10-CM

## 2017-01-19 DIAGNOSIS — R0789 Other chest pain: Secondary | ICD-10-CM | POA: Diagnosis not present

## 2017-01-19 DIAGNOSIS — R030 Elevated blood-pressure reading, without diagnosis of hypertension: Secondary | ICD-10-CM

## 2017-01-19 DIAGNOSIS — K219 Gastro-esophageal reflux disease without esophagitis: Secondary | ICD-10-CM | POA: Diagnosis not present

## 2017-01-19 DIAGNOSIS — F418 Other specified anxiety disorders: Secondary | ICD-10-CM

## 2017-01-19 DIAGNOSIS — G4700 Insomnia, unspecified: Secondary | ICD-10-CM

## 2017-01-19 DIAGNOSIS — Z0000 Encounter for general adult medical examination without abnormal findings: Secondary | ICD-10-CM

## 2017-01-19 DIAGNOSIS — R1111 Vomiting without nausea: Secondary | ICD-10-CM

## 2017-01-19 MED ORDER — NITROGLYCERIN 0.4 MG SL SUBL: 0.4000 mg | SUBLINGUAL_TABLET | SUBLINGUAL | 3 refills | Status: DC | PRN

## 2017-01-19 MED ORDER — ALPRAZOLAM 1 MG PO TABS: ORAL_TABLET | ORAL | 1 refills | Status: DC

## 2017-01-19 MED ORDER — RANITIDINE HCL 300 MG PO TABS: ORAL_TABLET | ORAL | 3 refills | Status: DC

## 2017-01-19 MED ORDER — ZOLPIDEM TARTRATE 10 MG PO TABS: 10.0000 mg | ORAL_TABLET | Freq: Every evening | ORAL | 1 refills | Status: DC | PRN

## 2017-01-19 MED ORDER — FLUOXETINE HCL 20 MG PO TABS: ORAL_TABLET | ORAL | 3 refills | Status: DC

## 2017-01-19 NOTE — Progress Notes (Signed)
Patient ID: Tricia Fleming, female   DOB: December 01, 1950, 66 y.o.   MRN: [id number]   Subjective:  I acted as a Education administrator for Penni Homans, [place], [place]   Patient ID: Tricia Fleming, female    DOB: Jun 10, 1951, 66 y.o.   MRN: [id number]  Chief Complaint  Patient presents with  . Annual Exam  . Hyperlipidemia  . Gastroesophageal Reflux    Hyperlipidemia  This is a chronic problem. The problem is controlled. Associated symptoms include chest pain. Pertinent negatives include no shortness of breath.  Gastroesophageal Reflux  She complains of abdominal pain, chest pain and heartburn. She reports no coughing. This is a chronic problem. She has tried a diet change for the symptoms.  Hypertension  This is a chronic problem. The problem is controlled. Associated symptoms include chest pain and palpitations. Pertinent negatives include no blurred vision, headaches, malaise/fatigue or shortness of breath.    Patient is in today for an annual examination. Patient has had some ongoing stomach concerns. Patient has a Hx of hyperlipidemia, insomnia, osteopenia, HTN. Patient has no additional acute concerns noted at this time. She stopped Zantac because she felt it increased constipation. Unfortunately presently she is struggling with episodes of epigastric pressure that occur when she eats. The discomfort radiates to the back. She denies feeling nauseous when this happens but she feels she needs to make herself vomit, if she does this she feels better. She continues to struggle with anxiety and depression. No suicidal ideation but does note some anhedonia. Uses Alprazolam once most days, sometimes 2 or 3 times on a bad day. Denies SOB/HA/congestion/fevers or GU c/o. Taking meds as prescribed. She endorses episodes of chest pain and palpitations infrequently.   Patient Care Team: Mosie Lukes, MD as PCP - General Shelly Rubenstein, NP as Nurse Practitioner (Dermatology)   Past Medical History:  Diagnosis  Date  . Anxiety   . Atypical chest pain 12/08/2016  . Breast lesion 06/18/2010   Qualifier: Diagnosis of  By: Charlett Blake MD, Erline Levine    . Depression   . Depression with anxiety 06/27/2011  . Hyperglycemia 07/27/2013  . Hyperlipidemia   . Osteopenia 01/15/2016  . Preventative health care 06/27/2011  . Preventative health care 06/27/2011  . SCC (squamous cell carcinoma), leg 05/04/2014   SCC   . Shoulder pain, bilateral 02/13/2013    Past Surgical History:  Procedure Laterality Date  . TUBAL LIGATION      Family History  Problem Relation Age of Onset  . Cancer Father     metastic prostate  . Diabetes Mother   . Hypertension Mother   . Coronary artery disease Mother   . Transient ischemic attack Sister   . Heart disease Brother   . Benign prostatic hyperplasia Brother   . Lung disease Brother     in a smoker  . Insulin resistance Brother   . Hypertension Sister   . Benign prostatic hyperplasia Brother   . Alcohol abuse Brother   . Arthritis Sister   . Diabetes Maternal Grandmother   . Coronary artery disease Maternal Grandmother   . Diabetes Maternal Grandfather   . Esophageal varices Brother   . Cirrhosis Brother   . Cancer Maternal Aunt   . Birth defects Paternal Aunt     breast    Social History   Social History  . Marital status: Married    Spouse name: N/A  . Number of children: N/A  . Years of education: N/A   Occupational  History  . Not on file.   Social History Main Topics  . Smoking status: Never Smoker  . Smokeless tobacco: Never Used  . Alcohol use 0.0 oz/week    0 drink(s) per week     Comment: a couple beers a week  . Drug use: No  . Sexual activity: Yes    Partners: Male   Other Topics Concern  . Not on file   Social History Narrative  . No narrative on file    Outpatient Medications Prior to Visit  Medication Sig Dispense Refill  . Biotin 5000 MCG CAPS Take by mouth.    . fish oil-omega-3 fatty acids 1000 MG capsule Take 1,000 mg by mouth  daily. 2 caps     . ALPRAZolam (XANAX) 1 MG tablet TAKE ONE TABLET BY MOUTH TWICE A DAY AS NEEDED FOR SLEEP AND ANXIETY 180 tablet 1  . lovastatin (MEVACOR) 40 MG tablet TAKE 1 TABLET (40 MG TOTAL) BY MOUTH AT BEDTIME. 90 tablet 2  . ranitidine (ZANTAC) 300 MG tablet Take 1/2-1 tablet by mouth daily as needed 30 tablet 1  . zolpidem (AMBIEN) 10 MG tablet TAKE ONE TABLET BY MOUTH AT BEDTIME AS NEEDED FOR SLEEP 90 tablet 1   No facility-administered medications prior to visit.     No Known Allergies  Review of Systems  Constitutional: Negative for fever and malaise/fatigue.  HENT: Negative for congestion.   Eyes: Negative for blurred vision.  Respiratory: Negative for cough and shortness of breath.   Cardiovascular: Positive for chest pain and palpitations. Negative for leg swelling.  Gastrointestinal: Positive for abdominal pain, heartburn and vomiting. Negative for blood in stool, constipation and diarrhea.  Musculoskeletal: Negative for back pain.  Skin: Negative for rash.  Neurological: Negative for loss of consciousness and headaches.  Psychiatric/Behavioral: Positive for depression. The patient is nervous/anxious.        Objective:    Physical Exam  Constitutional: She is oriented to person, place, and time. She appears well-developed and well-nourished. No distress.  HENT:  Head: Normocephalic and atraumatic.  Eyes: Conjunctivae are normal.  Neck: Normal range of motion. No thyromegaly present.  Cardiovascular: Normal rate and regular rhythm.   Pulmonary/Chest: Effort normal and breath sounds normal. She has no wheezes.  Abdominal: Soft. Bowel sounds are normal. There is no tenderness.  Musculoskeletal: She exhibits no edema or deformity.  Neurological: She is alert and oriented to person, place, and time.  Skin: Skin is warm and dry. She is not diaphoretic.  Psychiatric: She has a normal mood and affect.    BP 111/64 (BP Location: Left Arm, Patient Position: Sitting,  Cuff Size: Normal)   Pulse 99   Temp 98.3 F (36.8 C)   Ht 5\' 2"  (1.575 m)   Wt 137 lb 6.4 oz (62.3 kg)   SpO2 98%   BMI 25.13 kg/m  Wt Readings from Last 3 Encounters:  01/19/17 137 lb 6.4 oz (62.3 kg)  12/08/16 139 lb 12.8 oz (63.4 kg)  01/15/16 148 lb 4 oz (67.2 kg)   BP Readings from Last 3 Encounters:  01/19/17 111/64  12/08/16 108/76  01/15/16 112/62     Immunization History  Administered Date(s) Administered  . Influenza Split 08/26/2011  . Influenza,inj,Quad PF,36+ Mos 07/27/2013, 07/24/2014, 07/06/2015  . Pneumococcal Conjugate-13 07/27/2013  . Pneumococcal Polysaccharide-23 07/06/2015  . Td 06/18/2010  . Zoster 01/15/2016    Health Maintenance  Topic Date Due  . INFLUENZA VACCINE  05/27/2016  . MAMMOGRAM  01/09/2019  .  COLONOSCOPY  06/11/2020  . TETANUS/TDAP  06/18/2020  . DEXA SCAN  Completed  . Hepatitis C Screening  Completed  . PNA vac Low Risk Adult  Completed    Lab Results  Component Value Date   WBC 3.9 (L) 01/08/2017   HGB 14.1 01/08/2017   HCT 41.7 01/08/2017   PLT 210.0 01/08/2017   GLUCOSE 104 (H) 01/08/2017   CHOL 203 (H) 01/08/2017   TRIG 189.0 (H) 01/08/2017   HDL 48.40 01/08/2017   LDLDIRECT 103.0 11/27/2014   LDLCALC 117 (H) 01/08/2017   ALT 18 01/08/2017   AST 16 01/08/2017   NA 140 01/08/2017   K 4.1 01/08/2017   CL 101 01/08/2017   CREATININE 0.78 01/08/2017   BUN 17 01/08/2017   CO2 32 01/08/2017   TSH 2.39 01/08/2017   HGBA1C 5.9 01/08/2017   MICROALBUR <0.7 07/06/2015    Lab Results  Component Value Date   TSH 2.39 01/08/2017   Lab Results  Component Value Date   WBC 3.9 (L) 01/08/2017   HGB 14.1 01/08/2017   HCT 41.7 01/08/2017   MCV 86.6 01/08/2017   PLT 210.0 01/08/2017   Lab Results  Component Value Date   NA 140 01/08/2017   K 4.1 01/08/2017   CO2 32 01/08/2017   GLUCOSE 104 (H) 01/08/2017   BUN 17 01/08/2017   CREATININE 0.78 01/08/2017   BILITOT 0.4 01/08/2017   ALKPHOS 44 01/08/2017   AST  16 01/08/2017   ALT 18 01/08/2017   PROT 7.2 01/08/2017   ALBUMIN 4.7 01/08/2017   CALCIUM 9.8 01/08/2017   GFR 78.38 01/08/2017   Lab Results  Component Value Date   CHOL 203 (H) 01/08/2017   Lab Results  Component Value Date   HDL 48.40 01/08/2017   Lab Results  Component Value Date   LDLCALC 117 (H) 01/08/2017   Lab Results  Component Value Date   TRIG 189.0 (H) 01/08/2017   Lab Results  Component Value Date   CHOLHDL 4 01/08/2017   Lab Results  Component Value Date   HGBA1C 5.9 01/08/2017         Assessment & Plan:   Problem List Items Addressed This Visit    Mixed hyperlipidemia    Tolerating statin, encouraged heart healthy diet, avoid trans fats, minimize simple carbs and saturated fats. Increase exercise as tolerated      Relevant Medications   nitroGLYCERIN (NITROSTAT) 0.4 MG SL tablet   ELEVATED BP READING WITHOUT DX HYPERTENSION   Relevant Medications   ALPRAZolam (XANAX) 1 MG tablet   GERD    She stopped Zantac because she felt it increased constipation. Unfortunately presently she is struggling with episodes of epigastric pressure that occur when she eats. The discomfort radiates to the back. She denies feeling nauseous when this happens but she feels she needs to make herself vomit, if she does this she feels better. Will refer to gastroenterology due to worsening symptoms      Relevant Medications   ranitidine (ZANTAC) 300 MG tablet   Insomnia    Uses Ambien 5-10 mg qhs       Depression with anxiety    Uses alprazolam once to twice daily, discussed need for using as little as she is able and that we may need to stop this in the future.      Hyperglycemia    hgba1c acceptable, minimize simple carbs. Increase exercise as tolerated.       Relevant Medications   ALPRAZolam (XANAX) 1  MG tablet   Other Relevant Orders   Ambulatory referral to Cardiology   Preventative health care - Primary    Patient encouraged to maintain heart healthy  diet, regular exercise, adequate sleep. Consider daily probiotics. Take medications as prescribed, labs reviewed      Osteopenia    Encouraged to get adequate exercise, calcium and vitamin d intake      Relevant Medications   ALPRAZolam (XANAX) 1 MG tablet   Atypical chest pain    With some palpitations, tachycardia. Referred to cariology for further consideration.       Relevant Orders   Ambulatory referral to Cardiology    Other Visit Diagnoses    Hyperlipidemia, mixed       Relevant Medications   ALPRAZolam (XANAX) 1 MG tablet   nitroGLYCERIN (NITROSTAT) 0.4 MG SL tablet   Other Relevant Orders   Ambulatory referral to Cardiology   Need for shingles vaccine       Relevant Medications   ALPRAZolam (XANAX) 1 MG tablet   Vomiting without nausea, intractability of vomiting not specified, unspecified vomiting type       Relevant Orders   Ambulatory referral to Gastroenterology   US Abdomen Complete   Gastric pain       Relevant Orders   Ambulatory referral to Gastroenterology   US Abdomen Complete   Tachycardia       Relevant Orders   Ambulatory referral to Cardiology      I have changed Ms. Bebout's zolpidem. I am also having her start on FLUoxetine and nitroGLYCERIN. Additionally, I am having her maintain her fish oil-omega-3 fatty acids, Biotin, ALPRAZolam, and ranitidine.  Meds ordered this encounter  Medications  . ALPRAZolam (XANAX) 1 MG tablet    Sig: TAKE ONE TABLET BY MOUTH TWICE A DAY AS NEEDED FOR SLEEP AND ANXIETY    Dispense:  180 tablet    Refill:  1    This request is for a new prescription for a controlled substance as required by Federal/State law.  . zolpidem (AMBIEN) 10 MG tablet    Sig: Take 1 tablet (10 mg total) by mouth at bedtime as needed. for sleep    Dispense:  90 tablet    Refill:  1    This request is for a new prescription for a controlled substance as required by Federal/State law..  . FLUoxetine (PROZAC) 20 MG tablet    Sig: 1/2 tab  po daily x 7 days then increase to 1 tab po qd    Dispense:  30 tablet    Refill:  3  . nitroGLYCERIN (NITROSTAT) 0.4 MG SL tablet    Sig: Place 1 tablet (0.4 mg total) under the tongue every 5 (five) minutes as needed for chest pain.    Dispense:  25 tablet    Refill:  3  . ranitidine (ZANTAC) 300 MG tablet    Sig: Take 1/2-1 tablet by mouth daily as needed    Dispense:  30 tablet    Refill:  3    CMA served as scribe during this visit. History, Physical and Plan performed by medical provider. Documentation and orders reviewed and attested to.  Penni Homans, MD

## 2017-01-19 NOTE — Progress Notes (Signed)
Pre visit review using our clinic review tool, if applicable. No additional management support is needed unless otherwise documented below in the visit note. 

## 2017-01-19 NOTE — Patient Instructions (Addendum)
Citracal once to twice daily and continue Vitamin D daily  Bone density shows osteopenia, which is thinner than normal but not as bad as osteoporosis. Recommend calcium intake of 1200 to 1500 mg daily, divided into roughly 3 doses. Best source is the diet and a single dairy serving is about 500 mg, a supplement of calcium citrate once or twice daily to balance diet is fine if not getting enough in diet. Also need Vitamin D 2000 IU caps, 1 cap daily if not already taking vitamin D. Also recommend weight baring exercise on hips and upper body to keep bones strong   Preventive Care 65 Years and Older, Female Preventive care refers to lifestyle choices and visits with your health care provider that can promote health and wellness. What does preventive care include?  A yearly physical exam. This is also called an annual well check.  Dental exams once or twice a year.  Routine eye exams. Ask your health care provider how often you should have your eyes checked.  Personal lifestyle choices, including:  Daily care of your teeth and gums.  Regular physical activity.  Eating a healthy diet.  Avoiding tobacco and drug use.  Limiting alcohol use.  Practicing safe sex.  Taking low-dose aspirin every day.  Taking vitamin and mineral supplements as recommended by your health care provider. What happens during an annual well check? The services and screenings done by your health care provider during your annual well check will depend on your age, overall health, lifestyle risk factors, and family history of disease. Counseling  Your health care provider may ask you questions about your:  Alcohol use.  Tobacco use.  Drug use.  Emotional well-being.  Home and relationship well-being.  Sexual activity.  Eating habits.  History of falls.  Memory and ability to understand (cognition).  Work and work Statistician.  Reproductive health. Screening  You may have the following tests  or measurements:  Height, weight, and BMI.  Blood pressure.  Lipid and cholesterol levels. These may be checked every 5 years, or more frequently if you are over 42 years old.  Skin check.  Lung cancer screening. You may have this screening every year starting at age 33 if you have a 30-pack-year history of smoking and currently smoke or have quit within the past 15 years.  Fecal occult blood test (FOBT) of the stool. You may have this test every year starting at age 39.  Flexible sigmoidoscopy or colonoscopy. You may have a sigmoidoscopy every 5 years or a colonoscopy every 10 years starting at age 68.  Hepatitis C blood test.  Hepatitis B blood test.  Sexually transmitted disease (STD) testing.  Diabetes screening. This is done by checking your blood sugar (glucose) after you have not eaten for a while (fasting). You may have this done every 1-3 years.  Bone density scan. This is done to screen for osteoporosis. You may have this done starting at age 49.  Mammogram. This may be done every 1-2 years. Talk to your health care provider about how often you should have regular mammograms. Talk with your health care provider about your test results, treatment options, and if necessary, the need for more tests. Vaccines  Your health care provider may recommend certain vaccines, such as:  Influenza vaccine. This is recommended every year.  Tetanus, diphtheria, and acellular pertussis (Tdap, Td) vaccine. You may need a Td booster every 10 years.  Varicella vaccine. You may need this if you have not been  vaccinated.  Zoster vaccine. You may need this after age 89.  Measles, mumps, and rubella (MMR) vaccine. You may need at least one dose of MMR if you were born in 1957 or later. You may also need a second dose.  Pneumococcal 13-valent conjugate (PCV13) vaccine. One dose is recommended after age 72.  Pneumococcal polysaccharide (PPSV23) vaccine. One dose is recommended after age  27.  Meningococcal vaccine. You may need this if you have certain conditions.  Hepatitis A vaccine. You may need this if you have certain conditions or if you travel or work in places where you may be exposed to hepatitis A.  Hepatitis B vaccine. You may need this if you have certain conditions or if you travel or work in places where you may be exposed to hepatitis B.  Haemophilus influenzae type b (Hib) vaccine. You may need this if you have certain conditions. Talk to your health care provider about which screenings and vaccines you need and how often you need them. This information is not intended to replace advice given to you by your health care provider. Make sure you discuss any questions you have with your health care provider. Document Released: 11/09/2015 Document Revised: 07/02/2016 Document Reviewed: 08/14/2015 Elsevier Interactive Patient Education  2017 [employer].

## 2017-01-19 NOTE — Assessment & Plan Note (Signed)
Encouraged to get adequate exercise, calcium and vitamin d intake 

## 2017-01-19 NOTE — Assessment & Plan Note (Addendum)
She stopped Zantac because she felt it increased constipation. Unfortunately presently she is struggling with episodes of epigastric pressure that occur when she eats. The discomfort radiates to the back. She denies feeling nauseous when this happens but she feels she needs to make herself vomit, if she does this she feels better. Will refer to gastroenterology due to worsening symptoms

## 2017-01-19 NOTE — Assessment & Plan Note (Signed)
Uses Ambien 5-10 mg qhs

## 2017-01-19 NOTE — Assessment & Plan Note (Addendum)
Uses alprazolam once to twice daily, discussed need for using as little as she is able and that we may need to stop this in the future.

## 2017-01-20 ENCOUNTER — Encounter: Payer: BLUE CROSS/BLUE SHIELD | Admitting: Family Medicine

## 2017-01-20 ENCOUNTER — Other Ambulatory Visit: Payer: Self-pay | Admitting: Family Medicine

## 2017-01-23 NOTE — Assessment & Plan Note (Signed)
hgba1c acceptable, minimize simple carbs. Increase exercise as tolerated.  

## 2017-01-23 NOTE — Assessment & Plan Note (Signed)
Tolerating statin, encouraged heart healthy diet, avoid trans fats, minimize simple carbs and saturated fats. Increase exercise as tolerated 

## 2017-01-23 NOTE — Assessment & Plan Note (Signed)
>>  ASSESSMENT AND PLAN FOR MIXED DIABETIC HYPERLIPIDEMIA ASSOCIATED WITH TYPE 2 DIABETES MELLITUS (HCC) WRITTEN ON 01/23/2017  3:08 PM BY BLYTH, STACEY A, MD  hgba1c acceptable, minimize simple carbs. Increase exercise as tolerated.

## 2017-01-23 NOTE — Assessment & Plan Note (Addendum)
Patient encouraged to maintain heart healthy diet, regular exercise, adequate sleep. Consider daily probiotics. Take medications as prescribed, labs reviewed

## 2017-01-23 NOTE — Assessment & Plan Note (Signed)
With some palpitations, tachycardia. Referred to cariology for further consideration.

## 2017-01-29 ENCOUNTER — Encounter: Payer: BLUE CROSS/BLUE SHIELD | Admitting: Family Medicine

## 2017-03-26 ENCOUNTER — Other Ambulatory Visit: Payer: Self-pay | Admitting: Family Medicine

## 2017-03-26 MED ORDER — FLUOXETINE HCL 20 MG PO TABS: 20.0000 mg | ORAL_TABLET | Freq: Every day | ORAL | 0 refills | Status: DC

## 2017-04-30 ENCOUNTER — Ambulatory Visit: Payer: BLUE CROSS/BLUE SHIELD | Admitting: Family Medicine

## 2017-06-25 ENCOUNTER — Other Ambulatory Visit: Payer: Self-pay | Admitting: Family Medicine

## 2017-06-25 NOTE — Telephone Encounter (Signed)
Was advised to F/U in June/denied/needs OV/thx dmf

## 2017-07-20 ENCOUNTER — Other Ambulatory Visit: Payer: Self-pay | Admitting: Family Medicine

## 2017-07-20 DIAGNOSIS — M8580 Other specified disorders of bone density and structure, unspecified site: Secondary | ICD-10-CM

## 2017-07-20 DIAGNOSIS — R739 Hyperglycemia, unspecified: Secondary | ICD-10-CM

## 2017-07-20 DIAGNOSIS — E782 Mixed hyperlipidemia: Secondary | ICD-10-CM

## 2017-07-20 DIAGNOSIS — R030 Elevated blood-pressure reading, without diagnosis of hypertension: Secondary | ICD-10-CM

## 2017-07-20 DIAGNOSIS — Z23 Encounter for immunization: Secondary | ICD-10-CM

## 2017-07-21 NOTE — Telephone Encounter (Signed)
Requesting: Zolpidem & Alprazolam Contract: Yes UDS: 9.28.15 low-risk next screen 3.28.16 Last OV: 3.26.18 Next OV: Not scheduled Last Refill: 6.27.18 & 6.28.18   Please advise

## 2017-10-14 ENCOUNTER — Other Ambulatory Visit: Payer: Self-pay | Admitting: Family Medicine

## 2017-10-14 DIAGNOSIS — R030 Elevated blood-pressure reading, without diagnosis of hypertension: Secondary | ICD-10-CM

## 2017-10-14 DIAGNOSIS — M8580 Other specified disorders of bone density and structure, unspecified site: Secondary | ICD-10-CM

## 2017-10-14 DIAGNOSIS — E782 Mixed hyperlipidemia: Secondary | ICD-10-CM

## 2017-10-14 DIAGNOSIS — Z23 Encounter for immunization: Secondary | ICD-10-CM

## 2017-10-14 DIAGNOSIS — R739 Hyperglycemia, unspecified: Secondary | ICD-10-CM

## 2017-10-15 ENCOUNTER — Other Ambulatory Visit: Payer: Self-pay | Admitting: Family Medicine

## 2017-10-16 ENCOUNTER — Other Ambulatory Visit: Payer: Self-pay | Admitting: Family Medicine

## 2017-10-16 DIAGNOSIS — R030 Elevated blood-pressure reading, without diagnosis of hypertension: Secondary | ICD-10-CM

## 2017-10-16 DIAGNOSIS — Z23 Encounter for immunization: Secondary | ICD-10-CM

## 2017-10-16 DIAGNOSIS — R739 Hyperglycemia, unspecified: Secondary | ICD-10-CM

## 2017-10-16 DIAGNOSIS — E782 Mixed hyperlipidemia: Secondary | ICD-10-CM

## 2017-10-16 DIAGNOSIS — M8580 Other specified disorders of bone density and structure, unspecified site: Secondary | ICD-10-CM

## 2017-10-16 MED ORDER — ALPRAZOLAM 1 MG PO TABS: 1.0000 mg | ORAL_TABLET | Freq: Two times a day (BID) | ORAL | 0 refills | Status: DC | PRN

## 2017-10-16 MED ORDER — ZOLPIDEM TARTRATE 10 MG PO TABS: 10.0000 mg | ORAL_TABLET | Freq: Every evening | ORAL | 0 refills | Status: DC | PRN

## 2017-10-16 NOTE — Telephone Encounter (Signed)
Patient notified

## 2017-10-16 NOTE — Telephone Encounter (Signed)
I have refilled both please update uds and contract

## 2017-10-16 NOTE — Telephone Encounter (Signed)
Pt is requesting refill on alprazolam and Ambien.  Last OV: 01/19/2017 Last Fill on Xanax: 07/21/2017 #180 and 0RF Last Fill on Ambien: 07/21/2017 #90 and 0RF UDS: 2015 Low risk  Please advise.

## 2017-10-16 NOTE — Telephone Encounter (Signed)
Her prescriptions are ready

## 2017-10-19 ENCOUNTER — Telehealth: Payer: Self-pay

## 2017-10-19 NOTE — Telephone Encounter (Signed)
[facility] follow up call. Patient states her medications were incorrectly called in to [facility]. States she is going to [place] this weekend and has to go through there and will pick them up at that time.

## 2017-10-21 NOTE — Telephone Encounter (Signed)
Can we please check in with the patient and make sure she has what she needs and move prescriptions if she needs it.

## 2017-10-22 NOTE — Telephone Encounter (Signed)
Follow up call made to patient. States she is in route to [place] at this time and if RX is not there she will call. Otherwise she is fine.

## 2018-01-18 ENCOUNTER — Other Ambulatory Visit: Payer: Self-pay | Admitting: Family Medicine

## 2018-01-18 NOTE — Telephone Encounter (Signed)
Copied from [place] [id number]. Topic: Quick Communication - Rx Refill/Question >> Jan 18, 2018 11:48 AM Oliver Pila B wrote: Medication: zolpidem (AMBIEN) 10 MG tablet [[id number]] , ALPRAZolam (XANAX) 1 MG tablet [[id number]]  Has the patient contacted their pharmacy? Yes.   (Agent: If no, request that the patient contact the pharmacy for the refill.) Preferred Pharmacy (with phone number [facility] street name[facility] [place]: Please be advised that RX refills may take up to 3 business days. We ask that you follow-up with your pharmacy.

## 2018-01-18 NOTE — Telephone Encounter (Signed)
LOV: 01/19/17  Dr. Charlett Blake  [facility] [place]

## 2018-01-19 NOTE — Telephone Encounter (Signed)
Pt following up on med request.  Pt did not call the pharmacy. Advised pt next time to call them first.  zolpidem (AMBIEN) 10 MG tablet ALPRAZolam (XANAX) 1 MG tablet  [id number] [address], [place] - [address]. [address] [phone] (Phone) [phone] (Fax)    Pt has appt 01/28/18 Pt is out of her meds now.

## 2018-01-20 NOTE — Telephone Encounter (Signed)
Patient called checking on the two medications.  She wants to know since Dr. Charlett Blake is out of the office until Mon 01/25/18, could the prescriptions be signed by another doctor.

## 2018-01-21 NOTE — Telephone Encounter (Signed)
Pt calling checking on scripts. Pt is hoping she can get these filled tomorrow. Pt would like a call to know if this can be done or if she will have to wait on Dr. Charlett Blake.

## 2018-01-22 NOTE — Telephone Encounter (Signed)
Pt is aware dr Charlett Blake is out of office until Monday.

## 2018-01-25 ENCOUNTER — Other Ambulatory Visit: Payer: Self-pay | Admitting: Family Medicine

## 2018-01-25 DIAGNOSIS — R030 Elevated blood-pressure reading, without diagnosis of hypertension: Secondary | ICD-10-CM

## 2018-01-25 DIAGNOSIS — R739 Hyperglycemia, unspecified: Secondary | ICD-10-CM

## 2018-01-25 DIAGNOSIS — Z23 Encounter for immunization: Secondary | ICD-10-CM

## 2018-01-25 DIAGNOSIS — E782 Mixed hyperlipidemia: Secondary | ICD-10-CM

## 2018-01-25 DIAGNOSIS — M8580 Other specified disorders of bone density and structure, unspecified site: Secondary | ICD-10-CM

## 2018-01-25 MED ORDER — ZOLPIDEM TARTRATE 10 MG PO TABS: 10.0000 mg | ORAL_TABLET | Freq: Every evening | ORAL | 0 refills | Status: DC | PRN

## 2018-01-25 MED ORDER — ALPRAZOLAM 1 MG PO TABS: 1.0000 mg | ORAL_TABLET | Freq: Two times a day (BID) | ORAL | 0 refills | Status: DC | PRN

## 2018-01-25 NOTE — Telephone Encounter (Signed)
I sent in refills on both meds but make sure her paperwork is up to date when she comes in this week

## 2018-01-26 ENCOUNTER — Encounter: Payer: BLUE CROSS/BLUE SHIELD | Admitting: Family Medicine

## 2018-01-28 ENCOUNTER — Ambulatory Visit (HOSPITAL_BASED_OUTPATIENT_CLINIC_OR_DEPARTMENT_OTHER)
Admission: RE | Admit: 2018-01-28 | Discharge: 2018-01-28 | Disposition: A | Discharge status: Home / Self Care | Payer: BLUE CROSS/BLUE SHIELD | Source: Ambulatory Visit | Attending: Family Medicine | Admitting: Family Medicine

## 2018-01-28 ENCOUNTER — Encounter: Payer: Self-pay | Admitting: Family Medicine

## 2018-01-28 ENCOUNTER — Ambulatory Visit (INDEPENDENT_AMBULATORY_CARE_PROVIDER_SITE_OTHER): Payer: BLUE CROSS/BLUE SHIELD | Admitting: Family Medicine

## 2018-01-28 ENCOUNTER — Encounter (HOSPITAL_BASED_OUTPATIENT_CLINIC_OR_DEPARTMENT_OTHER): Payer: Self-pay

## 2018-01-28 VITALS — BP 118/70 | HR 90 | Temp 98.3°F | Resp 18 | Ht 62.0 in | Wt 143.8 lb

## 2018-01-28 DIAGNOSIS — M8580 Other specified disorders of bone density and structure, unspecified site: Secondary | ICD-10-CM

## 2018-01-28 DIAGNOSIS — R739 Hyperglycemia, unspecified: Secondary | ICD-10-CM | POA: Diagnosis not present

## 2018-01-28 DIAGNOSIS — Z1231 Encounter for screening mammogram for malignant neoplasm of breast: Secondary | ICD-10-CM

## 2018-01-28 DIAGNOSIS — G4700 Insomnia, unspecified: Secondary | ICD-10-CM | POA: Diagnosis not present

## 2018-01-28 DIAGNOSIS — Z79899 Other long term (current) drug therapy: Secondary | ICD-10-CM | POA: Diagnosis not present

## 2018-01-28 DIAGNOSIS — E782 Mixed hyperlipidemia: Secondary | ICD-10-CM | POA: Diagnosis not present

## 2018-01-28 DIAGNOSIS — J069 Acute upper respiratory infection, unspecified: Secondary | ICD-10-CM | POA: Diagnosis not present

## 2018-01-28 DIAGNOSIS — Z1239 Encounter for other screening for malignant neoplasm of breast: Secondary | ICD-10-CM

## 2018-01-28 DIAGNOSIS — Z0000 Encounter for general adult medical examination without abnormal findings: Secondary | ICD-10-CM

## 2018-01-28 MED ORDER — AMOXICILLIN 500 MG PO CAPS: 500.0000 mg | ORAL_CAPSULE | Freq: Three times a day (TID) | ORAL | 0 refills | Status: DC

## 2018-01-28 NOTE — Assessment & Plan Note (Signed)
Encouraged to get adequate exercise, calcium and vitamin d intake 

## 2018-01-28 NOTE — Assessment & Plan Note (Signed)
Encouraged increased rest and hydration, add probiotics, zinc such as Coldeze or Xicam. Treat fevers as needed, vitamin C and elderberry. Given rx for Amoxicillin in case her symptoms get worse.

## 2018-01-28 NOTE — Assessment & Plan Note (Signed)
Tolerating statin, encouraged heart healthy diet, avoid trans fats, minimize simple carbs and saturated fats. Increase exercise as tolerated 

## 2018-01-28 NOTE — Progress Notes (Signed)
Subjective:  I acted as a Education administrator for Dr. Charlett Blake. [place], [place]   Patient ID: Tricia Fleming, female    DOB: 08-29-1951, 67 y.o.   MRN: [id number]  No chief complaint on file.   HPI  Patient is in today for an annual exam and follow up on chronic medical concerns including hyperglycemia, insomnia, osteopenia, hyperllpdemia and more. She had been feeling well until about 2 days ago when she developed cough, sore throat, malaise, myalgias. No obvious fevers, chills. Flu test is negative. She continues to use Ambien prn with good results and no concerning side effects. Notes she also uses low doses of Alprazolam prn with good results. She is doing well with activities of daily living. She is eating well and exercising at times. Denies CP/palp/SOB/HA/fevers/GI or GU c/o. Taking meds as prescribed  Patient Care Team: Mosie Lukes, MD as PCP - General Shelly Rubenstein, NP as Nurse Practitioner (Dermatology)   Past Medical History:  Diagnosis Date  . Anxiety   . Atypical chest pain 12/08/2016  . Breast lesion 06/18/2010   Qualifier: Diagnosis of  By: Charlett Blake MD, Erline Levine    . Depression   . Depression with anxiety 06/27/2011  . Hyperglycemia 07/27/2013  . Hyperlipidemia   . Osteopenia 01/15/2016  . Preventative health care 06/27/2011  . Preventative health care 06/27/2011  . SCC (squamous cell carcinoma), leg 05/04/2014   SCC   . Shoulder pain, bilateral 02/13/2013    Past Surgical History:  Procedure Laterality Date  . BREAST BIOPSY Left    needle core biopsy, benign  . TUBAL LIGATION      Family History  Problem Relation Age of Onset  . Cancer Father        metastic prostate  . Diabetes Mother   . Hypertension Mother   . Coronary artery disease Mother   . Transient ischemic attack Sister   . Heart disease Brother   . Benign prostatic hyperplasia Brother   . Lung disease Brother        in a smoker  . Insulin resistance Brother   . Hypertension Sister   . Benign prostatic  hyperplasia Brother   . Alcohol abuse Brother   . Arthritis Sister   . Diabetes Maternal Grandmother   . Coronary artery disease Maternal Grandmother   . Diabetes Maternal Grandfather   . Esophageal varices Brother   . Cirrhosis Brother   . Cancer Maternal Aunt   . Birth defects Paternal Aunt        breast    Social History   Socioeconomic History  . Marital status: Married    Spouse name: Not on file  . Number of children: Not on file  . Years of education: Not on file  . Highest education level: Not on file  Occupational History  . Not on file  Social Needs  . Financial resource strain: Not on file  . Food insecurity:    Worry: Not on file    Inability: Not on file  . Transportation needs:    Medical: Not on file    Non-medical: Not on file  Tobacco Use  . Smoking status: Never Smoker  . Smokeless tobacco: Never Used  Substance and Sexual Activity  . Alcohol use: Yes    Alcohol/week: 0.0 oz    Comment: a couple beers a week  . Drug use: No  . Sexual activity: Yes    Partners: Male  Lifestyle  . Physical activity:    Days per  week: Not on file    Minutes per session: Not on file  . Stress: Not on file  Relationships  . Social connections:    Talks on phone: Not on file    Gets together: Not on file    Attends religious service: Not on file    Active member of club or organization: Not on file    Attends meetings of clubs or organizations: Not on file    Relationship status: Not on file  . Intimate partner violence:    Fear of current or ex partner: Not on file    Emotionally abused: Not on file    Physically abused: Not on file    Forced sexual activity: Not on file  Other Topics Concern  . Not on file  Social History Narrative  . Not on file    Outpatient Medications Prior to Visit  Medication Sig Dispense Refill  . ALPRAZolam (XANAX) 1 MG tablet Take 1 tablet (1 mg total) by mouth 2 (two) times daily as needed for anxiety. 180 tablet 0  . Biotin  5000 MCG CAPS Take by mouth.    . fish oil-omega-3 fatty acids 1000 MG capsule Take 1,000 mg by mouth daily. 2 caps     . lovastatin (MEVACOR) 40 MG tablet TAKE 1 TABLET (40 MG TOTAL) BY MOUTH AT BEDTIME. 90 tablet 2  . zolpidem (AMBIEN) 10 MG tablet Take 1 tablet (10 mg total) by mouth at bedtime as needed. for sleep 90 tablet 0  . FLUoxetine (PROZAC) 20 MG tablet Take 1 tablet (20 mg total) by mouth daily. 90 tablet 0  . nitroGLYCERIN (NITROSTAT) 0.4 MG SL tablet Place 1 tablet (0.4 mg total) under the tongue every 5 (five) minutes as needed for chest pain. 25 tablet 3  . ranitidine (ZANTAC) 300 MG tablet Take 1/2-1 tablet by mouth daily as needed 30 tablet 3   No facility-administered medications prior to visit.     No Known Allergies  Review of Systems  Constitutional: Positive for malaise/fatigue. Negative for chills and fever.  HENT: Positive for congestion and sore throat. Negative for hearing loss.   Eyes: Negative for discharge.  Respiratory: Positive for cough. Negative for sputum production, shortness of breath and wheezing.   Cardiovascular: Negative for chest pain, palpitations and leg swelling.  Gastrointestinal: Negative for abdominal pain, blood in stool, constipation, diarrhea, heartburn, nausea and vomiting.  Genitourinary: Negative for dysuria, frequency, hematuria and urgency.  Musculoskeletal: Positive for myalgias. Negative for back pain and falls.  Skin: Negative for rash.  Neurological: Negative for dizziness, sensory change, loss of consciousness, weakness and headaches.  Endo/Heme/Allergies: Negative for environmental allergies. Does not bruise/bleed easily.  Psychiatric/Behavioral: Negative for depression and suicidal ideas. The patient is not nervous/anxious and does not have insomnia.        Objective:    Physical Exam  Constitutional: She is oriented to person, place, and time. She appears well-developed and well-nourished. No distress.  HENT:  Head:  Normocephalic and atraumatic.  Eyes: Conjunctivae are normal.  Neck: Neck supple. No thyromegaly present.  Cardiovascular: Normal rate, regular rhythm and normal heart sounds.  No murmur heard. Pulmonary/Chest: Effort normal and breath sounds normal. No respiratory distress. She exhibits no tenderness.  Abdominal: Soft. Bowel sounds are normal. She exhibits no distension and no mass. There is no tenderness.  Musculoskeletal: She exhibits no edema.  Lymphadenopathy:    She has no cervical adenopathy.  Neurological: She is alert and oriented to person, place, and  time.  Skin: Skin is warm and dry.  Psychiatric: She has a normal mood and affect. Her behavior is normal.    BP 118/70 (BP Location: Left Arm, Patient Position: Sitting, Cuff Size: Normal)   Pulse 90   Temp 98.3 F (36.8 C) (Oral)   Resp 18   Ht 5\' 2"  (1.575 m)   Wt 143 lb 12.8 oz (65.2 kg) Comment: Waist circumference 37inches  SpO2 95%   BMI 26.30 kg/m  Wt Readings from Last 3 Encounters:  01/28/18 143 lb 12.8 oz (65.2 kg)  01/19/17 137 lb 6.4 oz (62.3 kg)  12/08/16 139 lb 12.8 oz (63.4 kg)   BP Readings from Last 3 Encounters:  01/28/18 118/70  01/19/17 111/64  12/08/16 108/76     Immunization History  Administered Date(s) Administered  . Influenza Split 08/26/2011  . Influenza,inj,Quad PF,6+ Mos 07/27/2013, 07/24/2014, 07/06/2015  . Pneumococcal Conjugate-13 07/27/2013  . Pneumococcal Polysaccharide-23 07/06/2015  . Td 06/18/2010  . Zoster 01/15/2016    Health Maintenance  Topic Date Due  . INFLUENZA VACCINE  05/27/2018  . MAMMOGRAM  01/09/2019  . COLONOSCOPY  06/11/2020  . TETANUS/TDAP  06/18/2020  . DEXA SCAN  Completed  . Hepatitis C Screening  Completed  . PNA vac Low Risk Adult  Completed    Lab Results  Component Value Date   WBC 3.9 (L) 01/08/2017   HGB 14.1 01/08/2017   HCT 41.7 01/08/2017   PLT 210.0 01/08/2017   GLUCOSE 104 (H) 01/08/2017   CHOL 203 (H) 01/08/2017   TRIG 189.0 (H)  01/08/2017   HDL 48.40 01/08/2017   LDLDIRECT 103.0 11/27/2014   LDLCALC 117 (H) 01/08/2017   ALT 18 01/08/2017   AST 16 01/08/2017   NA 140 01/08/2017   K 4.1 01/08/2017   CL 101 01/08/2017   CREATININE 0.78 01/08/2017   BUN 17 01/08/2017   CO2 32 01/08/2017   TSH 2.39 01/08/2017   HGBA1C 5.9 01/08/2017   MICROALBUR <0.7 07/06/2015    Lab Results  Component Value Date   TSH 2.39 01/08/2017   Lab Results  Component Value Date   WBC 3.9 (L) 01/08/2017   HGB 14.1 01/08/2017   HCT 41.7 01/08/2017   MCV 86.6 01/08/2017   PLT 210.0 01/08/2017   Lab Results  Component Value Date   NA 140 01/08/2017   K 4.1 01/08/2017   CO2 32 01/08/2017   GLUCOSE 104 (H) 01/08/2017   BUN 17 01/08/2017   CREATININE 0.78 01/08/2017   BILITOT 0.4 01/08/2017   ALKPHOS 44 01/08/2017   AST 16 01/08/2017   ALT 18 01/08/2017   PROT 7.2 01/08/2017   ALBUMIN 4.7 01/08/2017   CALCIUM 9.8 01/08/2017   GFR 78.38 01/08/2017   Lab Results  Component Value Date   CHOL 203 (H) 01/08/2017   Lab Results  Component Value Date   HDL 48.40 01/08/2017   Lab Results  Component Value Date   LDLCALC 117 (H) 01/08/2017   Lab Results  Component Value Date   TRIG 189.0 (H) 01/08/2017   Lab Results  Component Value Date   CHOLHDL 4 01/08/2017   Lab Results  Component Value Date   HGBA1C 5.9 01/08/2017         Assessment & Plan:   Problem List Items Addressed This Visit    Mixed hyperlipidemia    Tolerating statin, encouraged heart healthy diet, avoid trans fats, minimize simple carbs and saturated fats. Increase exercise as tolerated  Relevant Orders   Lipid panel   TSH   Insomnia    Encouraged good sleep hygiene such as dark, quiet room. No blue/green glowing lights such as computer screens in bedroom. No alcohol or stimulants in evening. Cut down on caffeine as able. Regular exercise is helpful but not just prior to bed time. Can continue current meds, uds and contract updated       Relevant Orders   Comprehensive metabolic panel   Hyperglycemia    hgba1c acceptable, minimize simple carbs. Increase exercise as tolerated.       Relevant Orders   Hemoglobin A1c   TSH   Preventative health care    Patient encouraged to maintain heart healthy diet, regular exercise, adequate sleep. Consider daily probiotics. Take medications as prescribed       Relevant Orders   CBC   Osteopenia    Encouraged to get adequate exercise, calcium and vitamin d intake      Upper respiratory infection    Encouraged increased rest and hydration, add probiotics, zinc such as Coldeze or Xicam. Treat fevers as needed, vitamin C and elderberry. Given rx for Amoxicillin in case her symptoms get worse.        Other Visit Diagnoses    High risk medication use    -  Primary   Relevant Orders   Pain Mgmt, Profile 8 w/Conf, U   Breast cancer screening       Relevant Orders   MM SCREENING BREAST TOMO BILATERAL      I have discontinued Horris Latino H. Cockerham's nitroGLYCERIN, ranitidine, and FLUoxetine. I am also having her start on amoxicillin. Additionally, I am having her maintain her fish oil-omega-3 fatty acids, Biotin, lovastatin, ALPRAZolam, and zolpidem.  Meds ordered this encounter  Medications  . amoxicillin (AMOXIL) 500 MG capsule    Sig: Take 1 capsule (500 mg total) by mouth 3 (three) times daily.    Dispense:  30 capsule    Refill:  0    CMA served as scribe during this visit. History, Physical and Plan performed by medical provider. Documentation and orders reviewed and attested to.  Penni Homans, MD

## 2018-01-28 NOTE — Assessment & Plan Note (Signed)
Encouraged good sleep hygiene such as dark, quiet room. No blue/green glowing lights such as computer screens in bedroom. No alcohol or stimulants in evening. Cut down on caffeine as able. Regular exercise is helpful but not just prior to bed time. Can continue current meds, uds and contract updated

## 2018-01-28 NOTE — Assessment & Plan Note (Signed)
Patient encouraged to maintain heart healthy diet, regular exercise, adequate sleep. Consider daily probiotics. Take medications as prescribed 

## 2018-01-28 NOTE — Assessment & Plan Note (Signed)
>>  ASSESSMENT AND PLAN FOR MIXED DIABETIC HYPERLIPIDEMIA ASSOCIATED WITH TYPE 2 DIABETES MELLITUS (HCC) WRITTEN ON 01/28/2018  2:34 PM BY BLYTH, STACEY A, MD  hgba1c acceptable, minimize simple carbs. Increase exercise as tolerated.

## 2018-01-28 NOTE — Patient Instructions (Addendum)
Shingrix is the shingles shot 2 shots over 2-6 months at pharmacy.   Encouraged increased rest and hydration, add probiotics, zinc such as Coldeze or Xicam. Treat fevers as needed can use Mucinex twice daily. Vitamin C 500 mg daily and elderberry Preventive Care 65 Years and Older, Female Preventive care refers to lifestyle choices and visits with your health care provider that can promote health and wellness. What does preventive care include?  A yearly physical exam. This is also called an annual well check.  Dental exams once or twice a year.  Routine eye exams. Ask your health care provider how often you should have your eyes checked.  Personal lifestyle choices, including: ? Daily care of your teeth and gums. ? Regular physical activity. ? Eating a healthy diet. ? Avoiding tobacco and drug use. ? Limiting alcohol use. ? Practicing safe sex. ? Taking low-dose aspirin every day. ? Taking vitamin and mineral supplements as recommended by your health care provider. What happens during an annual well check? The services and screenings done by your health care provider during your annual well check will depend on your age, overall health, lifestyle risk factors, and family history of disease. Counseling Your health care provider may ask you questions about your:  Alcohol use.  Tobacco use.  Drug use.  Emotional well-being.  Home and relationship well-being.  Sexual activity.  Eating habits.  History of falls.  Memory and ability to understand (cognition).  Work and work Statistician.  Reproductive health.  Screening You may have the following tests or measurements:  Height, weight, and BMI.  Blood pressure.  Lipid and cholesterol levels. These may be checked every 5 years, or more frequently if you are over 40 years old.  Skin check.  Lung cancer screening. You may have this screening every year starting at age 47 if you have a 30-pack-year history of smoking  and currently smoke or have quit within the past 15 years.  Fecal occult blood test (FOBT) of the stool. You may have this test every year starting at age 43.  Flexible sigmoidoscopy or colonoscopy. You may have a sigmoidoscopy every 5 years or a colonoscopy every 10 years starting at age 19.  Hepatitis C blood test.  Hepatitis B blood test.  Sexually transmitted disease (STD) testing.  Diabetes screening. This is done by checking your blood sugar (glucose) after you have not eaten for a while (fasting). You may have this done every 1-3 years.  Bone density scan. This is done to screen for osteoporosis. You may have this done starting at age 13.  Mammogram. This may be done every 1-2 years. Talk to your health care provider about how often you should have regular mammograms.  Talk with your health care provider about your test results, treatment options, and if necessary, the need for more tests. Vaccines Your health care provider may recommend certain vaccines, such as:  Influenza vaccine. This is recommended every year.  Tetanus, diphtheria, and acellular pertussis (Tdap, Td) vaccine. You may need a Td booster every 10 years.  Varicella vaccine. You may need this if you have not been vaccinated.  Zoster vaccine. You may need this after age 57.  Measles, mumps, and rubella (MMR) vaccine. You may need at least one dose of MMR if you were born in 1957 or later. You may also need a second dose.  Pneumococcal 13-valent conjugate (PCV13) vaccine. One dose is recommended after age 52.  Pneumococcal polysaccharide (PPSV23) vaccine. One dose is recommended  after age 31.  Meningococcal vaccine. You may need this if you have certain conditions.  Hepatitis A vaccine. You may need this if you have certain conditions or if you travel or work in places where you may be exposed to hepatitis A.  Hepatitis B vaccine. You may need this if you have certain conditions or if you travel or work in  places where you may be exposed to hepatitis B.  Haemophilus influenzae type b (Hib) vaccine. You may need this if you have certain conditions.  Talk to your health care provider about which screenings and vaccines you need and how often you need them. This information is not intended to replace advice given to you by your health care provider. Make sure you discuss any questions you have with your health care provider. Document Released: 11/09/2015 Document Revised: 07/02/2016 Document Reviewed: 08/14/2015 Elsevier Interactive Patient Education  [employer].

## 2018-01-28 NOTE — Assessment & Plan Note (Signed)
hgba1c acceptable, minimize simple carbs. Increase exercise as tolerated.  

## 2018-01-29 LAB — CBC
HEMATOCRIT: 45.9 % (ref 36.0–46.0)
HEMOGLOBIN: 15.7 g/dL — AB (ref 12.0–15.0)
MCHC: 34.1 g/dL (ref 30.0–36.0)
MCV: 86.5 fl (ref 78.0–100.0)
PLATELETS: 285 10*3/uL (ref 150.0–400.0)
RBC: 5.3 Mil/uL — AB (ref 3.87–5.11)
RDW: 13.5 % (ref 11.5–15.5)
WBC: 5.1 10*3/uL (ref 4.0–10.5)

## 2018-01-29 LAB — LIPID PANEL
CHOLESTEROL: 210 mg/dL — AB (ref 0–200)
HDL: 53.3 mg/dL (ref >39.00)
LDL Cholesterol: 127 mg/dL — ABNORMAL HIGH (ref 0–99)
NONHDL: 156.67
Total CHOL/HDL Ratio: 4
Triglycerides: 147 mg/dL (ref 0.0–149.0)
VLDL: 29.4 mg/dL (ref 0.0–40.0)

## 2018-01-29 LAB — COMPREHENSIVE METABOLIC PANEL
ALBUMIN: 4.6 g/dL (ref 3.5–5.2)
ALK PHOS: 55 U/L (ref 39–117)
ALT: 21 U/L (ref 0–35)
AST: 20 U/L (ref 0–37)
BUN: 17 mg/dL (ref 6–23)
CALCIUM: 9.8 mg/dL (ref 8.4–10.5)
CO2: 33 mEq/L — ABNORMAL HIGH (ref 19–32)
Chloride: 99 mEq/L (ref 96–112)
Creatinine, Ser: 0.87 mg/dL (ref 0.40–1.20)
GFR: 68.88 mL/min (ref >60.00)
GLUCOSE: 92 mg/dL (ref 70–99)
POTASSIUM: 4.3 meq/L (ref 3.5–5.1)
Sodium: 138 mEq/L (ref 135–145)
TOTAL PROTEIN: 7.4 g/dL (ref 6.0–8.3)
Total Bilirubin: 0.4 mg/dL (ref 0.2–1.2)

## 2018-01-29 LAB — HEMOGLOBIN A1C: HEMOGLOBIN A1C: 5.8 % (ref 4.6–6.5)

## 2018-01-29 LAB — TSH: TSH: 1.77 u[IU]/mL (ref 0.35–4.50)

## 2018-02-01 LAB — PAIN MGMT, PROFILE 8 W/CONF, U
6 ACETYLMORPHINE: NEGATIVE ng/mL (ref <10)
ALPHAHYDROXYALPRAZOLAM: 1179 ng/mL — AB (ref <25)
ALPHAHYDROXYMIDAZOLAM: NEGATIVE ng/mL (ref <50)
ALPHAHYDROXYTRIAZOLAM: NEGATIVE ng/mL (ref <50)
Alcohol Metabolites: NEGATIVE ng/mL (ref <500)
Aminoclonazepam: NEGATIVE ng/mL (ref <25)
Amphetamines: NEGATIVE ng/mL (ref <500)
BENZODIAZEPINES: POSITIVE ng/mL — AB (ref <100)
Buprenorphine, Urine: NEGATIVE ng/mL (ref <5)
Cocaine Metabolite: NEGATIVE ng/mL (ref <150)
Creatinine: 114.6 mg/dL
HYDROXYETHYLFLURAZEPAM: NEGATIVE ng/mL (ref <50)
Lorazepam: NEGATIVE ng/mL (ref <50)
MARIJUANA METABOLITE: NEGATIVE ng/mL (ref <20)
MDMA: NEGATIVE ng/mL (ref <500)
NORDIAZEPAM: NEGATIVE ng/mL (ref <50)
OPIATES: NEGATIVE ng/mL (ref <100)
OXAZEPAM: NEGATIVE ng/mL (ref <50)
OXYCODONE: NEGATIVE ng/mL (ref <100)
Oxidant: NEGATIVE ug/mL (ref <200)
Temazepam: NEGATIVE ng/mL (ref <50)
pH: 6.89 (ref 4.5–9.0)

## 2018-04-18 ENCOUNTER — Other Ambulatory Visit: Payer: Self-pay | Admitting: Family Medicine

## 2018-04-18 DIAGNOSIS — Z23 Encounter for immunization: Secondary | ICD-10-CM

## 2018-04-18 DIAGNOSIS — R739 Hyperglycemia, unspecified: Secondary | ICD-10-CM

## 2018-04-18 DIAGNOSIS — E782 Mixed hyperlipidemia: Secondary | ICD-10-CM

## 2018-04-18 DIAGNOSIS — R030 Elevated blood-pressure reading, without diagnosis of hypertension: Secondary | ICD-10-CM

## 2018-04-18 DIAGNOSIS — M8580 Other specified disorders of bone density and structure, unspecified site: Secondary | ICD-10-CM

## 2018-04-19 NOTE — Telephone Encounter (Signed)
Refill request: Zolpidem & Alprazolam  Last RX: 01/26/15 Last OV: 01/28/2018 Next OV: 08/05/18 UDS:01/28/18 CSC:01/28/18

## 2018-04-20 ENCOUNTER — Telehealth: Payer: Self-pay

## 2018-04-20 NOTE — Telephone Encounter (Signed)
Please contact pharmacy and give VO to refill

## 2018-04-20 NOTE — Telephone Encounter (Signed)
Copied from [place] [phone]. Topic: General - Other >> Apr 20, 2018 12:57 PM Tricia Fleming wrote: Pt called in and stated the pharmacy advised her they couldn't refill her xanax or ambien before June 27 without authorization.. She stated she is going out of town in the morning.  [id number] - [address], [place] - [address] [phone] (Phone) [phone] (Fax)

## 2018-04-23 NOTE — Telephone Encounter (Signed)
Patient has medication

## 2018-07-10 ENCOUNTER — Other Ambulatory Visit: Payer: Self-pay | Admitting: Family Medicine

## 2018-08-05 ENCOUNTER — Ambulatory Visit: Payer: BLUE CROSS/BLUE SHIELD | Admitting: Family Medicine

## 2018-08-05 VITALS — BP 122/78 | HR 76 | Temp 97.7°F | Resp 18 | Wt 155.6 lb

## 2018-08-05 DIAGNOSIS — G4700 Insomnia, unspecified: Secondary | ICD-10-CM

## 2018-08-05 DIAGNOSIS — Z23 Encounter for immunization: Secondary | ICD-10-CM

## 2018-08-05 DIAGNOSIS — R030 Elevated blood-pressure reading, without diagnosis of hypertension: Secondary | ICD-10-CM | POA: Diagnosis not present

## 2018-08-05 DIAGNOSIS — F418 Other specified anxiety disorders: Secondary | ICD-10-CM

## 2018-08-05 DIAGNOSIS — Z79899 Other long term (current) drug therapy: Secondary | ICD-10-CM | POA: Diagnosis not present

## 2018-08-05 DIAGNOSIS — E782 Mixed hyperlipidemia: Secondary | ICD-10-CM

## 2018-08-05 DIAGNOSIS — D582 Other hemoglobinopathies: Secondary | ICD-10-CM | POA: Diagnosis not present

## 2018-08-05 DIAGNOSIS — R739 Hyperglycemia, unspecified: Secondary | ICD-10-CM | POA: Diagnosis not present

## 2018-08-05 DIAGNOSIS — M8580 Other specified disorders of bone density and structure, unspecified site: Secondary | ICD-10-CM

## 2018-08-05 LAB — CBC
HEMATOCRIT: 43.9 % (ref 36.0–46.0)
Hemoglobin: 14.6 g/dL (ref 12.0–15.0)
MCHC: 33.3 g/dL (ref 30.0–36.0)
MCV: 88.4 fl (ref 78.0–100.0)
Platelets: 209 10*3/uL (ref 150.0–400.0)
RBC: 4.96 Mil/uL (ref 3.87–5.11)
RDW: 13.3 % (ref 11.5–15.5)
WBC: 4.1 10*3/uL (ref 4.0–10.5)

## 2018-08-05 LAB — COMPREHENSIVE METABOLIC PANEL
ALBUMIN: 4.8 g/dL (ref 3.5–5.2)
ALT: 31 U/L (ref 0–35)
AST: 21 U/L (ref 0–37)
Alkaline Phosphatase: 53 U/L (ref 39–117)
BUN: 14 mg/dL (ref 6–23)
CHLORIDE: 102 meq/L (ref 96–112)
CO2: 33 mEq/L — ABNORMAL HIGH (ref 19–32)
CREATININE: 0.8 mg/dL (ref 0.40–1.20)
Calcium: 9.9 mg/dL (ref 8.4–10.5)
GFR: 75.77 mL/min (ref >60.00)
GLUCOSE: 99 mg/dL (ref 70–99)
POTASSIUM: 4.5 meq/L (ref 3.5–5.1)
SODIUM: 141 meq/L (ref 135–145)
TOTAL PROTEIN: 7.2 g/dL (ref 6.0–8.3)
Total Bilirubin: 0.4 mg/dL (ref 0.2–1.2)

## 2018-08-05 LAB — LIPID PANEL
Cholesterol: 192 mg/dL (ref 0–200)
HDL: 54.8 mg/dL (ref >39.00)
NonHDL: 137.2
Total CHOL/HDL Ratio: 4
Triglycerides: 208 mg/dL — ABNORMAL HIGH (ref 0.0–149.0)
VLDL: 41.6 mg/dL — AB (ref 0.0–40.0)

## 2018-08-05 LAB — HEMOGLOBIN A1C: Hgb A1c MFr Bld: 5.8 % (ref 4.6–6.5)

## 2018-08-05 LAB — TSH: TSH: 1.55 u[IU]/mL (ref 0.35–4.50)

## 2018-08-05 LAB — LDL CHOLESTEROL, DIRECT: LDL DIRECT: 113 mg/dL

## 2018-08-05 MED ORDER — ALPRAZOLAM 1 MG PO TABS: 1.0000 mg | ORAL_TABLET | Freq: Two times a day (BID) | ORAL | 1 refills | Status: DC | PRN

## 2018-08-05 MED ORDER — ZOLPIDEM TARTRATE 10 MG PO TABS: 10.0000 mg | ORAL_TABLET | Freq: Every evening | ORAL | 0 refills | Status: DC | PRN

## 2018-08-05 MED ORDER — PANTOPRAZOLE SODIUM 40 MG PO TBEC: 40.0000 mg | DELAYED_RELEASE_TABLET | Freq: Every day | ORAL | 1 refills | Status: DC | PRN

## 2018-08-05 NOTE — Assessment & Plan Note (Signed)
Recheck CBC. 

## 2018-08-05 NOTE — Assessment & Plan Note (Addendum)
She lost her son to drug addiction just 3 months.ago. She is undergoing counseling. Continue Alprazolam prn and consider SSRI but she declines for now. Counseled for 40 minutes of visit.

## 2018-08-05 NOTE — Assessment & Plan Note (Signed)
Encouraged good sleep hygiene such as dark, quiet room. No blue/green glowing lights such as computer screens in bedroom. No alcohol or stimulants in evening. Cut down on caffeine as able. Regular exercise is helpful but not just prior to bed time. Doing well on Ambien allowed refills

## 2018-08-05 NOTE — Patient Instructions (Signed)
Shingrix is the new shingles shot 2 shots over 2-6 months. Call insurance regarding coverage and can call for nurse appointment Carbohydrate Counting for Diabetes Mellitus, Adult Carbohydrate counting is a method for keeping track of how many carbohydrates you eat. Eating carbohydrates naturally increases the amount of sugar (glucose) in the blood. Counting how many carbohydrates you eat helps keep your blood glucose within normal limits, which helps you manage your diabetes (diabetes mellitus). It is important to know how many carbohydrates you can safely have in each meal. This is different for every person. A diet and nutrition specialist (registered dietitian) can help you make a meal plan and calculate how many carbohydrates you should have at each meal and snack. Carbohydrates are found in the following foods:  Grains, such as breads and cereals.  Dried beans and soy products.  Starchy vegetables, such as potatoes, peas, and corn.  Fruit and fruit juices.  Milk and yogurt.  Sweets and snack foods, such as cake, cookies, candy, chips, and soft drinks.  How do I count carbohydrates? There are two ways to count carbohydrates in food. You can use either of the methods or a combination of both. Reading "Nutrition Facts" on packaged food The "Nutrition Facts" list is included on the labels of almost all packaged foods and beverages in the [place] It includes:  The serving size.  Information about nutrients in each serving, including the grams (g) of carbohydrate per serving.  To use the "Nutrition Facts":  Decide how many servings you will have.  Multiply the number of servings by the number of carbohydrates per serving.  The resulting number is the total amount of carbohydrates that you will be having.  Learning standard serving sizes of other foods When you eat foods containing carbohydrates that are not packaged or do not include "Nutrition Facts" on the label, you need to measure  the servings in order to count the amount of carbohydrates:  Measure the foods that you will eat with a food scale or measuring cup, if needed.  Decide how many standard-size servings you will eat.  Multiply the number of servings by 15. Most carbohydrate-rich foods have about 15 g of carbohydrates per serving. ? For example, if you eat 8 oz (170 g) of strawberries, you will have eaten 2 servings and 30 g of carbohydrates (2 servings x 15 g = 30 g).  For foods that have more than one food mixed, such as soups and casseroles, you must count the carbohydrates in each food that is included.  The following list contains standard serving sizes of common carbohydrate-rich foods. Each of these servings has about 15 g of carbohydrates:   hamburger bun or  English muffin.   oz (15 mL) syrup.   oz (14 g) jelly.  1 slice of bread.  1 six-inch tortilla.  3 oz (85 g) cooked rice or pasta.  4 oz (113 g) cooked dried beans.  4 oz (113 g) starchy vegetable, such as peas, corn, or potatoes.  4 oz (113 g) hot cereal.  4 oz (113 g) mashed potatoes or  of a large baked potato.  4 oz (113 g) canned or frozen fruit.  4 oz (120 mL) fruit juice.  4-6 crackers.  6 chicken nuggets.  6 oz (170 g) unsweetened dry cereal.  6 oz (170 g) plain fat-free yogurt or yogurt sweetened with artificial sweeteners.  8 oz (240 mL) milk.  8 oz (170 g) fresh fruit or one small piece of fruit.  24 oz (680 g) popped popcorn.  Example of carbohydrate counting Sample meal  3 oz (85 g) chicken breast.  6 oz (170 g) brown rice.  4 oz (113 g) corn.  8 oz (240 mL) milk.  8 oz (170 g) strawberries with sugar-free whipped topping. Carbohydrate calculation 1. Identify the foods that contain carbohydrates: ? Rice. ? Corn. ? Milk. ? Strawberries. 2. Calculate how many servings you have of each food: ? 2 servings rice. ? 1 serving corn. ? 1 serving milk. ? 1 serving strawberries. 3. Multiply  each number of servings by 15 g: ? 2 servings rice x 15 g = 30 g. ? 1 serving corn x 15 g = 15 g. ? 1 serving milk x 15 g = 15 g. ? 1 serving strawberries x 15 g = 15 g. 4. Add together all of the amounts to find the total grams of carbohydrates eaten: ? 30 g + 15 g + 15 g + 15 g = 75 g of carbohydrates total. This information is not intended to replace advice given to you by your health care provider. Make sure you discuss any questions you have with your health care provider. Document Released: 10/13/2005 Document Revised: 05/02/2016 Document Reviewed: 03/26/2016 Elsevier Interactive Patient Education  [employer].

## 2018-08-05 NOTE — Assessment & Plan Note (Signed)
Tolerating statin, encouraged heart healthy diet, avoid trans fats, minimize simple carbs and saturated fats. Increase exercise as tolerated 

## 2018-08-07 LAB — PAIN MGMT, PROFILE 8 W/CONF, U
6 ACETYLMORPHINE: NEGATIVE ng/mL (ref <10)
ALPHAHYDROXYMIDAZOLAM: NEGATIVE ng/mL (ref <50)
Alcohol Metabolites: NEGATIVE ng/mL (ref <500)
Alphahydroxyalprazolam: 179 ng/mL — ABNORMAL HIGH (ref <25)
Alphahydroxytriazolam: NEGATIVE ng/mL (ref <50)
Aminoclonazepam: NEGATIVE ng/mL (ref <25)
Amphetamines: NEGATIVE ng/mL (ref <500)
Benzodiazepines: POSITIVE ng/mL — AB (ref <100)
Buprenorphine, Urine: NEGATIVE ng/mL (ref <5)
Cocaine Metabolite: NEGATIVE ng/mL (ref <150)
Creatinine: 26.8 mg/dL
Hydroxyethylflurazepam: NEGATIVE ng/mL (ref <50)
Lorazepam: NEGATIVE ng/mL (ref <50)
MDMA: NEGATIVE ng/mL (ref <500)
Marijuana Metabolite: NEGATIVE ng/mL (ref <20)
NORDIAZEPAM: NEGATIVE ng/mL (ref <50)
OPIATES: NEGATIVE ng/mL (ref <100)
OXIDANT: NEGATIVE ug/mL (ref <200)
Oxazepam: 62 ng/mL — ABNORMAL HIGH (ref <50)
Oxycodone: NEGATIVE ng/mL (ref <100)
Temazepam: 51 ng/mL — ABNORMAL HIGH (ref <50)
pH: 6.74 (ref 4.5–9.0)

## 2018-08-08 NOTE — Assessment & Plan Note (Signed)
hgba1c acceptable, minimize simple carbs. Increase exercise as tolerated.  

## 2018-08-08 NOTE — Assessment & Plan Note (Signed)
>>  ASSESSMENT AND PLAN FOR MIXED DIABETIC HYPERLIPIDEMIA ASSOCIATED WITH TYPE 2 DIABETES MELLITUS (HCC) WRITTEN ON 08/08/2018  1:08 PM BY BLYTH, STACEY A, MD  hgba1c acceptable, minimize simple carbs. Increase exercise as tolerated.

## 2018-08-08 NOTE — Progress Notes (Signed)
Subjective:    Patient ID: Tricia Fleming, female    DOB: 1951-03-23, 67 y.o.   MRN: [id number]  No chief complaint on file.   HPI Patient is in today for follow up and she is very tearful. Her son died recently and she is overwhelmed. He struggled with drug addiction which finally took him. She is undergoing counseling but is still overwhelmed. She is noting anhedonia and anxiety attacks with crying, shaking, racing thoughts, palpitations and more. No suicidal ideation or chest pain. Denies CP/HA/congestion/fevers/GI or GU c/o. Taking meds as prescribed. She notes fatigue and even some palpitations when she is tremulous at times. She has trouble sleeping.   Past Medical History:  Diagnosis Date  . Anxiety   . Atypical chest pain 12/08/2016  . Breast lesion 06/18/2010   Qualifier: Diagnosis of  By: Charlett Blake MD, Erline Levine    . Depression   . Depression with anxiety 06/27/2011  . Hyperglycemia 07/27/2013  . Hyperlipidemia   . Osteopenia 01/15/2016  . Preventative health care 06/27/2011  . Preventative health care 06/27/2011  . SCC (squamous cell carcinoma), leg 05/04/2014   SCC   . Shoulder pain, bilateral 02/13/2013    Past Surgical History:  Procedure Laterality Date  . BREAST BIOPSY Left    needle core biopsy, benign  . TUBAL LIGATION      Family History  Problem Relation Age of Onset  . Cancer Father        metastic prostate  . Diabetes Mother   . Hypertension Mother   . Coronary artery disease Mother   . Transient ischemic attack Sister   . Heart disease Brother   . Benign prostatic hyperplasia Brother   . Lung disease Brother        in a smoker  . Insulin resistance Brother   . Hypertension Sister   . Benign prostatic hyperplasia Brother   . Alcohol abuse Brother   . Arthritis Sister   . Diabetes Maternal Grandmother   . Coronary artery disease Maternal Grandmother   . Diabetes Maternal Grandfather   . Esophageal varices Brother   . Cirrhosis Brother   . Cancer  Maternal Aunt   . Birth defects Paternal Aunt        breast    Social History   Socioeconomic History  . Marital status: Married    Spouse name: Not on file  . Number of children: Not on file  . Years of education: Not on file  . Highest education level: Not on file  Occupational History  . Not on file  Social Needs  . Financial resource strain: Not on file  . Food insecurity:    Worry: Not on file    Inability: Not on file  . Transportation needs:    Medical: Not on file    Non-medical: Not on file  Tobacco Use  . Smoking status: Never Smoker  . Smokeless tobacco: Never Used  Substance and Sexual Activity  . Alcohol use: Yes    Alcohol/week: 0.0 standard drinks    Comment: a couple beers a week  . Drug use: No  . Sexual activity: Yes    Partners: Male  Lifestyle  . Physical activity:    Days per week: Not on file    Minutes per session: Not on file  . Stress: Not on file  Relationships  . Social connections:    Talks on phone: Not on file    Gets together: Not on file  Attends religious service: Not on file    Active member of club or organization: Not on file    Attends meetings of clubs or organizations: Not on file    Relationship status: Not on file  . Intimate partner violence:    Fear of current or ex partner: Not on file    Emotionally abused: Not on file    Physically abused: Not on file    Forced sexual activity: Not on file  Other Topics Concern  . Not on file  Social History Narrative  . Not on file    Outpatient Medications Prior to Visit  Medication Sig Dispense Refill  . amoxicillin (AMOXIL) 500 MG capsule Take 1 capsule (500 mg total) by mouth 3 (three) times daily. 30 capsule 0  . Biotin 5000 MCG CAPS Take by mouth.    . fish oil-omega-3 fatty acids 1000 MG capsule Take 1,000 mg by mouth daily. 2 caps     . lovastatin (MEVACOR) 40 MG tablet TAKE 1 TABLET (40 MG TOTAL) BY MOUTH AT BEDTIME. 90 tablet 2  . ALPRAZolam (XANAX) 1 MG tablet  TAKE 1 TABLET (1 MG TOTAL) BY MOUTH 2 (TWO) TIMES DAILY AS NEEDED FOR ANXIETY. 180 tablet 1  . zolpidem (AMBIEN) 10 MG tablet Take 1 tablet (10 mg total) by mouth at bedtime as needed. for sleep 90 tablet 0  . zolpidem (AMBIEN) 10 MG tablet TAKE 1 TABLET (10 MG TOTAL) BY MOUTH AT BEDTIME AS NEEDED. FOR SLEEP 90 tablet 1   No facility-administered medications prior to visit.     No Known Allergies  Review of Systems  Constitutional: Negative for fever and malaise/fatigue.  HENT: Negative for congestion.   Eyes: Negative for blurred vision.  Respiratory: Negative for shortness of breath.   Cardiovascular: Negative for chest pain, palpitations and leg swelling.  Gastrointestinal: Negative for abdominal pain, blood in stool and nausea.  Genitourinary: Negative for dysuria and frequency.  Musculoskeletal: Negative for falls.  Skin: Negative for rash.  Neurological: Negative for dizziness, loss of consciousness and headaches.  Endo/Heme/Allergies: Negative for environmental allergies.  Psychiatric/Behavioral: Positive for depression. Negative for hallucinations, substance abuse and suicidal ideas. The patient is nervous/anxious and has insomnia.        Objective:    Physical Exam  Constitutional: She is oriented to person, place, and time. She appears well-developed and well-nourished. No distress.  HENT:  Head: Normocephalic and atraumatic.  Nose: Nose normal.  Eyes: Right eye exhibits no discharge. Left eye exhibits no discharge.  Neck: Normal range of motion. Neck supple.  Cardiovascular: Normal rate and regular rhythm.  No murmur heard. Pulmonary/Chest: Effort normal and breath sounds normal.  Abdominal: Soft. Bowel sounds are normal. There is no tenderness.  Musculoskeletal: She exhibits no edema.  Neurological: She is alert and oriented to person, place, and time.  Skin: Skin is warm and dry.  Psychiatric: She has a normal mood and affect.  Nursing note and vitals  reviewed.   BP 122/78 (BP Location: Left Arm, Patient Position: Sitting, Cuff Size: Normal)   Pulse 76   Temp 97.7 F (36.5 C) (Oral)   Resp 18   Wt 155 lb 9.6 oz (70.6 kg)   SpO2 96%   BMI 28.46 kg/m  Wt Readings from Last 3 Encounters:  08/05/18 155 lb 9.6 oz (70.6 kg)  01/28/18 143 lb 12.8 oz (65.2 kg)  01/19/17 137 lb 6.4 oz (62.3 kg)     Lab Results  Component Value Date  WBC 4.1 08/05/2018   HGB 14.6 08/05/2018   HCT 43.9 08/05/2018   PLT 209.0 08/05/2018   GLUCOSE 99 08/05/2018   CHOL 192 08/05/2018   TRIG 208.0 (H) 08/05/2018   HDL 54.80 08/05/2018   LDLDIRECT 113.0 08/05/2018   LDLCALC 127 (H) 01/28/2018   ALT 31 08/05/2018   AST 21 08/05/2018   NA 141 08/05/2018   K 4.5 08/05/2018   CL 102 08/05/2018   CREATININE 0.80 08/05/2018   BUN 14 08/05/2018   CO2 33 (H) 08/05/2018   TSH 1.55 08/05/2018   HGBA1C 5.8 08/05/2018   MICROALBUR <0.7 07/06/2015    Lab Results  Component Value Date   TSH 1.55 08/05/2018   Lab Results  Component Value Date   WBC 4.1 08/05/2018   HGB 14.6 08/05/2018   HCT 43.9 08/05/2018   MCV 88.4 08/05/2018   PLT 209.0 08/05/2018   Lab Results  Component Value Date   NA 141 08/05/2018   K 4.5 08/05/2018   CO2 33 (H) 08/05/2018   GLUCOSE 99 08/05/2018   BUN 14 08/05/2018   CREATININE 0.80 08/05/2018   BILITOT 0.4 08/05/2018   ALKPHOS 53 08/05/2018   AST 21 08/05/2018   ALT 31 08/05/2018   PROT 7.2 08/05/2018   ALBUMIN 4.8 08/05/2018   CALCIUM 9.9 08/05/2018   GFR 75.77 08/05/2018   Lab Results  Component Value Date   CHOL 192 08/05/2018   Lab Results  Component Value Date   HDL 54.80 08/05/2018   Lab Results  Component Value Date   LDLCALC 127 (H) 01/28/2018   Lab Results  Component Value Date   TRIG 208.0 (H) 08/05/2018   Lab Results  Component Value Date   CHOLHDL 4 08/05/2018   Lab Results  Component Value Date   HGBA1C 5.8 08/05/2018       Assessment & Plan:   Problem List Items  Addressed This Visit    Mixed hyperlipidemia    Tolerating statin, encouraged heart healthy diet, avoid trans fats, minimize simple carbs and saturated fats. Increase exercise as tolerated      ELEVATED BP READING WITHOUT DX HYPERTENSION   Relevant Medications   ALPRAZolam (XANAX) 1 MG tablet   Other Relevant Orders   TSH (Completed)   Insomnia    Encouraged good sleep hygiene such as dark, quiet room. No blue/green glowing lights such as computer screens in bedroom. No alcohol or stimulants in evening. Cut down on caffeine as able. Regular exercise is helpful but not just prior to bed time. Doing well on Ambien allowed refills      Depression with anxiety    She lost her son to drug addiction just 3 months.ago. She is undergoing counseling. Continue Alprazolam prn and consider SSRI but she declines for now. Counseled for 40 minutes of visit.       Relevant Medications   ALPRAZolam (XANAX) 1 MG tablet   Hyperglycemia    hgba1c acceptable, minimize simple carbs. Increase exercise as tolerated.      Relevant Medications   ALPRAZolam (XANAX) 1 MG tablet   Other Relevant Orders   Hemoglobin A1c (Completed)   Comprehensive metabolic panel (Completed)   TSH (Completed)   Osteopenia   Relevant Medications   ALPRAZolam (XANAX) 1 MG tablet   Abnormal hemoglobin (HCC)    Recheck CBC      Relevant Orders   CBC (Completed)    Other Visit Diagnoses    High risk medication use    -  Primary   Relevant Orders   Pain Mgmt, Profile 8 w/Conf, U (Completed)   Hyperlipidemia, mixed       Relevant Medications   ALPRAZolam (XANAX) 1 MG tablet   Other Relevant Orders   Lipid panel (Completed)   TSH (Completed)   Need for shingles vaccine       Relevant Medications   ALPRAZolam (XANAX) 1 MG tablet      I am having Braelynne H. Mcgillivray start on pantoprazole. I am also having her maintain her fish oil-omega-3 fatty acids, Biotin, amoxicillin, lovastatin, ALPRAZolam, and zolpidem.  Meds  ordered this encounter  Medications  . ALPRAZolam (XANAX) 1 MG tablet    Sig: Take 1 tablet (1 mg total) by mouth 2 (two) times daily as needed for anxiety.    Dispense:  180 tablet    Refill:  1    Not to exceed 5 additional fills before 07/24/2018 DX Code Needed  .  . zolpidem (AMBIEN) 10 MG tablet    Sig: Take 1 tablet (10 mg total) by mouth at bedtime as needed. for sleep    Dispense:  90 tablet    Refill:  0    This request is for a new prescription for a controlled substance as required by Federal/State law..  . pantoprazole (PROTONIX) 40 MG tablet    Sig: Take 1 tablet (40 mg total) by mouth daily as needed.    Dispense:  90 tablet    Refill:  1     Penni Homans, MD

## 2018-08-26 ENCOUNTER — Telehealth: Payer: Self-pay

## 2018-08-26 NOTE — Telephone Encounter (Signed)
Copied from [place] [phone]. Topic: Quick Communication - See Telephone Encounter >> Aug 25, 2018  9:45 AM Vernona Rieger wrote: CRM for notification. See Telephone encounter for: 08/25/18.  [employer] called for the patient and states that the patient had inquired about a shingle vaccine with Dr Charlett Blake. She is wanting to know what the codings are so she can find out if it is covered with her plan or not. Contact is [phone] Jarrett Soho )

## 2018-08-26 NOTE — Telephone Encounter (Signed)
Routed to Engineer, building services and [facility] to investigate coding for shingrix for pt.

## 2018-08-26 NOTE — Telephone Encounter (Signed)
Those are correct codes and pricing as of today.  Thanks [employer]

## 2018-08-31 NOTE — Telephone Encounter (Signed)
Author phoned pt., and relayed information. Pt. Appreciative.

## 2018-08-31 NOTE — Telephone Encounter (Signed)
Let's start her on Sertraline 50 mg tab, 1/2 tab po daily x 7 days then increase to a full tab. Disp #30 with 3 rf. If she has any trouble with med such as upset stomach it generally gets better in a week or two. She should come in sooner than January if she needs to see me.

## 2018-08-31 NOTE — Telephone Encounter (Signed)
During phone coversation, pt. Expressed interest in starting an anti-depressant per previous conversation with Dr. Charlett Blake, as "the situation is not getting any better". Routed to Dr. Charlett Blake to advise.

## 2018-09-01 MED ORDER — SERTRALINE HCL 50 MG PO TABS: 50.0000 mg | ORAL_TABLET | Freq: Every day | ORAL | 3 refills | Status: DC

## 2018-09-01 NOTE — Telephone Encounter (Signed)
Spoke w/ Pt- informed of recommendations. Pt verbalized understanding. Rx sent. Pt has appt in January already scheduled.

## 2018-09-23 ENCOUNTER — Other Ambulatory Visit: Payer: Self-pay | Admitting: Family Medicine

## 2018-09-28 DIAGNOSIS — H35372 Puckering of macula, left eye: Secondary | ICD-10-CM | POA: Insufficient documentation

## 2018-11-12 ENCOUNTER — Ambulatory Visit: Payer: BLUE CROSS/BLUE SHIELD | Admitting: Family Medicine

## 2019-01-06 ENCOUNTER — Other Ambulatory Visit: Payer: Self-pay | Admitting: Family Medicine

## 2019-01-13 ENCOUNTER — Other Ambulatory Visit: Payer: Self-pay | Admitting: Family Medicine

## 2019-01-20 ENCOUNTER — Other Ambulatory Visit: Payer: Self-pay | Admitting: Family Medicine

## 2019-01-20 MED ORDER — ZOLPIDEM TARTRATE 10 MG PO TABS: 10.0000 mg | ORAL_TABLET | Freq: Every evening | ORAL | 0 refills | Status: DC | PRN

## 2019-01-20 NOTE — Telephone Encounter (Signed)
Requested medication (s) are due for refill today: {Yes  Requested medication (s) are on the active medication list: yes    Last refill: 08/05/2018  #90   0 refills  Future visit scheduled yes 02/03/2019  Notes to clinic:not delegated  Requested Prescriptions  Pending Prescriptions Disp Refills   zolpidem (AMBIEN) 10 MG tablet 90 tablet 0    Sig: Take 1 tablet (10 mg total) by mouth at bedtime as needed. for sleep     Not Delegated - Psychiatry:  Anxiolytics/Hypnotics Failed - 01/20/2019 12:35 PM      Failed - This refill cannot be delegated      Passed - Urine Drug Screen completed in last 360 days.      Passed - Valid encounter within last 6 months    Recent Outpatient Visits          5 months ago High risk medication use   [facility] [place], MD   11 months ago Preventative health care   [facility] [place], MD   2 years ago Preventative health care   [facility] [place], MD   2 years ago Mixed hyperlipidemia   [facility] [place], MD   3 years ago [facility] [place], MD      Future Appointments            In 2 weeks Mosie Lukes, MD [facility] [place]

## 2019-01-27 ENCOUNTER — Other Ambulatory Visit: Payer: Self-pay | Admitting: Family Medicine

## 2019-02-03 ENCOUNTER — Encounter: Payer: BLUE CROSS/BLUE SHIELD | Admitting: Family Medicine

## 2019-02-28 ENCOUNTER — Ambulatory Visit (INDEPENDENT_AMBULATORY_CARE_PROVIDER_SITE_OTHER): Payer: BLUE CROSS/BLUE SHIELD | Admitting: Family Medicine

## 2019-02-28 ENCOUNTER — Other Ambulatory Visit: Payer: Self-pay

## 2019-02-28 DIAGNOSIS — R030 Elevated blood-pressure reading, without diagnosis of hypertension: Secondary | ICD-10-CM | POA: Diagnosis not present

## 2019-02-28 DIAGNOSIS — D582 Other hemoglobinopathies: Secondary | ICD-10-CM

## 2019-02-28 DIAGNOSIS — F418 Other specified anxiety disorders: Secondary | ICD-10-CM

## 2019-02-28 DIAGNOSIS — E782 Mixed hyperlipidemia: Secondary | ICD-10-CM

## 2019-02-28 DIAGNOSIS — M8580 Other specified disorders of bone density and structure, unspecified site: Secondary | ICD-10-CM | POA: Diagnosis not present

## 2019-02-28 DIAGNOSIS — R0789 Other chest pain: Secondary | ICD-10-CM

## 2019-02-28 DIAGNOSIS — R739 Hyperglycemia, unspecified: Secondary | ICD-10-CM

## 2019-02-28 DIAGNOSIS — Z23 Encounter for immunization: Secondary | ICD-10-CM

## 2019-02-28 MED ORDER — ALPRAZOLAM 1 MG PO TABS: 1.0000 mg | ORAL_TABLET | Freq: Two times a day (BID) | ORAL | 1 refills | Status: DC | PRN

## 2019-02-28 MED ORDER — ZOLPIDEM TARTRATE 10 MG PO TABS: 10.0000 mg | ORAL_TABLET | Freq: Every evening | ORAL | 1 refills | Status: DC | PRN

## 2019-02-28 MED ORDER — NITROGLYCERIN 0.4 MG SL SUBL: 0.4000 mg | SUBLINGUAL_TABLET | SUBLINGUAL | 1 refills | Status: AC | PRN

## 2019-03-02 NOTE — Assessment & Plan Note (Signed)
Encouraged heart healthy diet, increase exercise, avoid trans fats, consider a krill oil cap daily 

## 2019-03-02 NOTE — Assessment & Plan Note (Signed)
>>  ASSESSMENT AND PLAN FOR MIXED DIABETIC HYPERLIPIDEMIA ASSOCIATED WITH TYPE 2 DIABETES MELLITUS (HCC) WRITTEN ON 03/02/2019  9:55 PM BY BLYTH, STACEY A, MD  hgba1c acceptable, minimize simple carbs. Increase exercise as tolerated.

## 2019-03-02 NOTE — Progress Notes (Signed)
Virtual Visit via Video Note  I connected with Tricia Fleming on 03/02/19 at  9:40 AM EDT by a video enabled telemedicine application and verified that I am speaking with the correct person using two identifiers.  Location: Patient: home Provider: office   I discussed the limitations of evaluation and management by telemedicine and the availability of in person appointments. The patient expressed understanding and agreed to proceed. [name], CMA was able to get patient set up on video platform    Subjective:    Patient ID: Tricia Fleming, female    DOB: [birth year], 69 y.o.   MRN: [id number]  No chief complaint on file.   HPI Patient is in today for follow up on chronic medical concerns including anxiety, hyperlipidemia, abnormal hemoglobin, and more. She is manaign her quarantine due to the pandemic well. Her anxiety is still present but manageable. No recent febrile illness or hospitalizations. Denies CP/palp/SOB/HA/congestion/fevers/GI or GU c/o. Taking meds as prescribed  Past Medical History:  Diagnosis Date  . Anxiety   . Atypical chest pain 12/08/2016  . Breast lesion 06/18/2010   Qualifier: Diagnosis of  By: Charlett Blake MD, Erline Levine    . Depression   . Depression with anxiety 06/27/2011  . Hyperglycemia 07/27/2013  . Hyperlipidemia   . Osteopenia 01/15/2016  . Preventative health care 06/27/2011  . Preventative health care 06/27/2011  . SCC (squamous cell carcinoma), leg 05/04/2014   SCC   . Shoulder pain, bilateral 02/13/2013    Past Surgical History:  Procedure Laterality Date  . BREAST BIOPSY Left    needle core biopsy, benign  . TUBAL LIGATION      Family History  Problem Relation Age of Onset  . Cancer Father        metastic prostate  . Diabetes Mother   . Hypertension Mother   . Coronary artery disease Mother   . Transient ischemic attack Sister   . Heart disease Brother   . Benign prostatic hyperplasia Brother   . Lung disease Brother        in a smoker   . Insulin resistance Brother   . Hypertension Sister   . Benign prostatic hyperplasia Brother   . Alcohol abuse Brother   . Arthritis Sister   . Diabetes Maternal Grandmother   . Coronary artery disease Maternal Grandmother   . Diabetes Maternal Grandfather   . Esophageal varices Brother   . Cirrhosis Brother   . Cancer Maternal Aunt   . Birth defects Paternal Aunt        breast    Social History   Socioeconomic History  . Marital status: Married    Spouse name: Not on file  . Number of children: Not on file  . Years of education: Not on file  . Highest education level: Not on file  Occupational History  . Not on file  Social Needs  . Financial resource strain: Not on file  . Food insecurity:    Worry: Not on file    Inability: Not on file  . Transportation needs:    Medical: Not on file    Non-medical: Not on file  Tobacco Use  . Smoking status: Never Smoker  . Smokeless tobacco: Never Used  Substance and Sexual Activity  . Alcohol use: Yes    Alcohol/week: 0.0 standard drinks    Comment: a couple beers a week  . Drug use: No  . Sexual activity: Yes    Partners: Male  Lifestyle  . Physical  activity:    Days per week: Not on file    Minutes per session: Not on file  . Stress: Not on file  Relationships  . Social connections:    Talks on phone: Not on file    Gets together: Not on file    Attends religious service: Not on file    Active member of club or organization: Not on file    Attends meetings of clubs or organizations: Not on file    Relationship status: Not on file  . Intimate partner violence:    Fear of current or ex partner: Not on file    Emotionally abused: Not on file    Physically abused: Not on file    Forced sexual activity: Not on file  Other Topics Concern  . Not on file  Social History Narrative  . Not on file    Outpatient Medications Prior to Visit  Medication Sig Dispense Refill  . Biotin 5000 MCG CAPS Take by mouth.    .  fish oil-omega-3 fatty acids 1000 MG capsule Take 1,000 mg by mouth daily. 2 caps     . lovastatin (MEVACOR) 40 MG tablet TAKE 1 TABLET (40 MG TOTAL) BY MOUTH AT BEDTIME. 90 tablet 2  . pantoprazole (PROTONIX) 40 MG tablet TAKE 1 TABLET BY MOUTH DAILY AS NEEDED 90 tablet 1  . sertraline (ZOLOFT) 50 MG tablet TAKE 1/2 TABLET BY MOUTH DAILY FOR THE FIRST 7 DAYS, THEN 1 TAB DAILY THEREAFTER 90 tablet 2  . ALPRAZolam (XANAX) 1 MG tablet Take 1 tablet (1 mg total) by mouth 2 (two) times daily as needed for anxiety. 180 tablet 1  . amoxicillin (AMOXIL) 500 MG capsule Take 1 capsule (500 mg total) by mouth 3 (three) times daily. 30 capsule 0  . zolpidem (AMBIEN) 10 MG tablet Take 1 tablet (10 mg total) by mouth at bedtime as needed. for sleep 90 tablet 0   No facility-administered medications prior to visit.     No Known Allergies  Review of Systems  Constitutional: Negative for fever and malaise/fatigue.  HENT: Negative for congestion.   Eyes: Negative for blurred vision.  Respiratory: Negative for shortness of breath.   Cardiovascular: Positive for chest pain. Negative for palpitations and leg swelling.  Gastrointestinal: Positive for abdominal pain. Negative for blood in stool and nausea.  Genitourinary: Negative for dysuria and frequency.  Musculoskeletal: Negative for falls.  Skin: Negative for rash.  Neurological: Negative for dizziness, loss of consciousness and headaches.  Endo/Heme/Allergies: Negative for environmental allergies.  Psychiatric/Behavioral: Negative for depression. The patient is nervous/anxious.        Objective:    Physical Exam Constitutional:      Appearance: Normal appearance. She is not ill-appearing.  HENT:     Head: Normocephalic and atraumatic.     Nose: Nose normal.  Pulmonary:     Effort: Pulmonary effort is normal.     Breath sounds: Normal breath sounds.  Neurological:     Mental Status: She is alert and oriented to person, place, and time.   Psychiatric:        Mood and Affect: Mood normal.        Behavior: Behavior normal.     There were no vitals taken for this visit. Wt Readings from Last 3 Encounters:  08/05/18 155 lb 9.6 oz (70.6 kg)  01/28/18 143 lb 12.8 oz (65.2 kg)  01/19/17 137 lb 6.4 oz (62.3 kg)    Diabetic Foot Exam - Simple  No data filed     Lab Results  Component Value Date   WBC 4.1 08/05/2018   HGB 14.6 08/05/2018   HCT 43.9 08/05/2018   PLT 209.0 08/05/2018   GLUCOSE 99 08/05/2018   CHOL 192 08/05/2018   TRIG 208.0 (H) 08/05/2018   HDL 54.80 08/05/2018   LDLDIRECT 113.0 08/05/2018   LDLCALC 127 (H) 01/28/2018   ALT 31 08/05/2018   AST 21 08/05/2018   NA 141 08/05/2018   K 4.5 08/05/2018   CL 102 08/05/2018   CREATININE 0.80 08/05/2018   BUN 14 08/05/2018   CO2 33 (H) 08/05/2018   TSH 1.55 08/05/2018   HGBA1C 5.8 08/05/2018   MICROALBUR <0.7 07/06/2015    Lab Results  Component Value Date   TSH 1.55 08/05/2018   Lab Results  Component Value Date   WBC 4.1 08/05/2018   HGB 14.6 08/05/2018   HCT 43.9 08/05/2018   MCV 88.4 08/05/2018   PLT 209.0 08/05/2018   Lab Results  Component Value Date   NA 141 08/05/2018   K 4.5 08/05/2018   CO2 33 (H) 08/05/2018   GLUCOSE 99 08/05/2018   BUN 14 08/05/2018   CREATININE 0.80 08/05/2018   BILITOT 0.4 08/05/2018   ALKPHOS 53 08/05/2018   AST 21 08/05/2018   ALT 31 08/05/2018   PROT 7.2 08/05/2018   ALBUMIN 4.8 08/05/2018   CALCIUM 9.9 08/05/2018   GFR 75.77 08/05/2018   Lab Results  Component Value Date   CHOL 192 08/05/2018   Lab Results  Component Value Date   HDL 54.80 08/05/2018   Lab Results  Component Value Date   LDLCALC 127 (H) 01/28/2018   Lab Results  Component Value Date   TRIG 208.0 (H) 08/05/2018   Lab Results  Component Value Date   CHOLHDL 4 08/05/2018   Lab Results  Component Value Date   HGBA1C 5.8 08/05/2018       Assessment & Plan:   Problem List Items Addressed This Visit    Mixed  hyperlipidemia    Encouraged heart healthy diet, increase exercise, avoid trans fats, consider a krill oil cap daily      Relevant Medications   nitroGLYCERIN (NITROSTAT) 0.4 MG SL tablet   ELEVATED BP READING WITHOUT DX HYPERTENSION   Relevant Medications   ALPRAZolam (XANAX) 1 MG tablet   Depression with anxiety    Doing well on current meds no changes       Relevant Medications   ALPRAZolam (XANAX) 1 MG tablet   Hyperglycemia    hgba1c acceptable, minimize simple carbs. Increase exercise as tolerated.       Relevant Medications   ALPRAZolam (XANAX) 1 MG tablet   Osteopenia   Relevant Medications   ALPRAZolam (XANAX) 1 MG tablet   Atypical chest pain    She is feeling well but does attest to occasional epigastrium discomfort which in the past she notes has occurred before and it responded to SL NTG. She is allowed a refill and is asked to consider further evaluations. She will let us know if symptoms worsen and/or she decides she is willing to consider further work up she will let us know. Avoid any offending foods       Abnormal hemoglobin (HCC)    Check CBC with next blood draw to monitor       Other Visit Diagnoses    Hyperlipidemia, mixed       Relevant Medications   ALPRAZolam (XANAX) 1 MG tablet  nitroGLYCERIN (NITROSTAT) 0.4 MG SL tablet   Need for shingles vaccine       Relevant Medications   ALPRAZolam (XANAX) 1 MG tablet      I have discontinued Nancie Neas. Payment's amoxicillin. I am also having her maintain her fish oil-omega-3 fatty acids, Biotin, lovastatin, sertraline, pantoprazole, ALPRAZolam, zolpidem, and nitroGLYCERIN.  Meds ordered this encounter  Medications  . ALPRAZolam (XANAX) 1 MG tablet    Sig: Take 1 tablet (1 mg total) by mouth 2 (two) times daily as needed for anxiety.    Dispense:  180 tablet    Refill:  1    Not to exceed 5 additional fills before 07/24/2018 DX Code Needed  .  . zolpidem (AMBIEN) 10 MG tablet    Sig: Take 1 tablet  (10 mg total) by mouth at bedtime as needed. for sleep    Dispense:  90 tablet    Refill:  1    This request is for a new prescription for a controlled substance as required by Federal/State law..  . nitroGLYCERIN (NITROSTAT) 0.4 MG SL tablet    Sig: Place 1 tablet (0.4 mg total) under the tongue every 5 (five) minutes as needed for chest pain.    Dispense:  25 tablet    Refill:  1    I discussed the assessment and treatment plan with the patient. The patient was provided an opportunity to ask questions and all were answered. The patient agreed with the plan and demonstrated an understanding of the instructions.   The patient was advised to call back or seek an in-person evaluation if the symptoms worsen or if the condition fails to improve as anticipated.  I provided 25 minutes of non-face-to-face time during this encounter.   Penni Homans, MD

## 2019-03-02 NOTE — Assessment & Plan Note (Signed)
Doing well on current meds no changes 

## 2019-03-02 NOTE — Assessment & Plan Note (Signed)
She is feeling well but does attest to occasional epigastrium discomfort which in the past she notes has occurred before and it responded to SL NTG. She is allowed a refill and is asked to consider further evaluations. She will let us know if symptoms worsen and/or she decides she is willing to consider further work up she will let us know. Avoid any offending foods

## 2019-03-02 NOTE — Assessment & Plan Note (Signed)
hgba1c acceptable, minimize simple carbs. Increase exercise as tolerated.  

## 2019-03-02 NOTE — Assessment & Plan Note (Signed)
Check CBC with next blood draw to monitor

## 2019-05-31 ENCOUNTER — Ambulatory Visit (HOSPITAL_BASED_OUTPATIENT_CLINIC_OR_DEPARTMENT_OTHER)
Admission: RE | Admit: 2019-05-31 | Discharge: 2019-05-31 | Disposition: A | Discharge status: Home / Self Care | Payer: Medicare HMO | Source: Ambulatory Visit | Attending: Family Medicine | Admitting: Family Medicine

## 2019-05-31 ENCOUNTER — Ambulatory Visit (INDEPENDENT_AMBULATORY_CARE_PROVIDER_SITE_OTHER): Payer: Medicare HMO | Admitting: Family Medicine

## 2019-05-31 ENCOUNTER — Other Ambulatory Visit: Payer: Self-pay

## 2019-05-31 ENCOUNTER — Encounter: Payer: Self-pay | Admitting: Family Medicine

## 2019-05-31 ENCOUNTER — Other Ambulatory Visit: Payer: Self-pay | Admitting: Family Medicine

## 2019-05-31 ENCOUNTER — Encounter (HOSPITAL_BASED_OUTPATIENT_CLINIC_OR_DEPARTMENT_OTHER): Payer: Self-pay

## 2019-05-31 VITALS — BP 130/82 | HR 99 | Temp 98.2°F | Resp 18 | Wt 156.8 lb

## 2019-05-31 DIAGNOSIS — R739 Hyperglycemia, unspecified: Secondary | ICD-10-CM

## 2019-05-31 DIAGNOSIS — Z1239 Encounter for other screening for malignant neoplasm of breast: Secondary | ICD-10-CM

## 2019-05-31 DIAGNOSIS — Z1231 Encounter for screening mammogram for malignant neoplasm of breast: Secondary | ICD-10-CM | POA: Insufficient documentation

## 2019-05-31 DIAGNOSIS — R030 Elevated blood-pressure reading, without diagnosis of hypertension: Secondary | ICD-10-CM

## 2019-05-31 DIAGNOSIS — E782 Mixed hyperlipidemia: Secondary | ICD-10-CM | POA: Diagnosis not present

## 2019-05-31 DIAGNOSIS — R69 Illness, unspecified: Secondary | ICD-10-CM | POA: Diagnosis not present

## 2019-05-31 DIAGNOSIS — F418 Other specified anxiety disorders: Secondary | ICD-10-CM

## 2019-05-31 LAB — COMPREHENSIVE METABOLIC PANEL
ALT: 35 U/L (ref 0–35)
AST: 26 U/L (ref 0–37)
Albumin: 4.8 g/dL (ref 3.5–5.2)
Alkaline Phosphatase: 59 U/L (ref 39–117)
BUN: 22 mg/dL (ref 6–23)
CO2: 33 mEq/L — ABNORMAL HIGH (ref 19–32)
Calcium: 10 mg/dL (ref 8.4–10.5)
Chloride: 101 mEq/L (ref 96–112)
Creatinine, Ser: 0.8 mg/dL (ref 0.40–1.20)
GFR: 71.11 mL/min (ref >60.00)
Glucose, Bld: 98 mg/dL (ref 70–99)
Potassium: 5.2 mEq/L — ABNORMAL HIGH (ref 3.5–5.1)
Sodium: 142 mEq/L (ref 135–145)
Total Bilirubin: 0.4 mg/dL (ref 0.2–1.2)
Total Protein: 7.1 g/dL (ref 6.0–8.3)

## 2019-05-31 LAB — HEMOGLOBIN A1C: Hgb A1c MFr Bld: 6 % (ref 4.6–6.5)

## 2019-05-31 LAB — LIPID PANEL
Cholesterol: 211 mg/dL — ABNORMAL HIGH (ref 0–200)
HDL: 51.9 mg/dL (ref >39.00)
NonHDL: 159.37
Total CHOL/HDL Ratio: 4
Triglycerides: 239 mg/dL — ABNORMAL HIGH (ref 0.0–149.0)
VLDL: 47.8 mg/dL — ABNORMAL HIGH (ref 0.0–40.0)

## 2019-05-31 LAB — CBC
HCT: 43.3 % (ref 36.0–46.0)
Hemoglobin: 14.5 g/dL (ref 12.0–15.0)
MCHC: 33.5 g/dL (ref 30.0–36.0)
MCV: 87.7 fl (ref 78.0–100.0)
Platelets: 194 10*3/uL (ref 150.0–400.0)
RBC: 4.94 Mil/uL (ref 3.87–5.11)
RDW: 13.3 % (ref 11.5–15.5)
WBC: 4.1 10*3/uL (ref 4.0–10.5)

## 2019-05-31 LAB — TSH: TSH: 1.53 u[IU]/mL (ref 0.35–4.50)

## 2019-05-31 LAB — LDL CHOLESTEROL, DIRECT: Direct LDL: 125 mg/dL

## 2019-05-31 MED ORDER — SERTRALINE HCL 50 MG PO TABS: 25.0000 mg | ORAL_TABLET | Freq: Every day | ORAL | 2 refills | Status: DC

## 2019-05-31 NOTE — Patient Instructions (Addendum)
Good Rx or CoverMyMeds for help with medication costs.   [facility] Blood pressure upper arm Pulse Oximeter   Check vitals weekly   Insomnia Insomnia is a sleep disorder that makes it difficult to fall asleep or stay asleep. Insomnia can cause fatigue, low energy, difficulty concentrating, mood swings, and poor performance at work or school. There are three different ways to classify insomnia:  Difficulty falling asleep.  Difficulty staying asleep.  Waking up too early in the morning. Any type of insomnia can be long-term (chronic) or short-term (acute). Both are common. Short-term insomnia usually lasts for three months or less. Chronic insomnia occurs at least three times a week for longer than three months. What are the causes? Insomnia may be caused by another condition, situation, or substance, such as:  Anxiety.  Certain medicines.  Gastroesophageal reflux disease (GERD) or other gastrointestinal conditions.  Asthma or other breathing conditions.  Restless legs syndrome, sleep apnea, or other sleep disorders.  Chronic pain.  Menopause.  Stroke.  Abuse of alcohol, tobacco, or illegal drugs.  Mental health conditions, such as depression.  Caffeine.  Neurological disorders, such as Alzheimer's disease.  An overactive thyroid (hyperthyroidism). Sometimes, the cause of insomnia may not be known. What increases the risk? Risk factors for insomnia include:  Gender. Women are affected more often than men.  Age. Insomnia is more common as you get older.  Stress.  Lack of exercise.  Irregular work schedule or working night shifts.  Traveling between different time zones.  Certain medical and mental health conditions. What are the signs or symptoms? If you have insomnia, the main symptom is having trouble falling asleep or having trouble staying asleep. This may lead to other symptoms, such as:  Feeling fatigued or having low energy.  Feeling nervous about  going to sleep.  Not feeling rested in the morning.  Having trouble concentrating.  Feeling irritable, anxious, or depressed. How is this diagnosed? This condition may be diagnosed based on:  Your symptoms and medical history. Your health care provider may ask about: ? Your sleep habits. ? Any medical conditions you have. ? Your mental health.  A physical exam. How is this treated? Treatment for insomnia depends on the cause. Treatment may focus on treating an underlying condition that is causing insomnia. Treatment may also include:  Medicines to help you sleep.  Counseling or therapy.  Lifestyle adjustments to help you sleep better. Follow these instructions at home: Eating and drinking   Limit or avoid alcohol, caffeinated beverages, and cigarettes, especially close to bedtime. These can disrupt your sleep.  Do not eat a large meal or eat spicy foods right before bedtime. This can lead to digestive discomfort that can make it hard for you to sleep. Sleep habits   Keep a sleep diary to help you and your health care provider figure out what could be causing your insomnia. Write down: ? When you sleep. ? When you wake up during the night. ? How well you sleep. ? How rested you feel the next day. ? Any side effects of medicines you are taking. ? What you eat and drink.  Make your bedroom a dark, comfortable place where it is easy to fall asleep. ? Put up shades or blackout curtains to block light from outside. ? Use a white noise machine to block noise. ? Keep the temperature cool.  Limit screen use before bedtime. This includes: ? Watching TV. ? Using your smartphone, tablet, or computer.  Stick to a  routine that includes going to bed and waking up at the same times every day and night. This can help you fall asleep faster. Consider making a quiet activity, such as reading, part of your nighttime routine.  Try to avoid taking naps during the day so that you sleep  better at night.  Get out of bed if you are still awake after 15 minutes of trying to sleep. Keep the lights down, but try reading or doing a quiet activity. When you feel sleepy, go back to bed. General instructions  Take over-the-counter and prescription medicines only as told by your health care provider.  Exercise regularly, as told by your health care provider. Avoid exercise starting several hours before bedtime.  Use relaxation techniques to manage stress. Ask your health care provider to suggest some techniques that may work well for you. These may include: ? Breathing exercises. ? Routines to release muscle tension. ? Visualizing peaceful scenes.  Make sure that you drive carefully. Avoid driving if you feel very sleepy.  Keep all follow-up visits as told by your health care provider. This is important. Contact a health care provider if:  You are tired throughout the day.  You have trouble in your daily routine due to sleepiness.  You continue to have sleep problems, or your sleep problems get worse. Get help right away if:  You have serious thoughts about hurting yourself or someone else. If you ever feel like you may hurt yourself or others, or have thoughts about taking your own life, get help right away. You can go to your nearest emergency department or call:  Your local emergency services (911 in the [place]  A suicide crisis helpline, such as the [facility] at 857-098-6737. This is open 24 hours a day. Summary  Insomnia is a sleep disorder that makes it difficult to fall asleep or stay asleep.  Insomnia can be long-term (chronic) or short-term (acute).  Treatment for insomnia depends on the cause. Treatment may focus on treating an underlying condition that is causing insomnia.  Keep a sleep diary to help you and your health care provider figure out what could be causing your insomnia. This information is not intended to replace  advice given to you by your health care provider. Make sure you discuss any questions you have with your health care provider. Document Released: 10/10/2000 Document Revised: 09/25/2017 Document Reviewed: 07/23/2017 Elsevier Patient Education  2020 [employer].

## 2019-06-01 NOTE — Assessment & Plan Note (Signed)
>>  ASSESSMENT AND PLAN FOR MIXED DIABETIC HYPERLIPIDEMIA ASSOCIATED WITH TYPE 2 DIABETES MELLITUS ([facility]) WRITTEN ON 06/01/2019  9:46 PM BY BLYTH, STACEY A, MD  hgba1c acceptable, minimize simple carbs. Increase exercise as tolerated.

## 2019-06-01 NOTE — Assessment & Plan Note (Signed)
July is tough as that is the month she was widowed. We have prescribed Sertraline previously but she has always been hesitant and not started it. She now agrees to start it and a refill is sent to pharmacy. She may continue Alprazolam prn. She was counseled 20 minutes of a 25 minute visit.

## 2019-06-01 NOTE — Assessment & Plan Note (Signed)
hgba1c acceptable, minimize simple carbs. Increase exercise as tolerated.  

## 2019-06-01 NOTE — Progress Notes (Signed)
Subjective:    Patient ID: Tricia Fleming, female    DOB: 1951/04/09, 68 y.o.   MRN: [id number]  No chief complaint on file.   HPI Patient is in today for follow up on chronic medical concerns including hyperglycemia, depression, anxiety and hyperlipidemia. She always struggles in July due to the loss of her husband but she is feeling worse than usual. Very tearful but no suicidal ideation. We have attempted to start Sertraline in the past but she has always decided not to take it. She now thinks she is ready. No recent febrile illness or hospitalizations. No polyuria or polydipsia.  Past Medical History:  Diagnosis Date  . Anxiety   . Atypical chest pain 12/08/2016  . Breast lesion 06/18/2010   Qualifier: Diagnosis of  By: Charlett Blake MD, Erline Levine    . Depression   . Depression with anxiety 06/27/2011  . Hyperglycemia 07/27/2013  . Hyperlipidemia   . Osteopenia 01/15/2016  . Preventative health care 06/27/2011  . Preventative health care 06/27/2011  . SCC (squamous cell carcinoma), leg 05/04/2014   SCC   . Shoulder pain, bilateral 02/13/2013    Past Surgical History:  Procedure Laterality Date  . BREAST BIOPSY Left    needle core biopsy, benign  . TUBAL LIGATION      Family History  Problem Relation Age of Onset  . Cancer Father        metastic prostate  . Diabetes Mother   . Hypertension Mother   . Coronary artery disease Mother   . Transient ischemic attack Sister   . Heart disease Brother   . Benign prostatic hyperplasia Brother   . Lung disease Brother        in a smoker  . Insulin resistance Brother   . Hypertension Sister   . Benign prostatic hyperplasia Brother   . Alcohol abuse Brother   . Arthritis Sister   . Diabetes Maternal Grandmother   . Coronary artery disease Maternal Grandmother   . Diabetes Maternal Grandfather   . Esophageal varices Brother   . Cirrhosis Brother   . Cancer Maternal Aunt   . Birth defects Paternal Aunt        breast    Social History    Socioeconomic History  . Marital status: Married    Spouse name: Not on file  . Number of children: Not on file  . Years of education: Not on file  . Highest education level: Not on file  Occupational History  . Not on file  Social Needs  . Financial resource strain: Not on file  . Food insecurity    Worry: Not on file    Inability: Not on file  . Transportation needs    Medical: Not on file    Non-medical: Not on file  Tobacco Use  . Smoking status: Never Smoker  . Smokeless tobacco: Never Used  Substance and Sexual Activity  . Alcohol use: Yes    Alcohol/week: 0.0 standard drinks    Comment: a couple beers a week  . Drug use: No  . Sexual activity: Yes    Partners: Male  Lifestyle  . Physical activity    Days per week: Not on file    Minutes per session: Not on file  . Stress: Not on file  Relationships  . Social Herbalist on phone: Not on file    Gets together: Not on file    Attends religious service: Not on file  Active member of club or organization: Not on file    Attends meetings of clubs or organizations: Not on file    Relationship status: Not on file  . Intimate partner violence    Fear of current or ex partner: Not on file    Emotionally abused: Not on file    Physically abused: Not on file    Forced sexual activity: Not on file  Other Topics Concern  . Not on file  Social History Narrative  . Not on file    Outpatient Medications Prior to Visit  Medication Sig Dispense Refill  . ALPRAZolam (XANAX) 1 MG tablet Take 1 tablet (1 mg total) by mouth 2 (two) times daily as needed for anxiety. 180 tablet 1  . Biotin 5000 MCG CAPS Take by mouth.    . fish oil-omega-3 fatty acids 1000 MG capsule Take 1,000 mg by mouth daily. 2 caps     . lovastatin (MEVACOR) 40 MG tablet TAKE 1 TABLET (40 MG TOTAL) BY MOUTH AT BEDTIME. 90 tablet 2  . nitroGLYCERIN (NITROSTAT) 0.4 MG SL tablet Place 1 tablet (0.4 mg total) under the tongue every 5 (five)  minutes as needed for chest pain. 25 tablet 1  . pantoprazole (PROTONIX) 40 MG tablet TAKE 1 TABLET BY MOUTH DAILY AS NEEDED 90 tablet 1  . zolpidem (AMBIEN) 10 MG tablet Take 1 tablet (10 mg total) by mouth at bedtime as needed. for sleep 90 tablet 1  . sertraline (ZOLOFT) 50 MG tablet TAKE 1/2 TABLET BY MOUTH DAILY FOR THE FIRST 7 DAYS, THEN 1 TAB DAILY THEREAFTER 90 tablet 2   No facility-administered medications prior to visit.     No Known Allergies  Review of Systems  Constitutional: Positive for malaise/fatigue. Negative for fever.  HENT: Negative for congestion.   Eyes: Negative for blurred vision.  Respiratory: Negative for shortness of breath.   Cardiovascular: Negative for chest pain, palpitations and leg swelling.  Gastrointestinal: Negative for abdominal pain, blood in stool and nausea.  Genitourinary: Negative for dysuria and frequency.  Musculoskeletal: Negative for falls.  Skin: Negative for rash.  Neurological: Negative for dizziness, loss of consciousness and headaches.  Endo/Heme/Allergies: Negative for environmental allergies.  Psychiatric/Behavioral: Positive for depression. Negative for suicidal ideas. The patient is nervous/anxious.        Objective:    Physical Exam Vitals signs and nursing note reviewed.  Constitutional:      General: She is not in acute distress.    Appearance: She is well-developed.  HENT:     Head: Normocephalic and atraumatic.     Nose: Nose normal.  Eyes:     General:        Right eye: No discharge.        Left eye: No discharge.  Neck:     Musculoskeletal: Normal range of motion and neck supple.  Cardiovascular:     Rate and Rhythm: Normal rate and regular rhythm.     Heart sounds: No murmur.  Pulmonary:     Effort: Pulmonary effort is normal.     Breath sounds: Normal breath sounds.  Abdominal:     General: Bowel sounds are normal.     Palpations: Abdomen is soft.     Tenderness: There is no abdominal tenderness.   Skin:    General: Skin is warm and dry.  Neurological:     Mental Status: She is alert and oriented to person, place, and time.     BP 130/82 (BP Location:  Left Arm, Patient Position: Sitting, Cuff Size: Normal)   Pulse 99   Temp 98.2 F (36.8 C) (Oral)   Resp 18   Wt 156 lb 12.8 oz (71.1 kg)   SpO2 99%   BMI 28.68 kg/m  Wt Readings from Last 3 Encounters:  05/31/19 156 lb 12.8 oz (71.1 kg)  08/05/18 155 lb 9.6 oz (70.6 kg)  01/28/18 143 lb 12.8 oz (65.2 kg)    Diabetic Foot Exam - Simple   No data filed     Lab Results  Component Value Date   WBC 4.1 05/31/2019   HGB 14.5 05/31/2019   HCT 43.3 05/31/2019   PLT 194.0 05/31/2019   GLUCOSE 98 05/31/2019   CHOL 211 (H) 05/31/2019   TRIG 239.0 (H) 05/31/2019   HDL 51.90 05/31/2019   LDLDIRECT 125.0 05/31/2019   LDLCALC 127 (H) 01/28/2018   ALT 35 05/31/2019   AST 26 05/31/2019   NA 142 05/31/2019   K 5.2 (H) 05/31/2019   CL 101 05/31/2019   CREATININE 0.80 05/31/2019   BUN 22 05/31/2019   CO2 33 (H) 05/31/2019   TSH 1.53 05/31/2019   HGBA1C 6.0 05/31/2019   MICROALBUR <0.7 07/06/2015    Lab Results  Component Value Date   TSH 1.53 05/31/2019   Lab Results  Component Value Date   WBC 4.1 05/31/2019   HGB 14.5 05/31/2019   HCT 43.3 05/31/2019   MCV 87.7 05/31/2019   PLT 194.0 05/31/2019   Lab Results  Component Value Date   NA 142 05/31/2019   K 5.2 (H) 05/31/2019   CO2 33 (H) 05/31/2019   GLUCOSE 98 05/31/2019   BUN 22 05/31/2019   CREATININE 0.80 05/31/2019   BILITOT 0.4 05/31/2019   ALKPHOS 59 05/31/2019   AST 26 05/31/2019   ALT 35 05/31/2019   PROT 7.1 05/31/2019   ALBUMIN 4.8 05/31/2019   CALCIUM 10.0 05/31/2019   GFR 71.11 05/31/2019   Lab Results  Component Value Date   CHOL 211 (H) 05/31/2019   Lab Results  Component Value Date   HDL 51.90 05/31/2019   Lab Results  Component Value Date   LDLCALC 127 (H) 01/28/2018   Lab Results  Component Value Date   TRIG 239.0 (H)  05/31/2019   Lab Results  Component Value Date   CHOLHDL 4 05/31/2019   Lab Results  Component Value Date   HGBA1C 6.0 05/31/2019       Assessment & Plan:   Problem List Items Addressed This Visit    Mixed hyperlipidemia   Relevant Orders   Lipid panel (Completed)   ELEVATED BP READING WITHOUT DX HYPERTENSION   Relevant Orders   CBC (Completed)   Comprehensive metabolic panel (Completed)   TSH (Completed)   Depression with anxiety    July is tough as that is the month she was widowed. We have prescribed Sertraline previously but she has always been hesitant and not started it. She now agrees to start it and a refill is sent to pharmacy. She may continue Alprazolam prn. She was counseled 20 minutes of a 25 minute visit.       Relevant Medications   sertraline (ZOLOFT) 50 MG tablet   Hyperglycemia - Primary    hgba1c acceptable, minimize simple carbs. Increase exercise as tolerated.       Relevant Orders   Hemoglobin A1c (Completed)    Other Visit Diagnoses    Breast cancer screening  I have changed Tricia Fleming's sertraline. I am also having her maintain her fish oil-omega-3 fatty acids, Biotin, lovastatin, pantoprazole, ALPRAZolam, zolpidem, and nitroGLYCERIN.  Meds ordered this encounter  Medications  . sertraline (ZOLOFT) 50 MG tablet    Sig: Take 0.5-1 tablets (25-50 mg total) by mouth daily.    Dispense:  90 tablet    Refill:  2     Penni Homans, MD

## 2019-08-05 ENCOUNTER — Ambulatory Visit (INDEPENDENT_AMBULATORY_CARE_PROVIDER_SITE_OTHER): Payer: Medicare HMO | Admitting: Family Medicine

## 2019-08-05 ENCOUNTER — Encounter: Payer: Self-pay | Admitting: Family Medicine

## 2019-08-05 ENCOUNTER — Other Ambulatory Visit: Payer: Self-pay

## 2019-08-05 VITALS — Wt 155.0 lb

## 2019-08-05 DIAGNOSIS — M546 Pain in thoracic spine: Secondary | ICD-10-CM

## 2019-08-05 DIAGNOSIS — E782 Mixed hyperlipidemia: Secondary | ICD-10-CM | POA: Diagnosis not present

## 2019-08-05 DIAGNOSIS — R030 Elevated blood-pressure reading, without diagnosis of hypertension: Secondary | ICD-10-CM | POA: Diagnosis not present

## 2019-08-05 DIAGNOSIS — M25512 Pain in left shoulder: Secondary | ICD-10-CM

## 2019-08-05 DIAGNOSIS — M542 Cervicalgia: Secondary | ICD-10-CM

## 2019-08-05 DIAGNOSIS — M8580 Other specified disorders of bone density and structure, unspecified site: Secondary | ICD-10-CM | POA: Diagnosis not present

## 2019-08-05 DIAGNOSIS — R739 Hyperglycemia, unspecified: Secondary | ICD-10-CM

## 2019-08-05 DIAGNOSIS — G5600 Carpal tunnel syndrome, unspecified upper limb: Secondary | ICD-10-CM

## 2019-08-05 DIAGNOSIS — Z23 Encounter for immunization: Secondary | ICD-10-CM | POA: Diagnosis not present

## 2019-08-05 MED ORDER — SERTRALINE HCL 50 MG PO TABS: 25.0000 mg | ORAL_TABLET | Freq: Every day | ORAL | 2 refills | Status: DC

## 2019-08-05 MED ORDER — MELOXICAM 7.5 MG PO TABS: 7.5000 mg | ORAL_TABLET | Freq: Every day | ORAL | 1 refills | Status: DC | PRN

## 2019-08-05 MED ORDER — LOVASTATIN 40 MG PO TABS: ORAL_TABLET | ORAL | 2 refills | Status: DC

## 2019-08-05 MED ORDER — ZOLPIDEM TARTRATE 10 MG PO TABS: 10.0000 mg | ORAL_TABLET | Freq: Every evening | ORAL | 1 refills | Status: DC | PRN

## 2019-08-05 MED ORDER — ALPRAZOLAM 1 MG PO TABS: 1.0000 mg | ORAL_TABLET | Freq: Two times a day (BID) | ORAL | 1 refills | Status: DC | PRN

## 2019-08-05 NOTE — Assessment & Plan Note (Signed)
hgba1c acceptable, minimize simple carbs. Increase exercise as tolerated.  

## 2019-08-05 NOTE — Assessment & Plan Note (Signed)
>>  ASSESSMENT AND PLAN FOR MIXED DIABETIC HYPERLIPIDEMIA ASSOCIATED WITH TYPE 2 DIABETES MELLITUS (HCC) WRITTEN ON 08/05/2019  9:11 AM BY BLYTH, STACEY A, MD  hgba1c acceptable, minimize simple carbs. Increase exercise as tolerated.

## 2019-08-05 NOTE — Assessment & Plan Note (Signed)
Encouraged heart healthy diet, increase exercise, avoid trans fats, consider a krill oil cap daily 

## 2019-08-07 NOTE — Assessment & Plan Note (Signed)
With pain down to area between scapula and spine. xrays ordered and encouraged moist heat and topical rubs. Ay need referral for further evaluaiton

## 2019-08-07 NOTE — Progress Notes (Signed)
Virtual Visit via Video Note  I connected with Tricia Fleming on 08/07/19 at  9:00 AM EDT by a video enabled telemedicine application and verified that I am speaking with the correct person using two identifiers.  Location: Patient: home Provider: office   I discussed the limitations of evaluation and management by telemedicine and the availability of in person appointments. The patient expressed understanding and agreed to proceed. [name], CMA was able to get patient set up on video visit    Subjective:    Patient ID: Tricia Fleming, female    DOB: 1951-08-06, 68 y.o.   MRN: [id number]  No chief complaint on file.   HPI Patient is in today for follow up on carpal tunnel syndrome, hyperglycemia, osteopenia and more. No recent febrile illness or hospitalizations. No falls or trauma. Her pain in her wrists and hands R>L despite splinting and icing. No falls or trauma but her left should is hurting for past couple of weeks. Most notably in back between spine and scapula. Denies CP/palp/SOB/HA/congestion/fevers/GI or GU c/o. Taking meds as prescribed  Past Medical History:  Diagnosis Date  . Anxiety   . Atypical chest pain 12/08/2016  . Breast lesion 06/18/2010   Qualifier: Diagnosis of  By: Charlett Blake MD, Erline Levine    . Depression   . Depression with anxiety 06/27/2011  . Hyperglycemia 07/27/2013  . Hyperlipidemia   . Osteopenia 01/15/2016  . Preventative health care 06/27/2011  . Preventative health care 06/27/2011  . SCC (squamous cell carcinoma), leg 05/04/2014   SCC   . Shoulder pain, bilateral 02/13/2013    Past Surgical History:  Procedure Laterality Date  . BREAST BIOPSY Left    needle core biopsy, benign  . TUBAL LIGATION      Family History  Problem Relation Age of Onset  . Cancer Father        metastic prostate  . Diabetes Mother   . Hypertension Mother   . Coronary artery disease Mother   . Transient ischemic attack Sister   . Heart disease Brother   . Benign  prostatic hyperplasia Brother   . Lung disease Brother        in a smoker  . Insulin resistance Brother   . Hypertension Sister   . Benign prostatic hyperplasia Brother   . Alcohol abuse Brother   . Arthritis Sister   . Diabetes Maternal Grandmother   . Coronary artery disease Maternal Grandmother   . Diabetes Maternal Grandfather   . Esophageal varices Brother   . Cirrhosis Brother   . Cancer Maternal Aunt   . Birth defects Paternal Aunt        breast    Social History   Socioeconomic History  . Marital status: Married    Spouse name: Not on file  . Number of children: Not on file  . Years of education: Not on file  . Highest education level: Not on file  Occupational History  . Not on file  Social Needs  . Financial resource strain: Not on file  . Food insecurity    Worry: Not on file    Inability: Not on file  . Transportation needs    Medical: Not on file    Non-medical: Not on file  Tobacco Use  . Smoking status: Never Smoker  . Smokeless tobacco: Never Used  Substance and Sexual Activity  . Alcohol use: Yes    Alcohol/week: 0.0 standard drinks    Comment: a couple beers a week  .  Drug use: No  . Sexual activity: Yes    Partners: Male  Lifestyle  . Physical activity    Days per week: Not on file    Minutes per session: Not on file  . Stress: Not on file  Relationships  . Social Herbalist on phone: Not on file    Gets together: Not on file    Attends religious service: Not on file    Active member of club or organization: Not on file    Attends meetings of clubs or organizations: Not on file    Relationship status: Not on file  . Intimate partner violence    Fear of current or ex partner: Not on file    Emotionally abused: Not on file    Physically abused: Not on file    Forced sexual activity: Not on file  Other Topics Concern  . Not on file  Social History Narrative  . Not on file    Outpatient Medications Prior to Visit   Medication Sig Dispense Refill  . Biotin 5000 MCG CAPS Take by mouth.    . fish oil-omega-3 fatty acids 1000 MG capsule Take 1,000 mg by mouth daily. 2 caps     . nitroGLYCERIN (NITROSTAT) 0.4 MG SL tablet Place 1 tablet (0.4 mg total) under the tongue every 5 (five) minutes as needed for chest pain. 25 tablet 1  . pantoprazole (PROTONIX) 40 MG tablet TAKE 1 TABLET BY MOUTH DAILY AS NEEDED 90 tablet 1  . ALPRAZolam (XANAX) 1 MG tablet Take 1 tablet (1 mg total) by mouth 2 (two) times daily as needed for anxiety. 180 tablet 1  . lovastatin (MEVACOR) 40 MG tablet TAKE 1 TABLET (40 MG TOTAL) BY MOUTH AT BEDTIME. 90 tablet 2  . sertraline (ZOLOFT) 50 MG tablet Take 0.5-1 tablets (25-50 mg total) by mouth daily. 90 tablet 2  . zolpidem (AMBIEN) 10 MG tablet Take 1 tablet (10 mg total) by mouth at bedtime as needed. for sleep 90 tablet 1   No facility-administered medications prior to visit.     No Known Allergies  Review of Systems  Constitutional: Positive for malaise/fatigue. Negative for fever.  HENT: Negative for congestion.   Eyes: Negative for blurred vision.  Respiratory: Negative for shortness of breath.   Cardiovascular: Negative for chest pain, palpitations and leg swelling.  Gastrointestinal: Negative for abdominal pain, blood in stool and nausea.  Genitourinary: Negative for dysuria and frequency.  Musculoskeletal: Positive for back pain, joint pain, myalgias and neck pain. Negative for falls.  Skin: Negative for rash.  Neurological: Negative for dizziness, loss of consciousness and headaches.  Endo/Heme/Allergies: Negative for environmental allergies.  Psychiatric/Behavioral: Negative for depression. The patient is nervous/anxious.        Objective:    Physical Exam Constitutional:      Appearance: Normal appearance. She is not ill-appearing.  HENT:     Head: Normocephalic and atraumatic.     Nose: Nose normal.  Pulmonary:     Effort: Pulmonary effort is normal.   Neurological:     Mental Status: She is alert.  Psychiatric:        Mood and Affect: Mood normal.        Behavior: Behavior normal.     Wt 155 lb (70.3 kg)   BMI 28.35 kg/m  Wt Readings from Last 3 Encounters:  08/05/19 155 lb (70.3 kg)  05/31/19 156 lb 12.8 oz (71.1 kg)  08/05/18 155 lb 9.6 oz (70.6  kg)    Diabetic Foot Exam - Simple   No data filed     Lab Results  Component Value Date   WBC 4.1 05/31/2019   HGB 14.5 05/31/2019   HCT 43.3 05/31/2019   PLT 194.0 05/31/2019   GLUCOSE 98 05/31/2019   CHOL 211 (H) 05/31/2019   TRIG 239.0 (H) 05/31/2019   HDL 51.90 05/31/2019   LDLDIRECT 125.0 05/31/2019   LDLCALC 127 (H) 01/28/2018   ALT 35 05/31/2019   AST 26 05/31/2019   NA 142 05/31/2019   K 5.2 (H) 05/31/2019   CL 101 05/31/2019   CREATININE 0.80 05/31/2019   BUN 22 05/31/2019   CO2 33 (H) 05/31/2019   TSH 1.53 05/31/2019   HGBA1C 6.0 05/31/2019   MICROALBUR <0.7 07/06/2015    Lab Results  Component Value Date   TSH 1.53 05/31/2019   Lab Results  Component Value Date   WBC 4.1 05/31/2019   HGB 14.5 05/31/2019   HCT 43.3 05/31/2019   MCV 87.7 05/31/2019   PLT 194.0 05/31/2019   Lab Results  Component Value Date   NA 142 05/31/2019   K 5.2 (H) 05/31/2019   CO2 33 (H) 05/31/2019   GLUCOSE 98 05/31/2019   BUN 22 05/31/2019   CREATININE 0.80 05/31/2019   BILITOT 0.4 05/31/2019   ALKPHOS 59 05/31/2019   AST 26 05/31/2019   ALT 35 05/31/2019   PROT 7.1 05/31/2019   ALBUMIN 4.8 05/31/2019   CALCIUM 10.0 05/31/2019   GFR 71.11 05/31/2019   Lab Results  Component Value Date   CHOL 211 (H) 05/31/2019   Lab Results  Component Value Date   HDL 51.90 05/31/2019   Lab Results  Component Value Date   LDLCALC 127 (H) 01/28/2018   Lab Results  Component Value Date   TRIG 239.0 (H) 05/31/2019   Lab Results  Component Value Date   CHOLHDL 4 05/31/2019   Lab Results  Component Value Date   HGBA1C 6.0 05/31/2019       Assessment &  Plan:   Problem List Items Addressed This Visit    Mixed hyperlipidemia    Encouraged heart healthy diet, increase exercise, avoid trans fats, consider a krill oil cap daily      Relevant Medications   lovastatin (MEVACOR) 40 MG tablet   ELEVATED BP READING WITHOUT DX HYPERTENSION   Relevant Medications   ALPRAZolam (XANAX) 1 MG tablet   Hyperglycemia    hgba1c acceptable, minimize simple carbs. Increase exercise as tolerated.       Relevant Medications   ALPRAZolam (XANAX) 1 MG tablet   Left shoulder pain - Primary    With pain down to area between scapula and spine. xrays ordered and encouraged moist heat and topical rubs. Ay need referral for further evaluaiton      Relevant Orders   DG Shoulder Left   Osteopenia    Encouraged to get adequate exercise, calcium and vitamin d intake      Relevant Medications   ALPRAZolam (XANAX) 1 MG tablet   CTS (carpal tunnel syndrome)    Gets some relief with Aleve. Will try Meloxicam and continue ice, splinting and consider referral if continues      Relevant Medications   sertraline (ZOLOFT) 50 MG tablet   zolpidem (AMBIEN) 10 MG tablet   ALPRAZolam (XANAX) 1 MG tablet    Other Visit Diagnoses    Hyperlipidemia, mixed       Relevant Medications   lovastatin (MEVACOR) 40  MG tablet   ALPRAZolam (XANAX) 1 MG tablet   Need for shingles vaccine       Relevant Medications   ALPRAZolam (XANAX) 1 MG tablet   Neck pain       Relevant Orders   DG Cervical Spine Complete   Thoracic back pain, unspecified back pain laterality, unspecified chronicity       Relevant Medications   meloxicam (MOBIC) 7.5 MG tablet   Other Relevant Orders   DG Thoracic Spine 2 View      I am having Tricia Fleming start on meloxicam. I am also having her maintain her fish oil-omega-3 fatty acids, Biotin, pantoprazole, nitroGLYCERIN, lovastatin, sertraline, zolpidem, and ALPRAZolam.  Meds ordered this encounter  Medications  . lovastatin (MEVACOR) 40  MG tablet    Sig: TAKE 1 TABLET (40 MG TOTAL) BY MOUTH AT BEDTIME.    Dispense:  90 tablet    Refill:  2  . sertraline (ZOLOFT) 50 MG tablet    Sig: Take 0.5-1 tablets (25-50 mg total) by mouth daily.    Dispense:  90 tablet    Refill:  2  . zolpidem (AMBIEN) 10 MG tablet    Sig: Take 1 tablet (10 mg total) by mouth at bedtime as needed. for sleep    Dispense:  90 tablet    Refill:  1    This request is for a new prescription for a controlled substance as required by Federal/State law..  . ALPRAZolam (XANAX) 1 MG tablet    Sig: Take 1 tablet (1 mg total) by mouth 2 (two) times daily as needed for anxiety.    Dispense:  180 tablet    Refill:  1    Not to exceed 5 additional fills before 07/24/2018 DX Code Needed  .  . meloxicam (MOBIC) 7.5 MG tablet    Sig: Take 1-2 tablets (7.5-15 mg total) by mouth daily as needed for pain.    Dispense:  40 tablet    Refill:  1    I discussed the assessment and treatment plan with the patient. The patient was provided an opportunity to ask questions and all were answered. The patient agreed with the plan and demonstrated an understanding of the instructions.   The patient was advised to call back or seek an in-person evaluation if the symptoms worsen or if the condition fails to improve as anticipated.  I provided 25 minutes of non-face-to-face time during this encounter.   Penni Homans, MD

## 2019-08-07 NOTE — Assessment & Plan Note (Signed)
Encouraged to get adequate exercise, calcium and vitamin d intake 

## 2019-08-07 NOTE — Assessment & Plan Note (Signed)
Gets some relief with Aleve. Will try Meloxicam and continue ice, splinting and consider referral if continues

## 2019-08-09 ENCOUNTER — Ambulatory Visit (HOSPITAL_BASED_OUTPATIENT_CLINIC_OR_DEPARTMENT_OTHER)
Admission: RE | Admit: 2019-08-09 | Discharge: 2019-08-09 | Disposition: A | Discharge status: Home / Self Care | Payer: Medicare HMO | Source: Ambulatory Visit | Attending: Family Medicine | Admitting: Family Medicine

## 2019-08-09 ENCOUNTER — Other Ambulatory Visit: Payer: Self-pay

## 2019-08-09 DIAGNOSIS — M25512 Pain in left shoulder: Secondary | ICD-10-CM | POA: Diagnosis present

## 2019-08-09 DIAGNOSIS — M546 Pain in thoracic spine: Secondary | ICD-10-CM | POA: Diagnosis not present

## 2019-08-09 DIAGNOSIS — M542 Cervicalgia: Secondary | ICD-10-CM

## 2019-08-09 DIAGNOSIS — M19012 Primary osteoarthritis, left shoulder: Secondary | ICD-10-CM | POA: Diagnosis not present

## 2019-08-10 ENCOUNTER — Telehealth: Payer: Self-pay

## 2019-08-10 DIAGNOSIS — K828 Other specified diseases of gallbladder: Secondary | ICD-10-CM

## 2019-08-10 DIAGNOSIS — M25519 Pain in unspecified shoulder: Secondary | ICD-10-CM

## 2019-08-10 NOTE — Addendum Note (Signed)
Addended by: [name] A on: 08/10/2019 11:28 AM   Modules accepted: Orders

## 2019-08-10 NOTE — Telephone Encounter (Signed)
She really needs a referral to ortho for further management. If she is willing I will place

## 2019-08-10 NOTE — Telephone Encounter (Signed)
Patient notified she voiced her understanding 

## 2019-08-10 NOTE — Telephone Encounter (Signed)
Spoke with patient she states the Meloxicam that was rx to her has not been well on her and has given her severe side effects, she wants to know can you rx her something else.  Also she wanted to let you know she had a panic attack last night Please advise     Ref to ortho has been placed and patient has agreed to have the abdominal xray completed

## 2019-08-10 NOTE — Telephone Encounter (Signed)
She really cannot have anything stronger secondary to her prescriptions for Alprazolam and Ambien. Anything stronger increases her risk of sudden death with these on board. She can try Tylenol ES 500 mg tabs 2 tabs tid x 3 days then drop to 1 tab tid. Use topical lidocaine and alternate ice and heat. Cannot give steroids due to anxiety. Hopefully the ortho appointment will be soon.

## 2019-08-10 NOTE — Telephone Encounter (Signed)
Copied from [place] [phone]. Topic: General - Other >> Aug 09, 2019  3:35 PM Keene Breath wrote: Reason for CRM: Patient called to inform the doctor and nurse that she is still having shoulder pain and the medication is not helping.  Please call to discuss.  CB# [phone]    Please advise

## 2019-09-08 DIAGNOSIS — H43822 Vitreomacular adhesion, left eye: Secondary | ICD-10-CM | POA: Diagnosis not present

## 2019-09-08 DIAGNOSIS — H35373 Puckering of macula, bilateral: Secondary | ICD-10-CM | POA: Diagnosis not present

## 2019-09-08 DIAGNOSIS — H2513 Age-related nuclear cataract, bilateral: Secondary | ICD-10-CM | POA: Diagnosis not present

## 2019-09-08 DIAGNOSIS — H4322 Crystalline deposits in vitreous body, left eye: Secondary | ICD-10-CM | POA: Diagnosis not present

## 2019-09-15 DIAGNOSIS — H35371 Puckering of macula, right eye: Secondary | ICD-10-CM | POA: Diagnosis not present

## 2019-09-15 DIAGNOSIS — H527 Unspecified disorder of refraction: Secondary | ICD-10-CM | POA: Diagnosis not present

## 2019-09-15 DIAGNOSIS — H2512 Age-related nuclear cataract, left eye: Secondary | ICD-10-CM | POA: Diagnosis not present

## 2019-09-15 DIAGNOSIS — Z9889 Other specified postprocedural states: Secondary | ICD-10-CM | POA: Diagnosis not present

## 2019-09-15 DIAGNOSIS — H2511 Age-related nuclear cataract, right eye: Secondary | ICD-10-CM | POA: Diagnosis not present

## 2019-10-06 ENCOUNTER — Other Ambulatory Visit: Payer: Self-pay

## 2019-10-06 ENCOUNTER — Ambulatory Visit (HOSPITAL_BASED_OUTPATIENT_CLINIC_OR_DEPARTMENT_OTHER)
Admission: RE | Admit: 2019-10-06 | Discharge: 2019-10-06 | Disposition: A | Discharge status: Home / Self Care | Payer: Medicare HMO | Source: Ambulatory Visit | Attending: Family Medicine | Admitting: Family Medicine

## 2019-10-06 DIAGNOSIS — K8020 Calculus of gallbladder without cholecystitis without obstruction: Secondary | ICD-10-CM | POA: Diagnosis not present

## 2019-10-06 DIAGNOSIS — K828 Other specified diseases of gallbladder: Secondary | ICD-10-CM | POA: Insufficient documentation

## 2019-10-07 DIAGNOSIS — Z01818 Encounter for other preprocedural examination: Secondary | ICD-10-CM | POA: Diagnosis not present

## 2019-10-10 DIAGNOSIS — Z79899 Other long term (current) drug therapy: Secondary | ICD-10-CM | POA: Diagnosis not present

## 2019-10-10 DIAGNOSIS — H2512 Age-related nuclear cataract, left eye: Secondary | ICD-10-CM | POA: Diagnosis not present

## 2019-10-10 DIAGNOSIS — H2513 Age-related nuclear cataract, bilateral: Secondary | ICD-10-CM | POA: Diagnosis not present

## 2019-10-10 DIAGNOSIS — E785 Hyperlipidemia, unspecified: Secondary | ICD-10-CM | POA: Diagnosis not present

## 2019-10-10 DIAGNOSIS — R69 Illness, unspecified: Secondary | ICD-10-CM | POA: Diagnosis not present

## 2019-11-09 DIAGNOSIS — Z23 Encounter for immunization: Secondary | ICD-10-CM | POA: Diagnosis not present

## 2020-01-25 DIAGNOSIS — R1013 Epigastric pain: Secondary | ICD-10-CM | POA: Diagnosis not present

## 2020-01-25 DIAGNOSIS — R1011 Right upper quadrant pain: Secondary | ICD-10-CM | POA: Diagnosis not present

## 2020-01-26 ENCOUNTER — Telehealth: Payer: Self-pay | Admitting: Family Medicine

## 2020-01-26 ENCOUNTER — Other Ambulatory Visit: Payer: Self-pay

## 2020-01-26 ENCOUNTER — Ambulatory Visit (HOSPITAL_BASED_OUTPATIENT_CLINIC_OR_DEPARTMENT_OTHER)
Admission: RE | Admit: 2020-01-26 | Discharge: 2020-01-26 | Disposition: A | Discharge status: Home / Self Care | Payer: Medicare HMO | Source: Ambulatory Visit | Attending: Family Medicine | Admitting: Family Medicine

## 2020-01-26 ENCOUNTER — Encounter: Payer: Self-pay | Admitting: Family Medicine

## 2020-01-26 ENCOUNTER — Ambulatory Visit (INDEPENDENT_AMBULATORY_CARE_PROVIDER_SITE_OTHER): Payer: Medicare HMO | Admitting: Family Medicine

## 2020-01-26 VITALS — BP 110/72 | HR 83 | Temp 95.1°F | Ht 62.0 in | Wt 156.2 lb

## 2020-01-26 DIAGNOSIS — K8020 Calculus of gallbladder without cholecystitis without obstruction: Secondary | ICD-10-CM | POA: Diagnosis not present

## 2020-01-26 DIAGNOSIS — R1011 Right upper quadrant pain: Secondary | ICD-10-CM

## 2020-01-26 MED ORDER — TRAMADOL HCL 50 MG PO TABS: 50.0000 mg | ORAL_TABLET | Freq: Four times a day (QID) | ORAL | 0 refills | Status: DC | PRN

## 2020-01-26 NOTE — Telephone Encounter (Signed)
Yes is ok

## 2020-01-26 NOTE — Telephone Encounter (Signed)
Pt states her mother is having Severe abdominal pain. They went to Urgent care and was told it may be Gallbladder related and sent her home. She wants to bring her in person today. I have made an appt with Dr. Nani Ravens for  2:15.. Please Confirm If this will be allowed in office. I will call patient back if need be. Thanks

## 2020-01-26 NOTE — Patient Instructions (Signed)
Do not drink alcohol, do any illicit/street drugs, drive or do anything that requires alertness while on this medicine.   OK to take Tylenol 1000 mg (2 extra strength tabs) or 975 mg (3 regular strength tabs) every 6 hours as needed.  Stay hydrated  We will be in touch regarding your image results and the next steps.  Let us know if you need anything.

## 2020-01-26 NOTE — Progress Notes (Signed)
Chief Complaint  Patient presents with  . Abdominal Pain    Subjective: Patient is a 69 y.o. female here for UC f/u.  Seen yesterday at [facility] for RUQ abd pain. Concern for gall bladder involvement. She has never had [place]. No fevers, N/V, L shoulder pain, bowel changes, unexplained wt changes. No appetite currently. Pain is fairly well controlled as well. Denies heart burn.   Past Medical History:  Diagnosis Date  . Anxiety   . Atypical chest pain 12/08/2016  . Breast lesion 06/18/2010   Qualifier: Diagnosis of  By: Charlett Blake MD, Erline Levine    . Depression   . Depression with anxiety 06/27/2011  . Hyperglycemia 07/27/2013  . Hyperlipidemia   . Osteopenia 01/15/2016  . Preventative health care 06/27/2011  . Preventative health care 06/27/2011  . SCC (squamous cell carcinoma), leg 05/04/2014   SCC   . Shoulder pain, bilateral 02/13/2013    Objective: BP 110/72 (BP Location: Left Arm, Patient Position: Sitting, Cuff Size: Normal)   Pulse 83   Temp (!) 95.1 F (35.1 C) (Temporal)   Ht 5\' 2"  (1.575 m)   Wt 156 lb 4 oz (70.9 kg)   SpO2 93%   BMI 28.58 kg/m  General: Awake, appears stated age Abd: BS+, S, TTP in RUQ pain, neg Murphy's Heart: RRR, no murmurs Lungs: CTAB, no rales, wheezes or rhonchi. No accessory muscle use Psych: Age appropriate judgment and insight, normal affect and mood  Assessment and Plan: Right upper quadrant abdominal pain - Plan: US Abdomen Limited RUQ, traMADol (ULTRAM) 50 MG tablet  Ck [place]. If stones or inflammation, will refer to [facility]. If neg, will trial PPI. Tramadol for breakthrough pain, Tylenol otherwise.  F/u w me prn.  The patient voiced understanding and agreement to the plan.  [place], DO 01/26/20  2:49 PM   '

## 2020-01-30 ENCOUNTER — Other Ambulatory Visit: Payer: Self-pay | Admitting: Family Medicine

## 2020-01-30 DIAGNOSIS — R945 Abnormal results of liver function studies: Secondary | ICD-10-CM

## 2020-01-30 DIAGNOSIS — R7989 Other specified abnormal findings of blood chemistry: Secondary | ICD-10-CM

## 2020-01-31 ENCOUNTER — Telehealth: Payer: Self-pay | Admitting: Family Medicine

## 2020-01-31 NOTE — Telephone Encounter (Signed)
CALLER : Tricia Fleming Call Back # [phone] FAX# [phone]  Office : [place] [phone]   Concern:  Patient states that she was referred to orthopedic doctor in [place] however, the office never got the referral but got her records.  Please Advise

## 2020-02-01 ENCOUNTER — Other Ambulatory Visit: Payer: Self-pay | Admitting: Family Medicine

## 2020-02-01 ENCOUNTER — Telehealth: Payer: Self-pay | Admitting: Family Medicine

## 2020-02-01 DIAGNOSIS — E782 Mixed hyperlipidemia: Secondary | ICD-10-CM

## 2020-02-01 DIAGNOSIS — Z23 Encounter for immunization: Secondary | ICD-10-CM

## 2020-02-01 DIAGNOSIS — R739 Hyperglycemia, unspecified: Secondary | ICD-10-CM

## 2020-02-01 DIAGNOSIS — R030 Elevated blood-pressure reading, without diagnosis of hypertension: Secondary | ICD-10-CM

## 2020-02-01 DIAGNOSIS — M8580 Other specified disorders of bone density and structure, unspecified site: Secondary | ICD-10-CM

## 2020-02-01 MED ORDER — ALPRAZOLAM 1 MG PO TABS: 1.0000 mg | ORAL_TABLET | Freq: Two times a day (BID) | ORAL | 0 refills | Status: DC | PRN

## 2020-02-01 NOTE — Telephone Encounter (Signed)
Medication: ALPRAZolam (XANAX) 1 MG tablet    Has the patient contacted their pharmacy? Yes.   (If no, request that the patient contact the pharmacy for the refill.) (If yes, when and what did the pharmacy advise?)  Preferred Pharmacy (with phone number or street name): [id number] [place], [place] [address]  [place] [place]  [address]  Phone:  [phone] Fax:  [phone]  DEA #:  [id number]  Agent: Please be advised that RX refills may take up to 3 business days. We ask that you follow-up with your pharmacy.

## 2020-02-01 NOTE — Telephone Encounter (Signed)
Re-faxed referral with OV notes

## 2020-02-01 NOTE — Telephone Encounter (Signed)
Lab order have been faxed over to [place].  Patient notified.

## 2020-02-01 NOTE — Telephone Encounter (Signed)
Patient notified that ov notes have been faxed

## 2020-02-01 NOTE — Telephone Encounter (Signed)
Last written:  08/05/19 Last ov: 01/26/20 Next ov: NONE Contract: 01/28/18 UDS: 08/05/18

## 2020-02-01 NOTE — Telephone Encounter (Signed)
Caller : Boston Schied Call Back # [phone]  Patient was seen by [facility].  Patient would like LABS drawn a [place], [place], [place] [address]  ~30.5 mi [phone] Open  Closes 4:30 PM  Please advise

## 2020-02-02 DIAGNOSIS — L814 Other melanin hyperpigmentation: Secondary | ICD-10-CM | POA: Diagnosis not present

## 2020-02-02 DIAGNOSIS — D225 Melanocytic nevi of trunk: Secondary | ICD-10-CM | POA: Diagnosis not present

## 2020-02-02 DIAGNOSIS — R945 Abnormal results of liver function studies: Secondary | ICD-10-CM | POA: Diagnosis not present

## 2020-02-02 DIAGNOSIS — L905 Scar conditions and fibrosis of skin: Secondary | ICD-10-CM | POA: Diagnosis not present

## 2020-02-02 DIAGNOSIS — L570 Actinic keratosis: Secondary | ICD-10-CM | POA: Diagnosis not present

## 2020-02-02 DIAGNOSIS — Z85828 Personal history of other malignant neoplasm of skin: Secondary | ICD-10-CM | POA: Diagnosis not present

## 2020-02-02 NOTE — Telephone Encounter (Signed)
Medication was filled

## 2020-02-02 NOTE — Telephone Encounter (Signed)
Patient notified that rx was sent in.    She also wanted lab appt canceled because she went to [facility] this morning.

## 2020-02-03 LAB — HEPATIC FUNCTION PANEL
ALT: 119 IU/L — ABNORMAL HIGH (ref 0–32)
AST: 35 IU/L (ref 0–40)
Albumin: 5.1 g/dL — ABNORMAL HIGH (ref 3.8–4.8)
Alkaline Phosphatase: 96 IU/L (ref 39–117)
Bilirubin Total: 0.4 mg/dL (ref 0.0–1.2)
Bilirubin, Direct: 0.15 mg/dL (ref 0.00–0.40)
Total Protein: 7.6 g/dL (ref 6.0–8.5)

## 2020-02-04 ENCOUNTER — Other Ambulatory Visit: Payer: Self-pay | Admitting: Family Medicine

## 2020-02-04 DIAGNOSIS — K8020 Calculus of gallbladder without cholecystitis without obstruction: Secondary | ICD-10-CM

## 2020-02-07 ENCOUNTER — Telehealth: Payer: Self-pay | Admitting: Family Medicine

## 2020-02-07 ENCOUNTER — Other Ambulatory Visit: Payer: Medicare HMO

## 2020-02-07 NOTE — Telephone Encounter (Signed)
Faxed results to number

## 2020-02-07 NOTE — Telephone Encounter (Signed)
Pt called requesting for her lab results from [place] 04/08 be sent over to Dr. Garrel Ridgel offce  [phone]

## 2020-02-13 ENCOUNTER — Telehealth: Payer: Self-pay

## 2020-02-13 ENCOUNTER — Other Ambulatory Visit: Payer: Self-pay | Admitting: Family Medicine

## 2020-02-13 MED ORDER — ZOLPIDEM TARTRATE 10 MG PO TABS: 10.0000 mg | ORAL_TABLET | Freq: Every evening | ORAL | 0 refills | Status: DC | PRN

## 2020-02-13 NOTE — Telephone Encounter (Signed)
It is six months since I saw her. I have sent in a 30 day supply of Zolpidem but she will need an appt to get any further  prescriptions

## 2020-02-13 NOTE — Telephone Encounter (Signed)
Patient called in to check on the referral for the hand specialist that was place. The patient would like for Dr. Charlett Blake or the nurse to give her a follow up  Call about this at [phone]

## 2020-02-13 NOTE — Telephone Encounter (Signed)
Requesting:   zolpidem Contract:   4/4//2019 UDS:   08/05/2018 Last Visit:08/05/2019 Next Visit:  none Last Refill:   08/05/2019   #90 with 1 refill  Please Advise

## 2020-02-13 NOTE — Telephone Encounter (Signed)
Patient called in to see if Dr. Charlett Blake could send in a prescription for  zolpidem (AMBIEN) 10 MG tablet [id number]    [id number] [place], [place]  [address] [place] [address]  Phone:  [phone] Fax:  [phone]  DEA #:  [id number]

## 2020-02-13 NOTE — Telephone Encounter (Signed)
The patient called today [facility] and they do not have her information as of yet. Could you please refax referral information again.

## 2020-02-13 NOTE — Telephone Encounter (Signed)
Have called the patient back  Please see Telephone note 01/31/20 regarding this referral. Documented message from today from the patient

## 2020-02-14 NOTE — Telephone Encounter (Signed)
Patient notified and appointment made for follow up.

## 2020-02-15 DIAGNOSIS — K8020 Calculus of gallbladder without cholecystitis without obstruction: Secondary | ICD-10-CM | POA: Diagnosis not present

## 2020-02-15 DIAGNOSIS — K838 Other specified diseases of biliary tract: Secondary | ICD-10-CM | POA: Diagnosis not present

## 2020-03-02 DIAGNOSIS — G5602 Carpal tunnel syndrome, left upper limb: Secondary | ICD-10-CM | POA: Diagnosis not present

## 2020-03-02 DIAGNOSIS — R200 Anesthesia of skin: Secondary | ICD-10-CM | POA: Diagnosis not present

## 2020-03-02 DIAGNOSIS — G5601 Carpal tunnel syndrome, right upper limb: Secondary | ICD-10-CM | POA: Diagnosis not present

## 2020-03-02 DIAGNOSIS — Z20822 Contact with and (suspected) exposure to covid-19: Secondary | ICD-10-CM | POA: Diagnosis not present

## 2020-03-02 DIAGNOSIS — Z01812 Encounter for preprocedural laboratory examination: Secondary | ICD-10-CM | POA: Diagnosis not present

## 2020-03-05 DIAGNOSIS — E785 Hyperlipidemia, unspecified: Secondary | ICD-10-CM | POA: Diagnosis not present

## 2020-03-05 DIAGNOSIS — Z79899 Other long term (current) drug therapy: Secondary | ICD-10-CM | POA: Diagnosis not present

## 2020-03-05 DIAGNOSIS — K8020 Calculus of gallbladder without cholecystitis without obstruction: Secondary | ICD-10-CM | POA: Diagnosis not present

## 2020-03-05 DIAGNOSIS — K219 Gastro-esophageal reflux disease without esophagitis: Secondary | ICD-10-CM | POA: Diagnosis not present

## 2020-03-05 DIAGNOSIS — K8010 Calculus of gallbladder with chronic cholecystitis without obstruction: Secondary | ICD-10-CM | POA: Diagnosis not present

## 2020-03-05 DIAGNOSIS — K8080 Other cholelithiasis without obstruction: Secondary | ICD-10-CM | POA: Diagnosis not present

## 2020-03-05 DIAGNOSIS — R69 Illness, unspecified: Secondary | ICD-10-CM | POA: Diagnosis not present

## 2020-03-05 DIAGNOSIS — Z888 Allergy status to other drugs, medicaments and biological substances status: Secondary | ICD-10-CM | POA: Diagnosis not present

## 2020-03-09 ENCOUNTER — Other Ambulatory Visit: Payer: Self-pay

## 2020-03-09 ENCOUNTER — Telehealth (INDEPENDENT_AMBULATORY_CARE_PROVIDER_SITE_OTHER): Payer: Medicare HMO | Admitting: Family Medicine

## 2020-03-09 DIAGNOSIS — M8580 Other specified disorders of bone density and structure, unspecified site: Secondary | ICD-10-CM | POA: Diagnosis not present

## 2020-03-09 DIAGNOSIS — Z780 Asymptomatic menopausal state: Secondary | ICD-10-CM | POA: Diagnosis not present

## 2020-03-09 DIAGNOSIS — G5600 Carpal tunnel syndrome, unspecified upper limb: Secondary | ICD-10-CM | POA: Diagnosis not present

## 2020-03-09 DIAGNOSIS — R030 Elevated blood-pressure reading, without diagnosis of hypertension: Secondary | ICD-10-CM | POA: Diagnosis not present

## 2020-03-09 DIAGNOSIS — Z79899 Other long term (current) drug therapy: Secondary | ICD-10-CM

## 2020-03-09 DIAGNOSIS — E782 Mixed hyperlipidemia: Secondary | ICD-10-CM

## 2020-03-09 DIAGNOSIS — D582 Other hemoglobinopathies: Secondary | ICD-10-CM | POA: Diagnosis not present

## 2020-03-09 DIAGNOSIS — Z23 Encounter for immunization: Secondary | ICD-10-CM | POA: Diagnosis not present

## 2020-03-09 DIAGNOSIS — E2839 Other primary ovarian failure: Secondary | ICD-10-CM | POA: Diagnosis not present

## 2020-03-09 DIAGNOSIS — G4700 Insomnia, unspecified: Secondary | ICD-10-CM

## 2020-03-09 DIAGNOSIS — K8080 Other cholelithiasis without obstruction: Secondary | ICD-10-CM

## 2020-03-09 DIAGNOSIS — R739 Hyperglycemia, unspecified: Secondary | ICD-10-CM

## 2020-03-09 DIAGNOSIS — F418 Other specified anxiety disorders: Secondary | ICD-10-CM

## 2020-03-09 MED ORDER — ZOLPIDEM TARTRATE 10 MG PO TABS: 10.0000 mg | ORAL_TABLET | Freq: Every evening | ORAL | 1 refills | Status: DC | PRN

## 2020-03-09 MED ORDER — ALPRAZOLAM 1 MG PO TABS: 1.0000 mg | ORAL_TABLET | Freq: Two times a day (BID) | ORAL | 1 refills | Status: DC | PRN

## 2020-03-11 DIAGNOSIS — K8020 Calculus of gallbladder without cholecystitis without obstruction: Secondary | ICD-10-CM | POA: Insufficient documentation

## 2020-03-11 NOTE — Assessment & Plan Note (Signed)
>>  ASSESSMENT AND PLAN FOR MIXED DIABETIC HYPERLIPIDEMIA ASSOCIATED WITH TYPE 2 DIABETES MELLITUS (HCC) WRITTEN ON 03/11/2020  5:10 PM BY BLYTH, STACEY A, MD  hgba1c acceptable, minimize simple carbs. Increase exercise as tolerated.

## 2020-03-11 NOTE — Assessment & Plan Note (Signed)
Encouraged good sleep hygiene such as dark, quiet room. No blue/green glowing lights such as computer screens in bedroom. No alcohol or stimulants in evening. Cut down on caffeine as able. Regular exercise is helpful but not just prior to bed time. Refill given on Ambien to use prn

## 2020-03-11 NOTE — Progress Notes (Signed)
Virtual Visit via Video Note  I connected with Tricia Fleming on 03/09/20 at  9:40 AM EDT by a video enabled telemedicine application and verified that I am speaking with the correct person using two identifiers.  Location: Patient: home, patient was in visit Provider: home, patient was in visit   I discussed the limitations of evaluation and management by telemedicine and the availability of in person appointments. The patient expressed understanding and agreed to proceed. Kem Boroughs, CMA was able to get the patient set up on a visit, video      Subjective:    Patient ID: Tricia Fleming, female    DOB: 05-Nov-1950, 69 y.o.   MRN: [id number]  No chief complaint on file.   HPI Patient is in today for follow up on chronic medical concerns. No recent febrile illness or hospitalizations. She continues to struggle with pain, weakness, numbness in hands. No recent fall or trauma. She is following with ortho and has been referred to neurology for more work up. She is still struggling with anxiety and insomnia but her meds are helpful. Denies CP/palp/SOB/HA/congestion/fevers/GI or GU c/o. Taking meds as prescribed  Past Medical History:  Diagnosis Date  . Anxiety   . Atypical chest pain 12/08/2016  . Breast lesion 06/18/2010   Qualifier: Diagnosis of  By: Charlett Blake MD, Erline Levine    . Depression   . Depression with anxiety 06/27/2011  . Hyperglycemia 07/27/2013  . Hyperlipidemia   . Osteopenia 01/15/2016  . Preventative health care 06/27/2011  . Preventative health care 06/27/2011  . SCC (squamous cell carcinoma), leg 05/04/2014   SCC   . Shoulder pain, bilateral 02/13/2013    Past Surgical History:  Procedure Laterality Date  . BREAST BIOPSY Left    needle core biopsy, benign  . TUBAL LIGATION      Family History  Problem Relation Age of Onset  . Cancer Father        metastic prostate  . Diabetes Mother   . Hypertension Mother   . Coronary artery disease Mother   . Transient  ischemic attack Sister   . Heart disease Brother   . Benign prostatic hyperplasia Brother   . Lung disease Brother        in a smoker  . Insulin resistance Brother   . Hypertension Sister   . Benign prostatic hyperplasia Brother   . Alcohol abuse Brother   . Arthritis Sister   . Diabetes Maternal Grandmother   . Coronary artery disease Maternal Grandmother   . Diabetes Maternal Grandfather   . Esophageal varices Brother   . Cirrhosis Brother   . Cancer Maternal Aunt   . Birth defects Paternal Aunt        breast    Social History   Socioeconomic History  . Marital status: Married    Spouse name: Not on file  . Number of children: Not on file  . Years of education: Not on file  . Highest education level: Not on file  Occupational History  . Not on file  Tobacco Use  . Smoking status: Never Smoker  . Smokeless tobacco: Never Used  Substance and Sexual Activity  . Alcohol use: Yes    Alcohol/week: 0.0 standard drinks    Comment: a couple beers a week  . Drug use: No  . Sexual activity: Yes    Partners: Male  Other Topics Concern  . Not on file  Social History Narrative  . Not on file   Social  Determinants of Health   Financial Resource Strain:   . Difficulty of Paying Living Expenses:   Food Insecurity:   . Worried About Charity fundraiser in the Last Year:   . Arboriculturist in the Last Year:   Transportation Needs:   . Film/video editor (Medical):   Marland Kitchen Lack of Transportation (Non-Medical):   Physical Activity:   . Days of Exercise per Week:   . Minutes of Exercise per Session:   Stress:   . Feeling of Stress :   Social Connections:   . Frequency of Communication with Friends and Family:   . Frequency of Social Gatherings with Friends and Family:   . Attends Religious Services:   . Active Member of Clubs or Organizations:   . Attends Archivist Meetings:   Marland Kitchen Marital Status:   Intimate Partner Violence:   . Fear of Current or Ex-Partner:    . Emotionally Abused:   Marland Kitchen Physically Abused:   . Sexually Abused:     Outpatient Medications Prior to Visit  Medication Sig Dispense Refill  . Biotin 5000 MCG CAPS Take by mouth.    . fish oil-omega-3 fatty acids 1000 MG capsule Take 1,000 mg by mouth daily. 2 caps     . lovastatin (MEVACOR) 40 MG tablet TAKE 1 TABLET (40 MG TOTAL) BY MOUTH AT BEDTIME. 90 tablet 2  . nitroGLYCERIN (NITROSTAT) 0.4 MG SL tablet Place 1 tablet (0.4 mg total) under the tongue every 5 (five) minutes as needed for chest pain. 25 tablet 1  . pantoprazole (PROTONIX) 40 MG tablet TAKE 1 TABLET BY MOUTH DAILY AS NEEDED (Patient not taking: Reported on 01/26/2020) 90 tablet 1  . sertraline (ZOLOFT) 50 MG tablet Take 0.5-1 tablets (25-50 mg total) by mouth daily. 90 tablet 2  . traMADol (ULTRAM) 50 MG tablet Take 1 tablet (50 mg total) by mouth every 6 (six) hours as needed for moderate pain. 10 tablet 0  . ALPRAZolam (XANAX) 1 MG tablet Take 1 tablet (1 mg total) by mouth 2 (two) times daily as needed for anxiety. 180 tablet 0  . zolpidem (AMBIEN) 10 MG tablet Take 1 tablet (10 mg total) by mouth at bedtime as needed. for sleep 30 tablet 0   No facility-administered medications prior to visit.    Allergies  Allergen Reactions  . Meloxicam Other (See Comments)    Review of Systems  Constitutional: Positive for malaise/fatigue. Negative for fever.  HENT: Negative for congestion.   Eyes: Negative for blurred vision.  Respiratory: Negative for shortness of breath.   Cardiovascular: Negative for chest pain, palpitations and leg swelling.  Gastrointestinal: Negative for abdominal pain, blood in stool and nausea.  Genitourinary: Negative for dysuria and frequency.  Musculoskeletal: Positive for joint pain, myalgias and neck pain. Negative for falls.  Skin: Negative for rash.  Neurological: Positive for sensory change. Negative for dizziness, loss of consciousness and headaches.  Endo/Heme/Allergies: Negative for  environmental allergies.  Psychiatric/Behavioral: Negative for depression. The patient is nervous/anxious and has insomnia.        Objective:    Physical Exam Constitutional:      Appearance: Normal appearance.  HENT:     Head: Normocephalic and atraumatic.     Right Ear: External ear normal.     Left Ear: External ear normal.     Nose: Nose normal.  Eyes:     General:        Right eye: No discharge.  Left eye: No discharge.  Pulmonary:     Effort: Pulmonary effort is normal.  Neurological:     Mental Status: She is alert and oriented to person, place, and time.  Psychiatric:        Behavior: Behavior normal.     There were no vitals taken for this visit. Wt Readings from Last 3 Encounters:  01/26/20 156 lb 4 oz (70.9 kg)  08/05/19 155 lb (70.3 kg)  05/31/19 156 lb 12.8 oz (71.1 kg)    Diabetic Foot Exam - Simple   No data filed     Lab Results  Component Value Date   WBC 4.1 05/31/2019   HGB 14.5 05/31/2019   HCT 43.3 05/31/2019   PLT 194.0 05/31/2019   GLUCOSE 98 05/31/2019   CHOL 211 (H) 05/31/2019   TRIG 239.0 (H) 05/31/2019   HDL 51.90 05/31/2019   LDLDIRECT 125.0 05/31/2019   LDLCALC 127 (H) 01/28/2018   ALT 119 (H) 02/02/2020   AST 35 02/02/2020   NA 142 05/31/2019   K 5.2 (H) 05/31/2019   CL 101 05/31/2019   CREATININE 0.80 05/31/2019   BUN 22 05/31/2019   CO2 33 (H) 05/31/2019   TSH 1.53 05/31/2019   HGBA1C 6.0 05/31/2019   MICROALBUR <0.7 07/06/2015    Lab Results  Component Value Date   TSH 1.53 05/31/2019   Lab Results  Component Value Date   WBC 4.1 05/31/2019   HGB 14.5 05/31/2019   HCT 43.3 05/31/2019   MCV 87.7 05/31/2019   PLT 194.0 05/31/2019   Lab Results  Component Value Date   NA 142 05/31/2019   K 5.2 (H) 05/31/2019   CO2 33 (H) 05/31/2019   GLUCOSE 98 05/31/2019   BUN 22 05/31/2019   CREATININE 0.80 05/31/2019   BILITOT 0.4 02/02/2020   ALKPHOS 96 02/02/2020   AST 35 02/02/2020   ALT 119 (H) 02/02/2020    PROT 7.6 02/02/2020   ALBUMIN 5.1 (H) 02/02/2020   CALCIUM 10.0 05/31/2019   GFR 71.11 05/31/2019   Lab Results  Component Value Date   CHOL 211 (H) 05/31/2019   Lab Results  Component Value Date   HDL 51.90 05/31/2019   Lab Results  Component Value Date   LDLCALC 127 (H) 01/28/2018   Lab Results  Component Value Date   TRIG 239.0 (H) 05/31/2019   Lab Results  Component Value Date   CHOLHDL 4 05/31/2019   Lab Results  Component Value Date   HGBA1C 6.0 05/31/2019       Assessment & Plan:   Problem List Items Addressed This Visit    Mixed hyperlipidemia - Primary    Encouraged heart healthy diet, increase exercise, avoid trans fats, consider a krill oil cap daily      Relevant Orders   Lipid panel   ELEVATED BP READING WITHOUT DX HYPERTENSION   Relevant Medications   ALPRAZolam (XANAX) 1 MG tablet   Other Relevant Orders   TSH   Insomnia    Encouraged good sleep hygiene such as dark, quiet room. No blue/green glowing lights such as computer screens in bedroom. No alcohol or stimulants in evening. Cut down on caffeine as able. Regular exercise is helpful but not just prior to bed time. Refill given on Ambien to use prn      Relevant Orders   DRUG MONITORING, PANEL 8 WITH CONFIRMATION, URINE   Depression with anxiety   Relevant Medications   ALPRAZolam (XANAX) 1 MG tablet   Other  Relevant Orders   DRUG MONITORING, PANEL 8 WITH CONFIRMATION, URINE   Hyperglycemia    hgba1c acceptable, minimize simple carbs. Increase exercise as tolerated.       Relevant Medications   ALPRAZolam (XANAX) 1 MG tablet   Other Relevant Orders   Hemoglobin A1c   Comprehensive metabolic panel   Osteopenia   Relevant Medications   ALPRAZolam (XANAX) 1 MG tablet   Other Relevant Orders   DG Bone Density   Abnormal hemoglobin (HCC)   Relevant Orders   CBC   CTS (carpal tunnel syndrome)    She is now having some numbness in her hands previously right hand was worst but now  left is worse. She has been following with ortho but she has been referred to neurology for EMG to investigate cause of her symptoms.       Relevant Medications   zolpidem (AMBIEN) 10 MG tablet   ALPRAZolam (XANAX) 1 MG tablet   Cholelithiasis    She just had her gallbladder removed earlier this week and she has had some pain but she does believe she is improving. Encouraged bland diet and plenty of fluids.        Other Visit Diagnoses    Estrogen deficiency       Relevant Orders   DG Bone Density   Post-menopausal       Relevant Orders   DG Bone Density   Hyperlipidemia, mixed       Relevant Medications   ALPRAZolam (XANAX) 1 MG tablet   Need for shingles vaccine       Relevant Medications   ALPRAZolam (XANAX) 1 MG tablet   High risk medication use       Relevant Orders   DRUG MONITORING, PANEL 8 WITH CONFIRMATION, URINE      I am having Sandra H. Rowe Robert maintain her fish oil-omega-3 fatty acids, Biotin, pantoprazole, nitroGLYCERIN, lovastatin, sertraline, traMADol, zolpidem, and ALPRAZolam.  Meds ordered this encounter  Medications  . zolpidem (AMBIEN) 10 MG tablet    Sig: Take 1 tablet (10 mg total) by mouth at bedtime as needed. for sleep    Dispense:  90 tablet    Refill:  1    This request is for a new prescription for a controlled substance as required by Federal/State law..  . ALPRAZolam (XANAX) 1 MG tablet    Sig: Take 1 tablet (1 mg total) by mouth 2 (two) times daily as needed for anxiety.    Dispense:  180 tablet    Refill:  1    .   I discussed the assessment and treatment plan with the patient. The patient was provided an opportunity to ask questions and all were answered. The patient agreed with the plan and demonstrated an understanding of the instructions.   The patient was advised to call back or seek an in-person evaluation if the symptoms worsen or if the condition fails to improve as anticipated.  I provided 20 minutes of non-face-to-face time  during this encounter.   Penni Homans, MD

## 2020-03-11 NOTE — Assessment & Plan Note (Signed)
She is now having some numbness in her hands previously right hand was worst but now left is worse. She has been following with ortho but she has been referred to neurology for EMG to investigate cause of her symptoms.

## 2020-03-11 NOTE — Assessment & Plan Note (Signed)
She just had her gallbladder removed earlier this week and she has had some pain but she does believe she is improving. Encouraged bland diet and plenty of fluids.

## 2020-03-11 NOTE — Assessment & Plan Note (Signed)
Encouraged heart healthy diet, increase exercise, avoid trans fats, consider a krill oil cap daily 

## 2020-03-11 NOTE — Assessment & Plan Note (Signed)
hgba1c acceptable, minimize simple carbs. Increase exercise as tolerated.  

## 2020-03-13 DIAGNOSIS — G5603 Carpal tunnel syndrome, bilateral upper limbs: Secondary | ICD-10-CM | POA: Diagnosis not present

## 2020-03-15 ENCOUNTER — Ambulatory Visit (HOSPITAL_BASED_OUTPATIENT_CLINIC_OR_DEPARTMENT_OTHER)
Admission: RE | Admit: 2020-03-15 | Discharge: 2020-03-15 | Disposition: A | Discharge status: Home / Self Care | Payer: Medicare HMO | Source: Ambulatory Visit | Attending: Family Medicine | Admitting: Family Medicine

## 2020-03-15 ENCOUNTER — Other Ambulatory Visit: Payer: Self-pay

## 2020-03-15 ENCOUNTER — Other Ambulatory Visit (INDEPENDENT_AMBULATORY_CARE_PROVIDER_SITE_OTHER): Payer: Medicare HMO

## 2020-03-15 ENCOUNTER — Other Ambulatory Visit (HOSPITAL_BASED_OUTPATIENT_CLINIC_OR_DEPARTMENT_OTHER): Payer: Self-pay | Admitting: Family Medicine

## 2020-03-15 DIAGNOSIS — Z780 Asymptomatic menopausal state: Secondary | ICD-10-CM | POA: Diagnosis not present

## 2020-03-15 DIAGNOSIS — R030 Elevated blood-pressure reading, without diagnosis of hypertension: Secondary | ICD-10-CM | POA: Diagnosis not present

## 2020-03-15 DIAGNOSIS — M8580 Other specified disorders of bone density and structure, unspecified site: Secondary | ICD-10-CM | POA: Insufficient documentation

## 2020-03-15 DIAGNOSIS — D582 Other hemoglobinopathies: Secondary | ICD-10-CM | POA: Diagnosis not present

## 2020-03-15 DIAGNOSIS — M85851 Other specified disorders of bone density and structure, right thigh: Secondary | ICD-10-CM | POA: Diagnosis not present

## 2020-03-15 DIAGNOSIS — R739 Hyperglycemia, unspecified: Secondary | ICD-10-CM | POA: Diagnosis not present

## 2020-03-15 DIAGNOSIS — E2839 Other primary ovarian failure: Secondary | ICD-10-CM | POA: Diagnosis not present

## 2020-03-15 DIAGNOSIS — Z1231 Encounter for screening mammogram for malignant neoplasm of breast: Secondary | ICD-10-CM

## 2020-03-15 DIAGNOSIS — E782 Mixed hyperlipidemia: Secondary | ICD-10-CM | POA: Diagnosis not present

## 2020-03-15 LAB — COMPREHENSIVE METABOLIC PANEL
ALT: 25 U/L (ref 0–35)
AST: 18 U/L (ref 0–37)
Albumin: 4.5 g/dL (ref 3.5–5.2)
Alkaline Phosphatase: 85 U/L (ref 39–117)
BUN: 19 mg/dL (ref 6–23)
CO2: 32 mEq/L (ref 19–32)
Calcium: 10.2 mg/dL (ref 8.4–10.5)
Chloride: 102 mEq/L (ref 96–112)
Creatinine, Ser: 0.76 mg/dL (ref 0.40–1.20)
GFR: 75.28 mL/min (ref >60.00)
Glucose, Bld: 126 mg/dL — ABNORMAL HIGH (ref 70–99)
Potassium: 5 mEq/L (ref 3.5–5.1)
Sodium: 138 mEq/L (ref 135–145)
Total Bilirubin: 0.3 mg/dL (ref 0.2–1.2)
Total Protein: 7 g/dL (ref 6.0–8.3)

## 2020-03-15 LAB — CBC
HCT: 41.7 % (ref 36.0–46.0)
Hemoglobin: 14.1 g/dL (ref 12.0–15.0)
MCHC: 33.8 g/dL (ref 30.0–36.0)
MCV: 86.8 fl (ref 78.0–100.0)
Platelets: 283 10*3/uL (ref 150.0–400.0)
RBC: 4.8 Mil/uL (ref 3.87–5.11)
RDW: 13.2 % (ref 11.5–15.5)
WBC: 4.7 10*3/uL (ref 4.0–10.5)

## 2020-03-15 LAB — HEMOGLOBIN A1C: Hgb A1c MFr Bld: 6.1 % (ref 4.6–6.5)

## 2020-03-15 LAB — LIPID PANEL
Cholesterol: 195 mg/dL (ref 0–200)
HDL: 39.9 mg/dL (ref >39.00)
NonHDL: 155.14
Total CHOL/HDL Ratio: 5
Triglycerides: 242 mg/dL — ABNORMAL HIGH (ref 0.0–149.0)
VLDL: 48.4 mg/dL — ABNORMAL HIGH (ref 0.0–40.0)

## 2020-03-15 LAB — TSH: TSH: 2.78 u[IU]/mL (ref 0.35–4.50)

## 2020-03-15 LAB — LDL CHOLESTEROL, DIRECT: Direct LDL: 114 mg/dL

## 2020-03-28 ENCOUNTER — Encounter: Payer: Self-pay | Admitting: *Deleted

## 2020-04-11 DIAGNOSIS — G5601 Carpal tunnel syndrome, right upper limb: Secondary | ICD-10-CM | POA: Diagnosis not present

## 2020-04-11 DIAGNOSIS — G5602 Carpal tunnel syndrome, left upper limb: Secondary | ICD-10-CM | POA: Diagnosis not present

## 2020-04-11 DIAGNOSIS — R200 Anesthesia of skin: Secondary | ICD-10-CM | POA: Diagnosis not present

## 2020-04-16 DIAGNOSIS — G5602 Carpal tunnel syndrome, left upper limb: Secondary | ICD-10-CM | POA: Diagnosis not present

## 2020-04-16 DIAGNOSIS — Z01812 Encounter for preprocedural laboratory examination: Secondary | ICD-10-CM | POA: Diagnosis not present

## 2020-04-16 DIAGNOSIS — Z20822 Contact with and (suspected) exposure to covid-19: Secondary | ICD-10-CM | POA: Diagnosis not present

## 2020-04-18 DIAGNOSIS — G5601 Carpal tunnel syndrome, right upper limb: Secondary | ICD-10-CM | POA: Diagnosis not present

## 2020-04-18 DIAGNOSIS — R69 Illness, unspecified: Secondary | ICD-10-CM | POA: Diagnosis not present

## 2020-04-18 DIAGNOSIS — Z79899 Other long term (current) drug therapy: Secondary | ICD-10-CM | POA: Diagnosis not present

## 2020-04-18 DIAGNOSIS — E785 Hyperlipidemia, unspecified: Secondary | ICD-10-CM | POA: Diagnosis not present

## 2020-04-20 DIAGNOSIS — G5602 Carpal tunnel syndrome, left upper limb: Secondary | ICD-10-CM | POA: Diagnosis not present

## 2020-04-20 DIAGNOSIS — Z20822 Contact with and (suspected) exposure to covid-19: Secondary | ICD-10-CM | POA: Diagnosis not present

## 2020-04-20 DIAGNOSIS — Z01812 Encounter for preprocedural laboratory examination: Secondary | ICD-10-CM | POA: Diagnosis not present

## 2020-04-23 DIAGNOSIS — H43822 Vitreomacular adhesion, left eye: Secondary | ICD-10-CM | POA: Diagnosis not present

## 2020-04-23 DIAGNOSIS — H35373 Puckering of macula, bilateral: Secondary | ICD-10-CM | POA: Diagnosis not present

## 2020-04-23 DIAGNOSIS — H2511 Age-related nuclear cataract, right eye: Secondary | ICD-10-CM | POA: Diagnosis not present

## 2020-04-23 DIAGNOSIS — Z961 Presence of intraocular lens: Secondary | ICD-10-CM | POA: Diagnosis not present

## 2020-04-24 DIAGNOSIS — Z79899 Other long term (current) drug therapy: Secondary | ICD-10-CM | POA: Diagnosis not present

## 2020-04-24 DIAGNOSIS — E785 Hyperlipidemia, unspecified: Secondary | ICD-10-CM | POA: Diagnosis not present

## 2020-04-24 DIAGNOSIS — G5602 Carpal tunnel syndrome, left upper limb: Secondary | ICD-10-CM | POA: Diagnosis not present

## 2020-04-24 DIAGNOSIS — R69 Illness, unspecified: Secondary | ICD-10-CM | POA: Diagnosis not present

## 2020-04-25 ENCOUNTER — Other Ambulatory Visit: Payer: Self-pay | Admitting: Family Medicine

## 2020-04-27 ENCOUNTER — Other Ambulatory Visit: Payer: Self-pay | Admitting: Family Medicine

## 2020-05-29 DIAGNOSIS — Z23 Encounter for immunization: Secondary | ICD-10-CM | POA: Diagnosis not present

## 2020-06-06 ENCOUNTER — Ambulatory Visit (HOSPITAL_BASED_OUTPATIENT_CLINIC_OR_DEPARTMENT_OTHER)
Admission: RE | Admit: 2020-06-06 | Discharge: 2020-06-06 | Disposition: A | Discharge status: Home / Self Care | Payer: Medicare HMO | Source: Ambulatory Visit | Attending: Family Medicine | Admitting: Family Medicine

## 2020-06-06 ENCOUNTER — Ambulatory Visit (INDEPENDENT_AMBULATORY_CARE_PROVIDER_SITE_OTHER): Payer: Medicare HMO | Admitting: Internal Medicine

## 2020-06-06 ENCOUNTER — Other Ambulatory Visit: Payer: Self-pay

## 2020-06-06 VITALS — BP 115/77 | HR 102 | Temp 98.5°F | Resp 12 | Ht 62.0 in | Wt 159.0 lb

## 2020-06-06 DIAGNOSIS — Z1231 Encounter for screening mammogram for malignant neoplasm of breast: Secondary | ICD-10-CM | POA: Diagnosis not present

## 2020-06-06 DIAGNOSIS — F418 Other specified anxiety disorders: Secondary | ICD-10-CM | POA: Diagnosis not present

## 2020-06-06 DIAGNOSIS — R5383 Other fatigue: Secondary | ICD-10-CM | POA: Diagnosis not present

## 2020-06-06 DIAGNOSIS — R69 Illness, unspecified: Secondary | ICD-10-CM | POA: Diagnosis not present

## 2020-06-06 MED ORDER — SERTRALINE HCL 100 MG PO TABS: 100.0000 mg | ORAL_TABLET | Freq: Every day | ORAL | 2 refills | Status: DC

## 2020-06-06 NOTE — Progress Notes (Signed)
Subjective:    Patient ID: Tricia Fleming, female    DOB: 1950-12-23, 69 y.o.   MRN: [id number]  DOS:  06/06/2020 Type of visit - description: Acute Her chief complaint today is fatigue. Describes simply as lack of energy, she wakes up tired, has no motivation to do anything, she has to push herself to do things. Symptoms started approximately 2 months ago.  Wt Readings from Last 3 Encounters:  06/06/20 159 lb (72.1 kg)  01/26/20 156 lb 4 oz (70.9 kg)  08/05/19 155 lb (70.3 kg)     Review of Systems Denies fever chills No night sweats No visual disturbances. No weight loss, has gained some. From time to time she gets clammy which he exercises but denies chest pain, DOE. Clammy feeling is not consistent every time she exerts. Denies nausea, vomiting or blood in the stools. Never been tested for sleep apnea. No cough On sertraline, symptoms completely controlled?  "I do not think that will ever be".  Past Medical History:  Diagnosis Date  . Anxiety   . Atypical chest pain 12/08/2016  . Breast lesion 06/18/2010   Qualifier: Diagnosis of  By: Charlett Blake MD, Erline Levine    . Depression   . Depression with anxiety 06/27/2011  . Hyperglycemia 07/27/2013  . Hyperlipidemia   . Osteopenia 01/15/2016  . Preventative health care 06/27/2011  . Preventative health care 06/27/2011  . SCC (squamous cell carcinoma), leg 05/04/2014   SCC   . Shoulder pain, bilateral 02/13/2013    Past Surgical History:  Procedure Laterality Date  . BREAST BIOPSY Left    needle core biopsy, benign  . CHOLECYSTECTOMY    . TUBAL LIGATION      Allergies as of 06/06/2020      Reactions   Meloxicam Other (See Comments)      Medication List       Accurate as of June 06, 2020  2:14 PM. If you have any questions, ask your nurse or doctor.        acetaminophen 500 MG tablet Commonly known as: TYLENOL Take 500 mg by mouth 2 (two) times daily as needed.   ALPRAZolam 1 MG tablet Commonly known as: XANAX Take  1 tablet (1 mg total) by mouth 2 (two) times daily as needed for anxiety.   Biotin 5000 MCG Caps Take by mouth.   fish oil-omega-3 fatty acids 1000 MG capsule Take 1,000 mg by mouth daily. 2 caps   lovastatin 40 MG tablet Commonly known as: MEVACOR TAKE 1 TABLET BY MOUTH EVERYDAY AT BEDTIME   nitroGLYCERIN 0.4 MG SL tablet Commonly known as: NITROSTAT Place 1 tablet (0.4 mg total) under the tongue every 5 (five) minutes as needed for chest pain.   pantoprazole 40 MG tablet Commonly known as: PROTONIX TAKE 1 TABLET BY MOUTH DAILY AS NEEDED   sertraline 50 MG tablet Commonly known as: ZOLOFT Take 0.5-1 tablets (25-50 mg total) by mouth daily.   traMADol 50 MG tablet Commonly known as: ULTRAM Take 1 tablet (50 mg total) by mouth every 6 (six) hours as needed for moderate pain.   zolpidem 10 MG tablet Commonly known as: AMBIEN Take 1 tablet (10 mg total) by mouth at bedtime as needed. for sleep          Objective:   Physical Exam BP 115/77 (BP Location: Right Arm, Patient Position: Sitting, Cuff Size: Large)   Pulse (!) 102   Temp 98.5 F (36.9 C) (Oral)   Resp 12  Ht 5\' 2"  (1.575 m)   Wt 159 lb (72.1 kg)   SpO2 95%   BMI 29.08 kg/m  General:   Well developed, NAD, BMI noted.  HEENT:  Normocephalic . Face symmetric, atraumatic Neck: No thyromegaly Lungs:  CTA B Normal respiratory effort, no intercostal retractions, no accessory muscle use. Heart: RRR,  no murmur.  Abdomen:  Not distended, soft, non-tender. No rebound or rigidity.   Skin: Not pale. Not jaundice Lower extremities: no pretibial edema bilaterally  Neurologic:  alert & oriented X3.  Speech normal, gait appropriate for age and unassisted Psych--  Cognition and judgment appear intact.  Cooperative with normal attention span and concentration.  Behavior appropriate. No anxious or depressed appearing.     Assessment      69 year old lady, PMH includes depression, anxiety, elevated BP,  hyperglycemia, high cholesterol, s/p cholecystectomy 02/2020 and B CTS surgery 03/2020 presents with  Fatigue: As described above, ROS is essentially negative, anxiety depression perhaps not completely well controlled.  She does snore but that has been going on for a while, never tested for sleep apnea. Epworth scale: 3, negative PHQ-9: 10, moderate Most recent TSH normal, Last A1c 6.1. We will repeat her CMP, CBC.  Also check a sed rate, X45, folic acid EKG: NSR Anxiety, depression is not completely well controlled, that may account for some of her fatigue.  We agreed to increase sertraline to 100 mg daily. Anxiety depression: See above Has a follow-up with PCP in few weeks.  See AVS.  This visit occurred during the SARS-CoV-2 public health emergency.  Safety protocols were in place, including screening questions prior to the visit, additional usage of staff PPE, and extensive cleaning of exam room while observing appropriate contact time as indicated for disinfecting solutions.

## 2020-06-06 NOTE — Patient Instructions (Addendum)
Increase sertraline to 100 mg daily  GO TO THE LAB : Get the blood work     Keep the follow-up with Dr. Charlett Blake

## 2020-06-07 LAB — CBC WITH DIFFERENTIAL/PLATELET
Basophils Absolute: 0.1 10*3/uL (ref 0.0–0.1)
Basophils Relative: 1.2 % (ref 0.0–3.0)
Eosinophils Absolute: 0.1 10*3/uL (ref 0.0–0.7)
Eosinophils Relative: 1.6 % (ref 0.0–5.0)
HCT: 41.9 % (ref 36.0–46.0)
Hemoglobin: 14.4 g/dL (ref 12.0–15.0)
Lymphocytes Relative: 34.4 % (ref 12.0–46.0)
Lymphs Abs: 2.2 10*3/uL (ref 0.7–4.0)
MCHC: 34.5 g/dL (ref 30.0–36.0)
MCV: 87 fl (ref 78.0–100.0)
Monocytes Absolute: 0.3 10*3/uL (ref 0.1–1.0)
Monocytes Relative: 5.4 % (ref 3.0–12.0)
Neutro Abs: 3.7 10*3/uL (ref 1.4–7.7)
Neutrophils Relative %: 57.4 % (ref 43.0–77.0)
Platelets: 250 10*3/uL (ref 150.0–400.0)
RBC: 4.82 Mil/uL (ref 3.87–5.11)
RDW: 14.1 % (ref 11.5–15.5)
WBC: 6.4 10*3/uL (ref 4.0–10.5)

## 2020-06-07 LAB — COMPREHENSIVE METABOLIC PANEL
ALT: 32 U/L (ref 0–35)
AST: 22 U/L (ref 0–37)
Albumin: 4.7 g/dL (ref 3.5–5.2)
Alkaline Phosphatase: 66 U/L (ref 39–117)
BUN: 18 mg/dL (ref 6–23)
CO2: 32 mEq/L (ref 19–32)
Calcium: 10.1 mg/dL (ref 8.4–10.5)
Chloride: 99 mEq/L (ref 96–112)
Creatinine, Ser: 0.81 mg/dL (ref 0.40–1.20)
GFR: 69.89 mL/min (ref >60.00)
Glucose, Bld: 110 mg/dL — ABNORMAL HIGH (ref 70–99)
Potassium: 4.5 mEq/L (ref 3.5–5.1)
Sodium: 138 mEq/L (ref 135–145)
Total Bilirubin: 0.3 mg/dL (ref 0.2–1.2)
Total Protein: 7.5 g/dL (ref 6.0–8.3)

## 2020-06-07 LAB — FOLATE: Folate: 24.6 ng/mL (ref >5.9)

## 2020-06-07 LAB — VITAMIN B12: Vitamin B-12: 270 pg/mL (ref 211–911)

## 2020-06-07 LAB — SEDIMENTATION RATE: Sed Rate: 2 mm/hr (ref 0–30)

## 2020-07-16 DIAGNOSIS — M65312 Trigger thumb, left thumb: Secondary | ICD-10-CM | POA: Diagnosis not present

## 2020-07-16 DIAGNOSIS — M65311 Trigger thumb, right thumb: Secondary | ICD-10-CM | POA: Diagnosis not present

## 2020-07-16 DIAGNOSIS — Z9889 Other specified postprocedural states: Secondary | ICD-10-CM | POA: Diagnosis not present

## 2020-07-24 ENCOUNTER — Other Ambulatory Visit: Payer: Self-pay

## 2020-07-24 ENCOUNTER — Ambulatory Visit (INDEPENDENT_AMBULATORY_CARE_PROVIDER_SITE_OTHER): Payer: Medicare HMO | Admitting: Family Medicine

## 2020-07-24 VITALS — BP 118/66 | HR 92 | Temp 98.2°F | Resp 12 | Ht 62.0 in | Wt 161.0 lb

## 2020-07-24 DIAGNOSIS — R002 Palpitations: Secondary | ICD-10-CM

## 2020-07-24 DIAGNOSIS — R5383 Other fatigue: Secondary | ICD-10-CM

## 2020-07-24 DIAGNOSIS — E538 Deficiency of other specified B group vitamins: Secondary | ICD-10-CM

## 2020-07-24 DIAGNOSIS — E782 Mixed hyperlipidemia: Secondary | ICD-10-CM | POA: Diagnosis not present

## 2020-07-24 DIAGNOSIS — Z23 Encounter for immunization: Secondary | ICD-10-CM | POA: Diagnosis not present

## 2020-07-24 DIAGNOSIS — R739 Hyperglycemia, unspecified: Secondary | ICD-10-CM

## 2020-07-24 DIAGNOSIS — M8580 Other specified disorders of bone density and structure, unspecified site: Secondary | ICD-10-CM

## 2020-07-24 DIAGNOSIS — D582 Other hemoglobinopathies: Secondary | ICD-10-CM | POA: Diagnosis not present

## 2020-07-24 MED ORDER — SERTRALINE HCL 100 MG PO TABS: 100.0000 mg | ORAL_TABLET | Freq: Every day | ORAL | 2 refills | Status: DC

## 2020-07-24 NOTE — Assessment & Plan Note (Signed)
Continue to monitor and asymptomatic

## 2020-07-24 NOTE — Assessment & Plan Note (Signed)
Wakes up tired and feels hopeless that she will get her energy back she wonders it it dates back to her cholecystectomy. She lacks motivation to some degree. Notes she sweats easily and she is noting some deconditioning with some low back pain and stiffness. She also notes anhedonia. Will check labs and encouraged to increase activity systematically as able and maintain a high quality diet with low carbohydrates and hydrate well. Check labs

## 2020-07-24 NOTE — Assessment & Plan Note (Addendum)
Encouraged heart healthy diet, increase exercise, avoid trans fats, consider a krill oil cap daily. Tolerating Lovastatin

## 2020-07-24 NOTE — Assessment & Plan Note (Signed)
hgba1c acceptable, minimize simple carbs. Increase exercise as tolerated.  

## 2020-07-24 NOTE — Assessment & Plan Note (Signed)
Encouraged to get adequate exercise, calcium and vitamin d intake 

## 2020-07-24 NOTE — Patient Instructions (Signed)
Pernicious Anemia  Anemia is a condition in which the body does not have enough red blood cells or hemoglobin. Hemoglobin is a substance in red blood cells that carries oxygen. Pernicious anemia happens when the body does not have enough of a protein made in the stomach called intrinsic factor (IF). Without enough IF, your digestive tract cannot absorb enough of the vitamin B12 that your body needs to make red blood cells. Normally, you can get enough vitamin B12 from eating foods such as meat, poultry, eggs, and dairy products. If you have pernicious anemia, you do not absorb enough vitamin B12 from your diet, and anemia develops over time. When you have anemia, your organs may not work properly and you may feel very tired. Untreated pernicious anemia can lead to severe symptoms of anemia, including chest pain, heart failure, and permanent nervous system damage. What are the causes? The cause of this condition is not known. Pernicious anemia is believed to be an autoimmune disease. When you have an autoimmune disease, your body's defense system (immune system) mistakenly attacks normal cells in your body. With pernicious anemia, the immune system attacks cells that line the inside of the stomach (parietal cells). These cells secrete stomach acids and IF. What increases the risk? You are more likely to develop this condition if you:  Are older than age 40.  Have a family history of pernicious anemia.  Are of Northern European or Scandinavian descent.  Have another type of autoimmune disease. What are the signs or symptoms? Pernicious anemia symptoms may take many years to develop. This condition usually does not cause symptoms until after a person is older than age 30. Signs and symptoms due to anemia include:  Tiredness.  Light-headedness.  A sore tongue or a burning sensation on the tongue.  A smooth red tongue.  Shortness of breath.  Fast heartbeat.  Chest pain. Stomach symptoms  due to gradual loss of parietal cells include:  Nausea or vomiting.  Heartburn.  Weight loss without trying.  Diarrhea. Signs and symptoms due to nervous system damage include:  Dizziness.  Being unsteady while walking.  Tingling or numbness of hands and feet.  Irritability.  Depression.  Hallucinations or delusions.  Loss of smell. How is this diagnosed? In many cases, anemia may be found after you have a routine blood test. This condition may also be diagnosed based on your symptoms, having a family history of the condition, and a physical exam. You may also have tests to confirm the diagnosis. These may include blood tests to:  Count the different types of blood cells (complete blood count or CBC).  Test for different types of anemia.  Check for proteins made by your immune system (antibodies) that attack parietal cells. Almost all people with pernicious anemia have these antibodies.  Check your B12 level. How is this treated? This condition may be treated with vitamin B12 replacement. This may include:  Injections of vitamin B12. This is the most common treatment. You will also have your blood level of B12 checked regularly. After you have a normal level of vitamin B12, and the anemia has been corrected, the injections can be given about once a month.  Taking a pill by mouth that contains a large dose of vitamin B12.  Using a spray that you breathe in through your nose (nasal spray). A vitamin B12 nasal spray may be used to treat people who are not able to swallow supplements.  Long-term monitoring and regular visits to a   health care provider. If the condition is detected and treatment is started in the early stages, most people do not develop complications. Treatment reverses the condition and prevents future anemia, but it must be continued for life. Having pernicious anemia also puts you at higher risk for stomach cancer. Follow these instructions at home:  Take  over-the-counter and prescription medicines only as told by your health care provider.  Return to your normal activities as told by your health care provider. Ask your health care provider what activities are safe for you.  Keep all follow-up visits as told by your health care provider. This is important. Contact a health care provider if:  You develop new symptoms.  Your symptoms return or get worse after treatment.  You have stomach pain.  You feel full after eating a small meal.  You have trouble swallowing. Get help right away if:  You have chest pain.  You have a rapid heartbeat.  You have dizziness or you faint.  You have trouble breathing.  You have a feeling as if your heart is skipping beats or fluttering (palpitations).  You have pain, swelling, or redness in an arm or leg.  You cough up blood. These symptoms may represent a serious problem that is an emergency. Do not wait to see if the symptoms will go away. Get medical help right away. Call your local emergency services (911 in the [place] Do not drive yourself to the hospital. Summary  Pernicious anemia happens when your body cannot make enough red blood cells due to vitamin B12 deficiency.  This condition is usually caused by an autoimmune disease that attacks stomach cells that produce intrinsic factor (IF). You need IF to absorb vitamin B12.  Pernicious anemia symptoms may take many years to develop.  This condition is sometimes discovered during routine blood testing.  Treatment of this condition includes vitamin B12 replacement for life. This information is not intended to replace advice given to you by your health care provider. Make sure you discuss any questions you have with your health care provider. Document Revised: 03/28/2019 Document Reviewed: 01/27/2019 Elsevier Patient Education  2020 [employer]  

## 2020-07-24 NOTE — Assessment & Plan Note (Signed)
>>  ASSESSMENT AND PLAN FOR MIXED DIABETIC HYPERLIPIDEMIA ASSOCIATED WITH TYPE 2 DIABETES MELLITUS (HCC) WRITTEN ON 07/24/2020  9:42 AM BY BLYTH, STACEY A, MD  hgba1c acceptable, minimize simple carbs. Increase exercise as tolerated

## 2020-07-25 DIAGNOSIS — E538 Deficiency of other specified B group vitamins: Secondary | ICD-10-CM | POA: Insufficient documentation

## 2020-07-25 DIAGNOSIS — R002 Palpitations: Secondary | ICD-10-CM | POA: Insufficient documentation

## 2020-07-25 MED ORDER — ROSUVASTATIN CALCIUM 40 MG PO TABS: 40.0000 mg | ORAL_TABLET | Freq: Every day | ORAL | 1 refills | Status: DC

## 2020-07-25 NOTE — Assessment & Plan Note (Signed)
Infrequent and no associated symptoms. Will proceed with echo and consider cardiology referral if worsens or any abnormality with echo

## 2020-07-25 NOTE — Assessment & Plan Note (Signed)
Supplement and monitor 

## 2020-07-25 NOTE — Progress Notes (Signed)
Subjective:    Patient ID: Tricia Fleming, female    DOB: 1950/12/17, 69 y.o.   MRN: [id number]  Chief Complaint  Patient presents with  . Follow-up    Hyperlimpidemia    HPI Patient is in today for follow upon chronic medical concerns. No recent febrile illness or hospitalizations. She continues to struggle with anxiety frequently especially in the morning. Continues to struggle with family stressors. No polyuria or polydipsia. Notes anhedonia but does not endorse suicidal ideation.  ideation. Notes an increase in po intake. Denies CP/palp/SOB/HA/congestion/fevers/GI or GU c/o. Taking meds as prescribed  Past Medical History:  Diagnosis Date  . Anxiety   . Atypical chest pain 12/08/2016  . Breast lesion 06/18/2010   Qualifier: Diagnosis of  By: Charlett Blake MD, Erline Levine    . Depression   . Depression with anxiety 06/27/2011  . Hyperglycemia 07/27/2013  . Hyperlipidemia   . Osteopenia 01/15/2016  . Preventative health care 06/27/2011  . Preventative health care 06/27/2011  . SCC (squamous cell carcinoma), leg 05/04/2014   SCC   . Shoulder pain, bilateral 02/13/2013    Past Surgical History:  Procedure Laterality Date  . BREAST BIOPSY Left    needle core biopsy, benign  . CHOLECYSTECTOMY    . TUBAL LIGATION      Family History  Problem Relation Age of Onset  . Cancer Father        metastic prostate  . Diabetes Mother   . Hypertension Mother   . Coronary artery disease Mother   . Transient ischemic attack Sister   . Heart disease Brother   . Benign prostatic hyperplasia Brother   . Lung disease Brother        in a smoker  . Insulin resistance Brother   . Hypertension Sister   . Benign prostatic hyperplasia Brother   . Alcohol abuse Brother   . Arthritis Sister   . Diabetes Maternal Grandmother   . Coronary artery disease Maternal Grandmother   . Diabetes Maternal Grandfather   . Esophageal varices Brother   . Cirrhosis Brother   . Cancer Maternal Aunt   . Birth defects  Paternal Aunt        breast    Social History   Socioeconomic History  . Marital status: Married    Spouse name: Not on file  . Number of children: Not on file  . Years of education: Not on file  . Highest education level: Not on file  Occupational History  . Not on file  Tobacco Use  . Smoking status: Never Smoker  . Smokeless tobacco: Never Used  Substance and Sexual Activity  . Alcohol use: Yes    Alcohol/week: 0.0 standard drinks    Comment: a couple beers a week  . Drug use: No  . Sexual activity: Yes    Partners: Male  Other Topics Concern  . Not on file  Social History Narrative  . Not on file   Social Determinants of Health   Financial Resource Strain:   . Difficulty of Paying Living Expenses: Not on file  Food Insecurity:   . Worried About Charity fundraiser in the Last Year: Not on file  . Ran Out of Food in the Last Year: Not on file  Transportation Needs:   . Lack of Transportation (Medical): Not on file  . Lack of Transportation (Non-Medical): Not on file  Physical Activity:   . Days of Exercise per Week: Not on file  . Minutes of  Exercise per Session: Not on file  Stress:   . Feeling of Stress : Not on file  Social Connections:   . Frequency of Communication with Friends and Family: Not on file  . Frequency of Social Gatherings with Friends and Family: Not on file  . Attends Religious Services: Not on file  . Active Member of Clubs or Organizations: Not on file  . Attends Archivist Meetings: Not on file  . Marital Status: Not on file  Intimate Partner Violence:   . Fear of Current or Ex-Partner: Not on file  . Emotionally Abused: Not on file  . Physically Abused: Not on file  . Sexually Abused: Not on file    Outpatient Medications Prior to Visit  Medication Sig Dispense Refill  . acetaminophen (TYLENOL) 500 MG tablet Take 500 mg by mouth 2 (two) times daily as needed.    . ALPRAZolam (XANAX) 1 MG tablet Take 1 tablet (1 mg total)  by mouth 2 (two) times daily as needed for anxiety. 180 tablet 1  . Biotin 5000 MCG CAPS Take by mouth.    . fish oil-omega-3 fatty acids 1000 MG capsule Take 1,000 mg by mouth daily. 2 caps     . nitroGLYCERIN (NITROSTAT) 0.4 MG SL tablet Place 1 tablet (0.4 mg total) under the tongue every 5 (five) minutes as needed for chest pain. 25 tablet 1  . pantoprazole (PROTONIX) 40 MG tablet TAKE 1 TABLET BY MOUTH DAILY AS NEEDED 90 tablet 1  . zolpidem (AMBIEN) 10 MG tablet Take 1 tablet (10 mg total) by mouth at bedtime as needed. for sleep 90 tablet 1  . lovastatin (MEVACOR) 40 MG tablet TAKE 1 TABLET BY MOUTH EVERYDAY AT BEDTIME 90 tablet 2  . sertraline (ZOLOFT) 100 MG tablet Take 1 tablet (100 mg total) by mouth daily. 30 tablet 2  . traMADol (ULTRAM) 50 MG tablet Take 1 tablet (50 mg total) by mouth every 6 (six) hours as needed for moderate pain. 10 tablet 0   No facility-administered medications prior to visit.    Allergies  Allergen Reactions  . Meloxicam Other (See Comments)    Review of Systems  Constitutional: Positive for malaise/fatigue. Negative for fever.  HENT: Negative for congestion.   Eyes: Negative for blurred vision.  Respiratory: Negative for shortness of breath.   Cardiovascular: Negative for chest pain, palpitations and leg swelling.  Gastrointestinal: Positive for constipation. Negative for abdominal pain, blood in stool and nausea.  Genitourinary: Negative for dysuria and frequency.  Musculoskeletal: Negative for falls.  Skin: Negative for rash.  Neurological: Negative for dizziness, loss of consciousness and headaches.  Endo/Heme/Allergies: Negative for environmental allergies.  Psychiatric/Behavioral: Negative for depression. The patient is nervous/anxious.        Objective:    Physical Exam Vitals and nursing note reviewed.  Constitutional:      General: She is not in acute distress.    Appearance: She is well-developed.  HENT:     Head: Normocephalic  and atraumatic.     Nose: Nose normal.  Eyes:     General:        Right eye: No discharge.        Left eye: No discharge.  Cardiovascular:     Rate and Rhythm: Normal rate and regular rhythm.     Heart sounds: No murmur heard.   Pulmonary:     Effort: Pulmonary effort is normal.     Breath sounds: Normal breath sounds.  Abdominal:  General: Bowel sounds are normal.     Palpations: Abdomen is soft.     Tenderness: There is no abdominal tenderness.  Musculoskeletal:     Cervical back: Normal range of motion and neck supple.  Skin:    General: Skin is warm and dry.  Neurological:     Mental Status: She is alert and oriented to person, place, and time.     BP 118/66 (BP Location: Right Arm, Patient Position: Sitting, Cuff Size: Small)   Pulse 92   Temp 98.2 F (36.8 C) (Oral)   Resp 12   Ht _0  (1.575 m)   Wt 161 lb (73 kg)   SpO2 96%   BMI 29.45 kg/m  Wt Readings from Last 3 Encounters:  07/24/20 161 lb (73 kg)  06/06/20 159 lb (72.1 kg)  01/26/20 156 lb 4 oz (70.9 kg)    Diabetic Foot Exam - Simple   No data filed     Lab Results  Component Value Date   WBC 5.2 07/24/2020   HGB 14.8 07/24/2020   HCT 43.4 07/24/2020   PLT 220 07/24/2020   GLUCOSE 103 (H) 07/24/2020   CHOL 227 (H) 07/24/2020   TRIG 205 (H) 07/24/2020   HDL 55 07/24/2020   LDLDIRECT 114.0 03/15/2020   LDLCALC 138 (H) 07/24/2020   ALT 37 (H) 07/24/2020   AST 29 07/24/2020   NA 138 07/24/2020   K 4.9 07/24/2020   CL 98 07/24/2020   CREATININE 0.80 07/24/2020   BUN 19 07/24/2020   CO2 29 07/24/2020   TSH 2.33 07/24/2020   HGBA1C 6.1 03/15/2020   MICROALBUR <0.7 07/06/2015    Lab Results  Component Value Date   TSH 2.33 07/24/2020   Lab Results  Component Value Date   WBC 5.2 07/24/2020   HGB 14.8 07/24/2020   HCT 43.4 07/24/2020   MCV 86.5 07/24/2020   PLT 220 07/24/2020   Lab Results  Component Value Date   NA 138 07/24/2020   K 4.9 07/24/2020   CO2 29 07/24/2020    GLUCOSE 103 (H) 07/24/2020   BUN 19 07/24/2020   CREATININE 0.80 07/24/2020   BILITOT 0.5 07/24/2020   ALKPHOS 66 06/06/2020   AST 29 07/24/2020   ALT 37 (H) 07/24/2020   PROT 7.3 07/24/2020   ALBUMIN 4.7 06/06/2020   CALCIUM 10.1 07/24/2020   GFR 69.89 06/06/2020   Lab Results  Component Value Date   CHOL 227 (H) 07/24/2020   Lab Results  Component Value Date   HDL 55 07/24/2020   Lab Results  Component Value Date   LDLCALC 138 (H) 07/24/2020   Lab Results  Component Value Date   TRIG 205 (H) 07/24/2020   Lab Results  Component Value Date   CHOLHDL 4.1 07/24/2020   Lab Results  Component Value Date   HGBA1C 6.1 03/15/2020       Assessment & Plan:   Problem List Items Addressed This Visit    Mixed hyperlipidemia    Encouraged heart healthy diet, increase exercise, avoid trans fats, consider a krill oil cap daily. Tolerating Lovastatin      Relevant Medications   rosuvastatin (CRESTOR) 40 MG tablet   Other Relevant Orders   Lipid panel (Completed)   TSH (Completed)   Lipid panel   Comp Met (CMET)   Hyperglycemia    hgba1c acceptable, minimize simple carbs. Increase exercise as tolerated      Relevant Orders   Comprehensive metabolic panel (Completed)  TSH (Completed)   Osteopenia    Encouraged to get adequate exercise, calcium and vitamin d intake      Abnormal hemoglobin (HCC)    Continue to monitor and asymptomatic      Relevant Orders   CBC with Differential/Platelet (Completed)   Intrinsic Factor Antibodies   Fatigue    Wakes up tired and feels hopeless that she will get her energy back she wonders it it dates back to her cholecystectomy. She lacks motivation to some degree. Notes she sweats easily and she is noting some deconditioning with some low back pain and stiffness. She also notes anhedonia. Will check labs and encouraged to increase activity systematically as able and maintain a high quality diet with low carbohydrates and hydrate  well. Check labs      Relevant Orders   Sedimentation rate (Completed)   Palpitations    Infrequent and no associated symptoms. Will proceed with echo and consider cardiology referral if worsens or any abnormality with echo      Relevant Orders   ECHOCARDIOGRAM COMPLETE   Low vitamin B12 level - Primary    Supplement and monitor      Relevant Orders   Intrinsic Factor Antibodies   Sedimentation rate (Completed)   Vitamin B12 (Completed)    Other Visit Diagnoses    Influenza vaccine needed       Relevant Orders   Flu Vaccine QUAD High Dose(Fluad) (Completed)      I have discontinued Nancie Neas. Yi's traMADol and lovastatin. I am also having her start on rosuvastatin. Additionally, I am having her maintain her fish oil-omega-3 fatty acids, Biotin, pantoprazole, nitroGLYCERIN, zolpidem, ALPRAZolam, acetaminophen, and sertraline.  Meds ordered this encounter  Medications  . sertraline (ZOLOFT) 100 MG tablet    Sig: Take 1 tablet (100 mg total) by mouth daily.    Dispense:  30 tablet    Refill:  2  . rosuvastatin (CRESTOR) 40 MG tablet    Sig: Take 1 tablet (40 mg total) by mouth at bedtime.    Dispense:  90 tablet    Refill:  1     Penni Homans, MD

## 2020-07-27 LAB — COMPREHENSIVE METABOLIC PANEL
AG Ratio: 1.9 (calc) (ref 1.0–2.5)
ALT: 37 U/L — ABNORMAL HIGH (ref 6–29)
AST: 29 U/L (ref 10–35)
Albumin: 4.8 g/dL (ref 3.6–5.1)
Alkaline phosphatase (APISO): 68 U/L (ref 37–153)
BUN: 19 mg/dL (ref 7–25)
CO2: 29 mmol/L (ref 20–32)
Calcium: 10.1 mg/dL (ref 8.6–10.4)
Chloride: 98 mmol/L (ref 98–110)
Creat: 0.8 mg/dL (ref 0.60–0.93)
Globulin: 2.5 g/dL (calc) (ref 1.9–3.7)
Glucose, Bld: 103 mg/dL — ABNORMAL HIGH (ref 65–99)
Potassium: 4.9 mmol/L (ref 3.5–5.3)
Sodium: 138 mmol/L (ref 135–146)
Total Bilirubin: 0.5 mg/dL (ref 0.2–1.2)
Total Protein: 7.3 g/dL (ref 6.1–8.1)

## 2020-07-27 LAB — LIPID PANEL
Cholesterol: 227 mg/dL — ABNORMAL HIGH (ref <200)
HDL: 55 mg/dL (ref >=50)
LDL Cholesterol (Calc): 138 mg/dL (calc) — ABNORMAL HIGH
Non-HDL Cholesterol (Calc): 172 mg/dL (calc) — ABNORMAL HIGH (ref <130)
Total CHOL/HDL Ratio: 4.1 (calc) (ref <5.0)
Triglycerides: 205 mg/dL — ABNORMAL HIGH (ref <150)

## 2020-07-27 LAB — CBC WITH DIFFERENTIAL/PLATELET
Absolute Monocytes: 354 cells/uL (ref 200–950)
Basophils Absolute: 42 cells/uL (ref 0–200)
Basophils Relative: 0.8 %
Eosinophils Absolute: 73 cells/uL (ref 15–500)
Eosinophils Relative: 1.4 %
HCT: 43.4 % (ref 35.0–45.0)
Hemoglobin: 14.8 g/dL (ref 11.7–15.5)
Lymphs Abs: 1550 cells/uL (ref 850–3900)
MCH: 29.5 pg (ref 27.0–33.0)
MCHC: 34.1 g/dL (ref 32.0–36.0)
MCV: 86.5 fL (ref 80.0–100.0)
MPV: 10.2 fL (ref 7.5–12.5)
Monocytes Relative: 6.8 %
Neutro Abs: 3182 cells/uL (ref 1500–7800)
Neutrophils Relative %: 61.2 %
Platelets: 220 10*3/uL (ref 140–400)
RBC: 5.02 10*6/uL (ref 3.80–5.10)
RDW: 13.4 % (ref 11.0–15.0)
Total Lymphocyte: 29.8 %
WBC: 5.2 10*3/uL (ref 3.8–10.8)

## 2020-07-27 LAB — INTRINSIC FACTOR ANTIBODIES: Intrinsic Factor: NEGATIVE

## 2020-07-27 LAB — VITAMIN B12: Vitamin B-12: 479 pg/mL (ref 200–1100)

## 2020-07-27 LAB — TSH: TSH: 2.33 mIU/L (ref 0.40–4.50)

## 2020-07-27 LAB — SEDIMENTATION RATE: Sed Rate: 2 mm/h (ref 0–30)

## 2020-08-15 DIAGNOSIS — R69 Illness, unspecified: Secondary | ICD-10-CM | POA: Diagnosis not present

## 2020-08-16 DIAGNOSIS — R69 Illness, unspecified: Secondary | ICD-10-CM | POA: Diagnosis not present

## 2020-08-17 ENCOUNTER — Ambulatory Visit (HOSPITAL_BASED_OUTPATIENT_CLINIC_OR_DEPARTMENT_OTHER)
Admission: RE | Admit: 2020-08-17 | Discharge: 2020-08-17 | Disposition: A | Discharge status: Home / Self Care | Payer: Medicare HMO | Source: Ambulatory Visit | Attending: Family Medicine | Admitting: Family Medicine

## 2020-08-17 ENCOUNTER — Other Ambulatory Visit: Payer: Self-pay

## 2020-08-17 DIAGNOSIS — I340 Nonrheumatic mitral (valve) insufficiency: Secondary | ICD-10-CM | POA: Diagnosis not present

## 2020-08-17 DIAGNOSIS — I058 Other rheumatic mitral valve diseases: Secondary | ICD-10-CM

## 2020-08-17 DIAGNOSIS — I313 Pericardial effusion (noninflammatory): Secondary | ICD-10-CM | POA: Diagnosis not present

## 2020-08-17 DIAGNOSIS — R002 Palpitations: Secondary | ICD-10-CM | POA: Insufficient documentation

## 2020-08-17 LAB — ECHOCARDIOGRAM COMPLETE
Area-P 1/2: 3.63 cm2
S' Lateral: 3.02 cm

## 2020-08-20 ENCOUNTER — Other Ambulatory Visit: Payer: Self-pay | Admitting: Family Medicine

## 2020-08-20 DIAGNOSIS — E782 Mixed hyperlipidemia: Secondary | ICD-10-CM

## 2020-08-20 DIAGNOSIS — I38 Endocarditis, valve unspecified: Secondary | ICD-10-CM

## 2020-08-20 DIAGNOSIS — R0789 Other chest pain: Secondary | ICD-10-CM

## 2020-08-20 DIAGNOSIS — R002 Palpitations: Secondary | ICD-10-CM

## 2020-08-20 DIAGNOSIS — R739 Hyperglycemia, unspecified: Secondary | ICD-10-CM

## 2020-08-20 NOTE — Progress Notes (Signed)
Pt will get referral and consult with cardiologist

## 2020-08-31 IMAGING — DX DG CERVICAL SPINE COMPLETE 4+V
5 series · 5 of 5 positions shown · non-contrast
Comparison: None.

CLINICAL DATA: Left-sided neck pain and shoulder pain.

EXAM:
CERVICAL SPINE - COMPLETE 4+ VIEW

[c-spine lat]
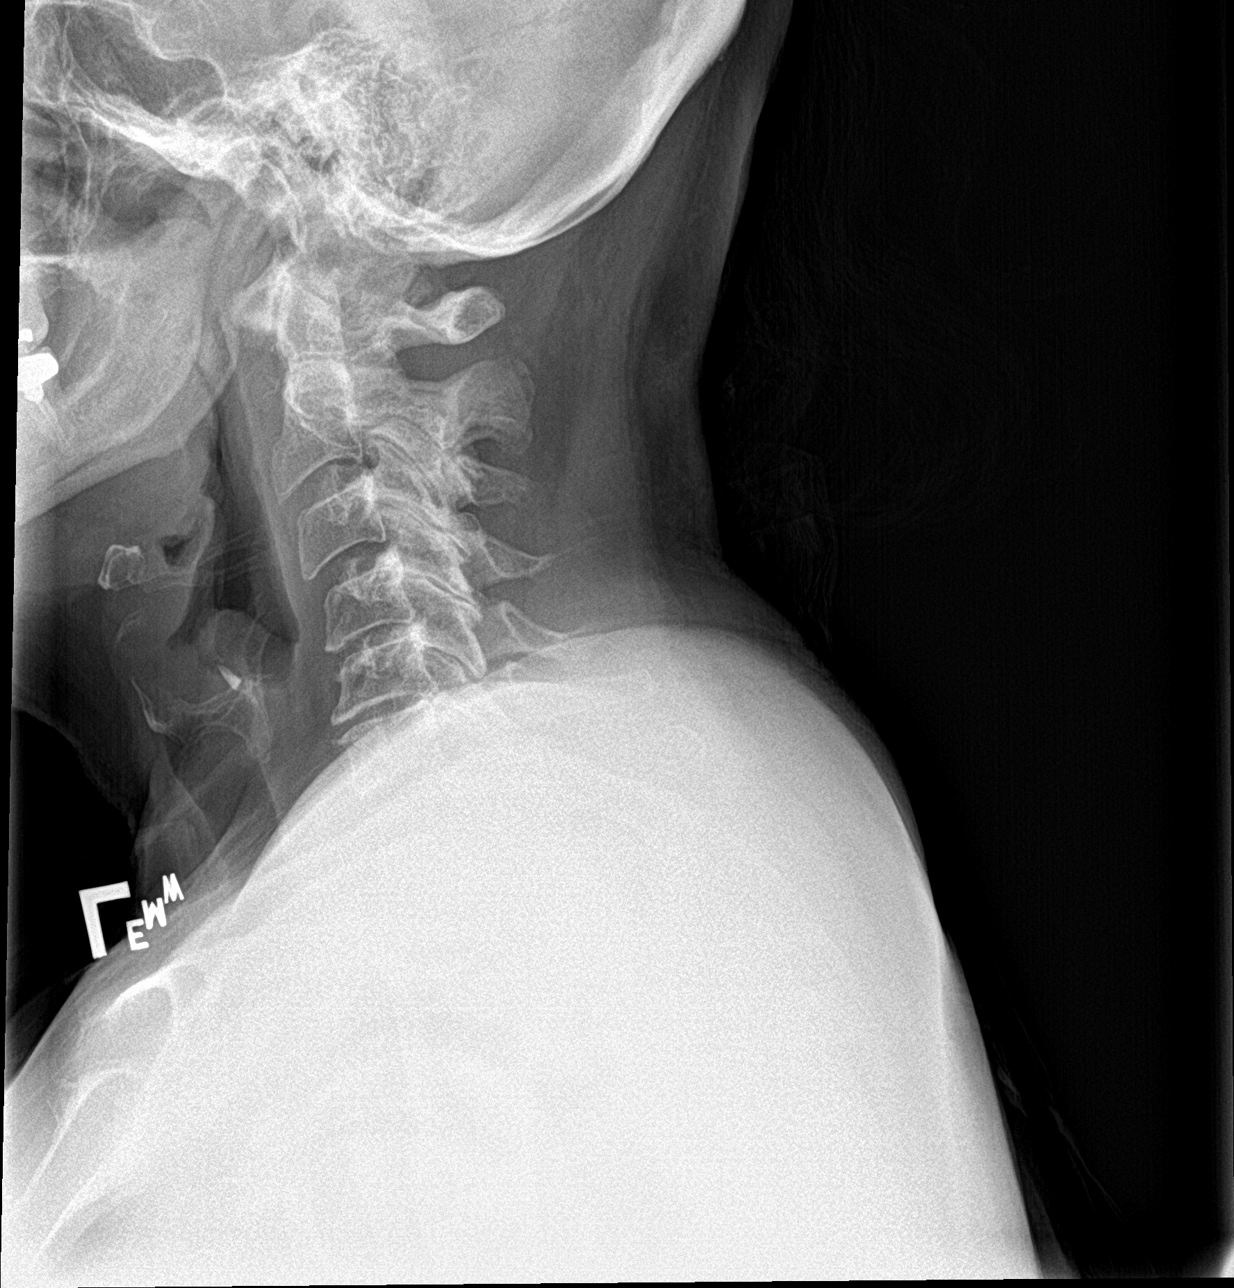

[c-spine obl (1 of 2)]
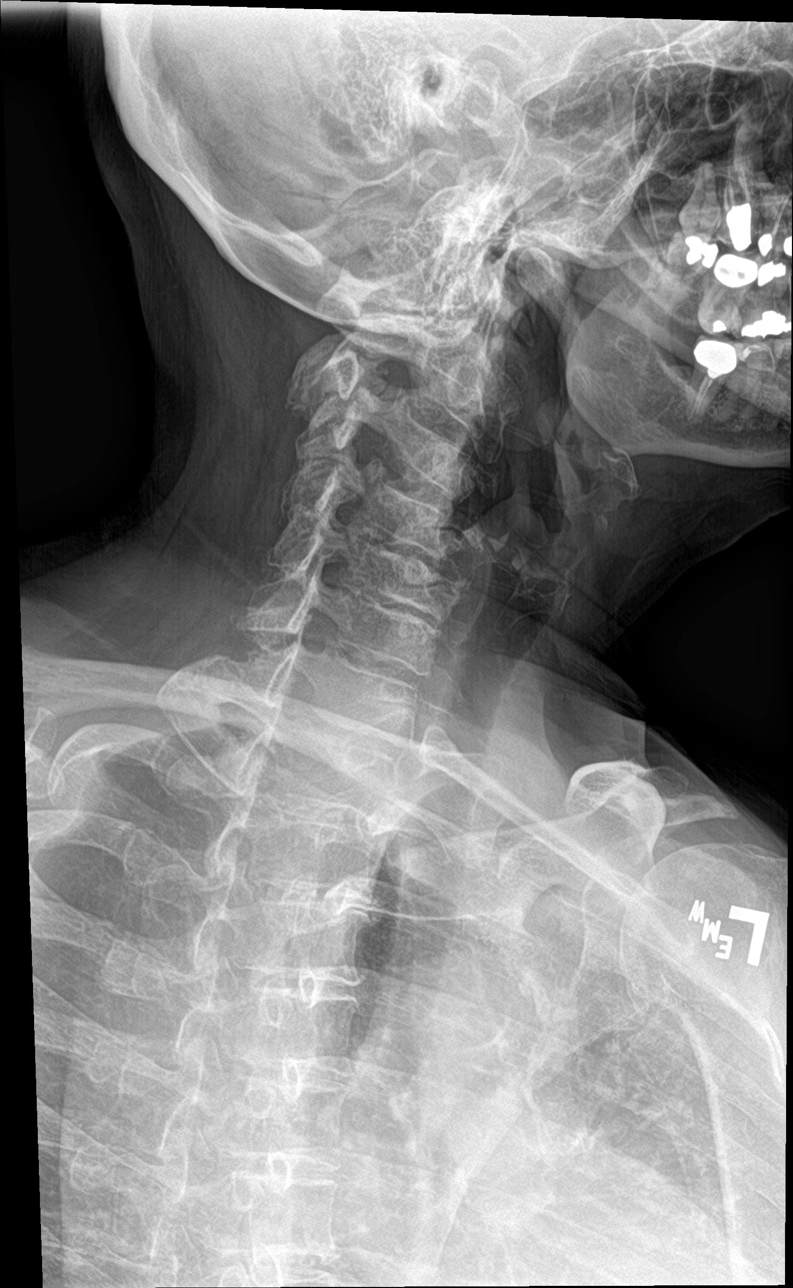

[c-spine obl (2 of 2)]
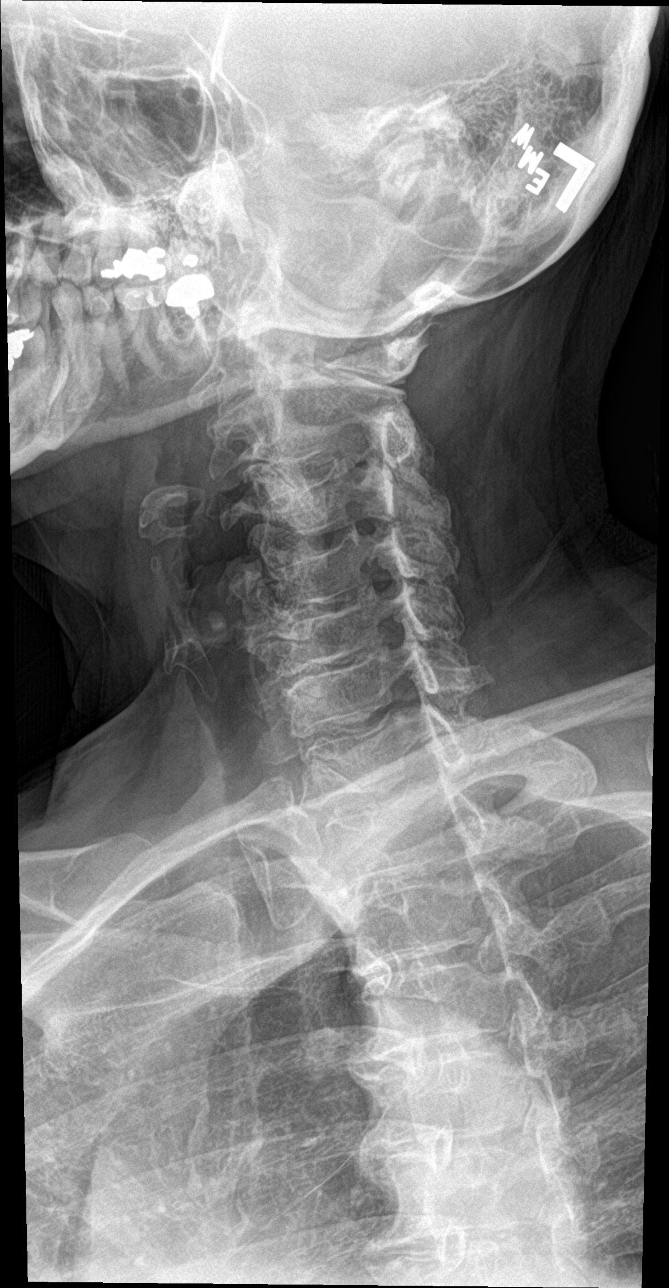

[c-spine ap]
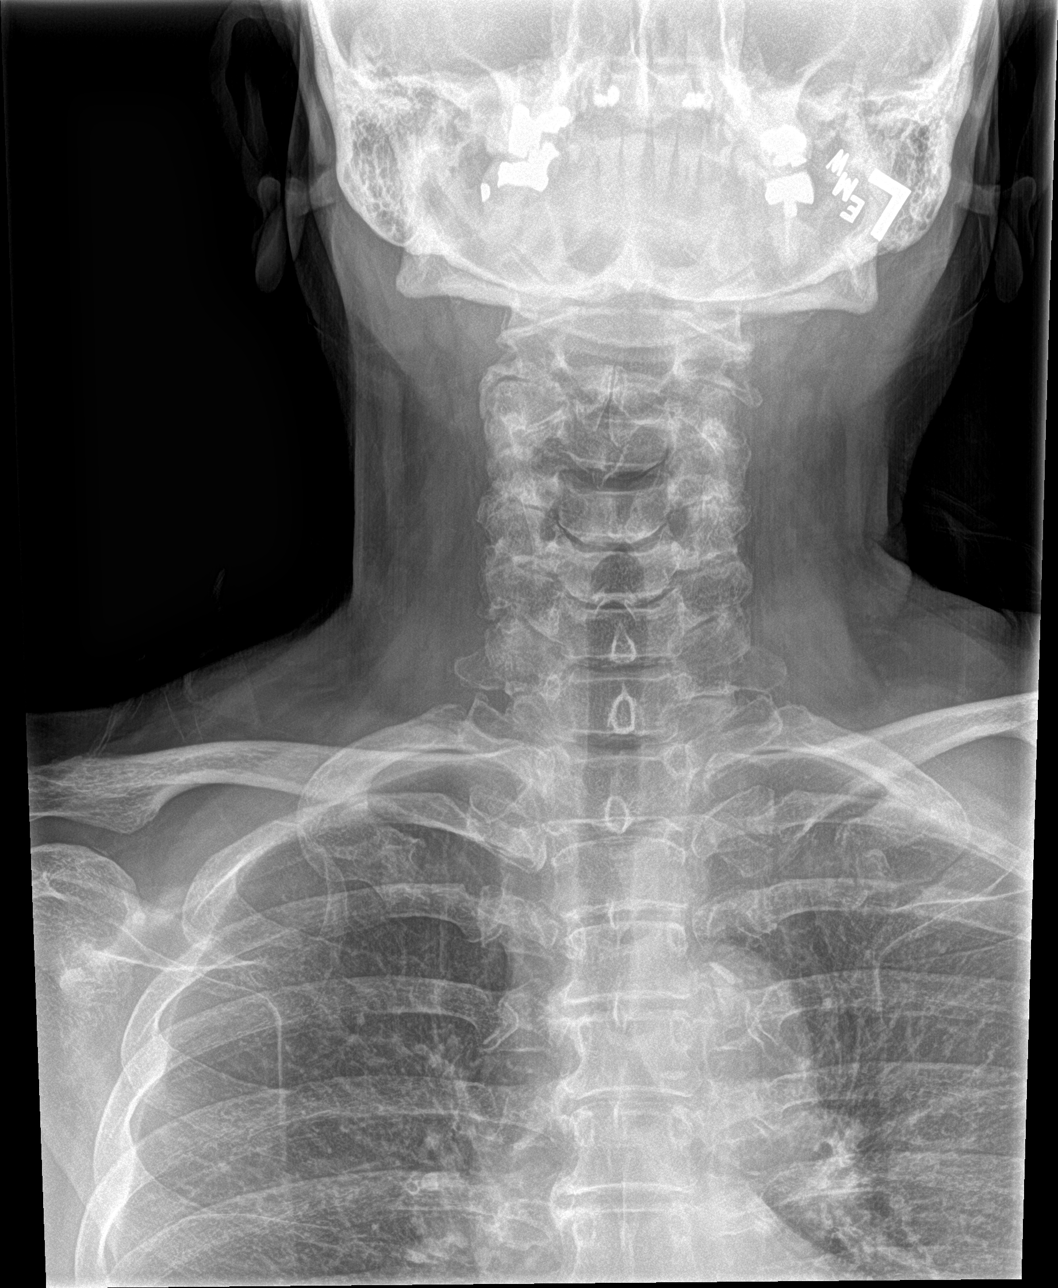

[c-spine open mouth]
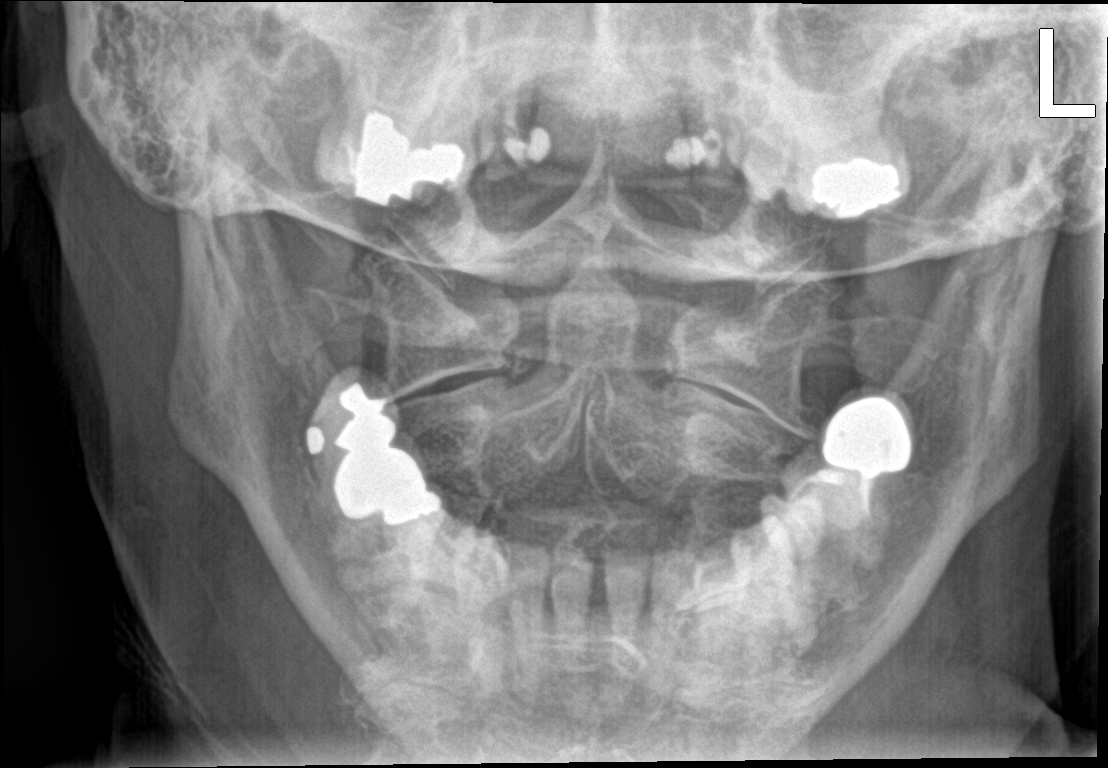

[5 of 5 positions shown; findings below may reference images not displayed]

FINDINGS: No signs of acute fracture. Mild straightening of normal cervical
lordosis. Facet degenerative changes throughout the spine with very
minimal anterolisthesis of C2 on C3 cervicothoracic junction not
well assessed on lateral view.

Uncovertebral degenerative changes seen bilaterally with neural
foraminal narrowing at C3-4 and C4-5 on the right which is moderate
and with similar pattern on the left.

Prevertebral soft tissues are normal.
IMPRESSION: Signs of cervical spine degenerative change without evidence of
acute abnormality, worse in the midcervical spine due to facet
hypertrophy and uncovertebral degenerative changes.

## 2020-08-31 IMAGING — DX DG THORACIC SPINE 2V
3 series · 3 of 3 positions shown · non-contrast
Comparison: Cervical spine of the same date.

CLINICAL DATA: Back pain left neck and shoulder pain.

EXAM:
THORACIC SPINE 2 VIEWS

[t-spine ap]
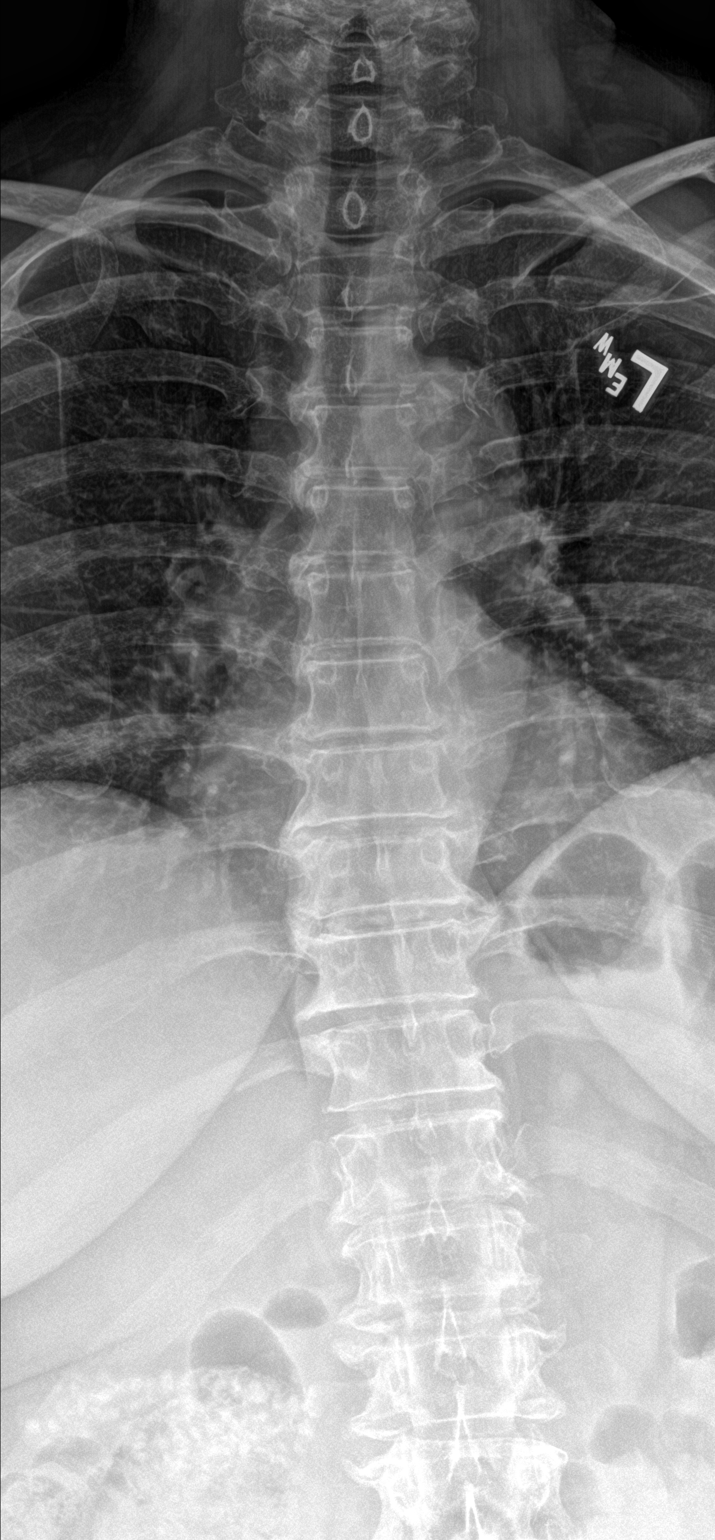

[t-spine lat]
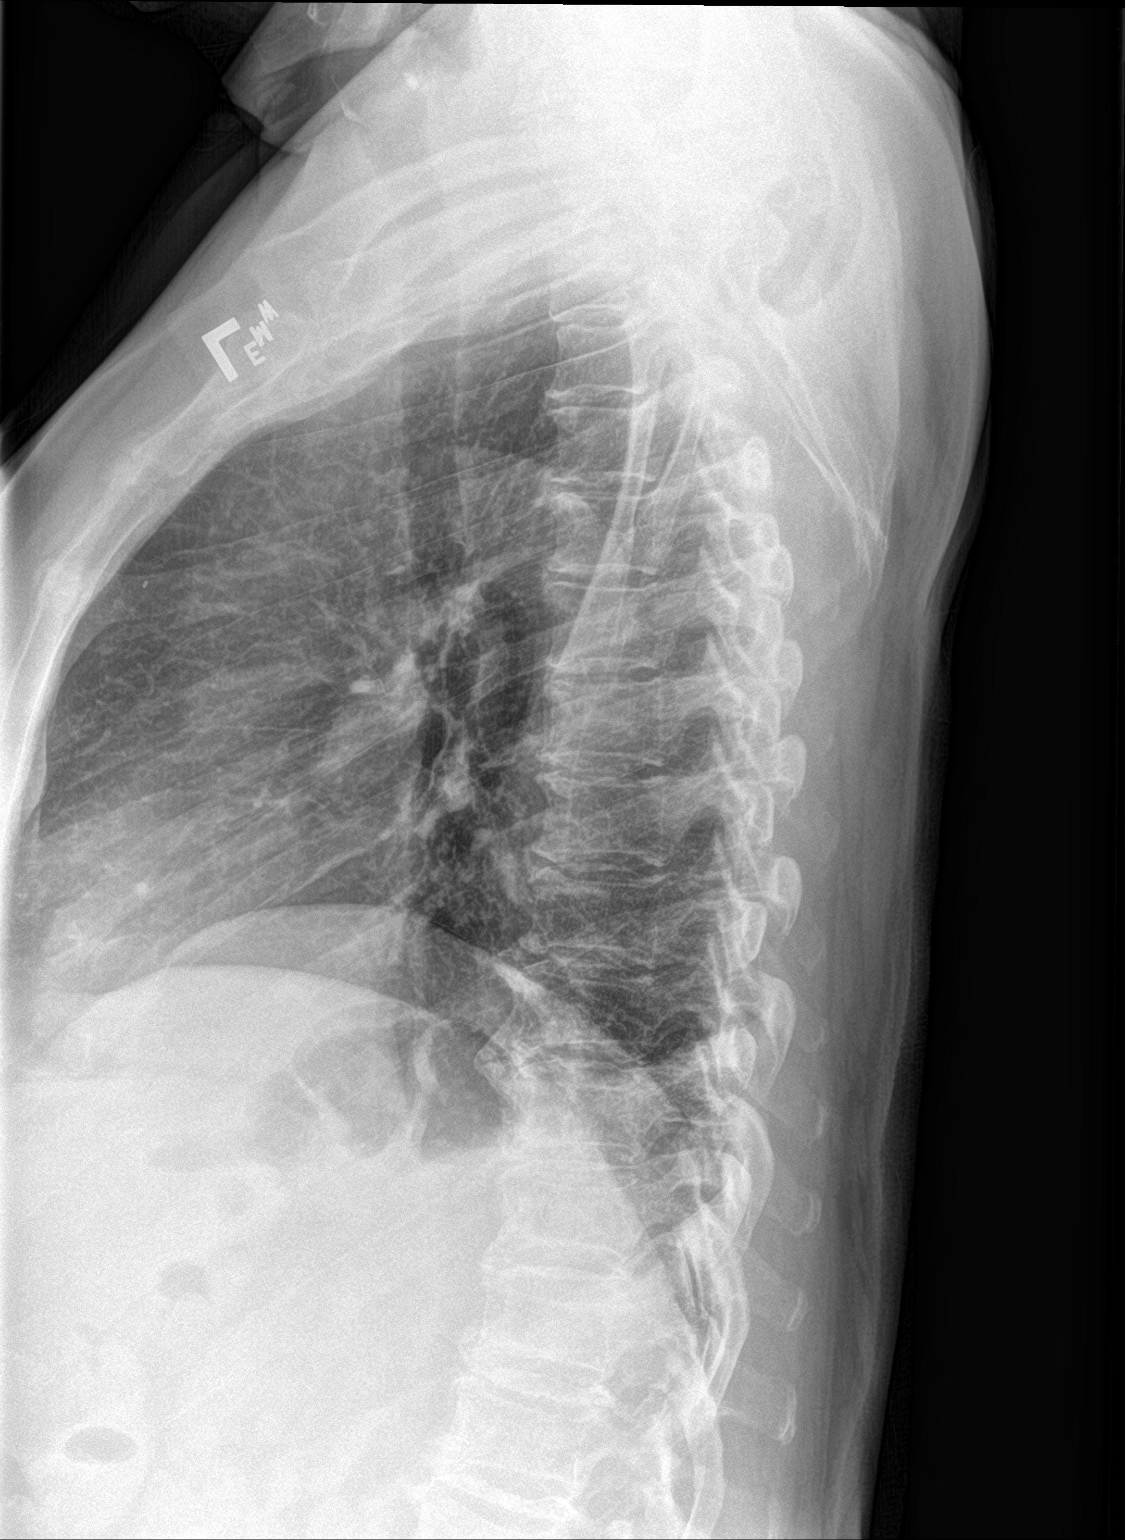

[t-spine swimmers]
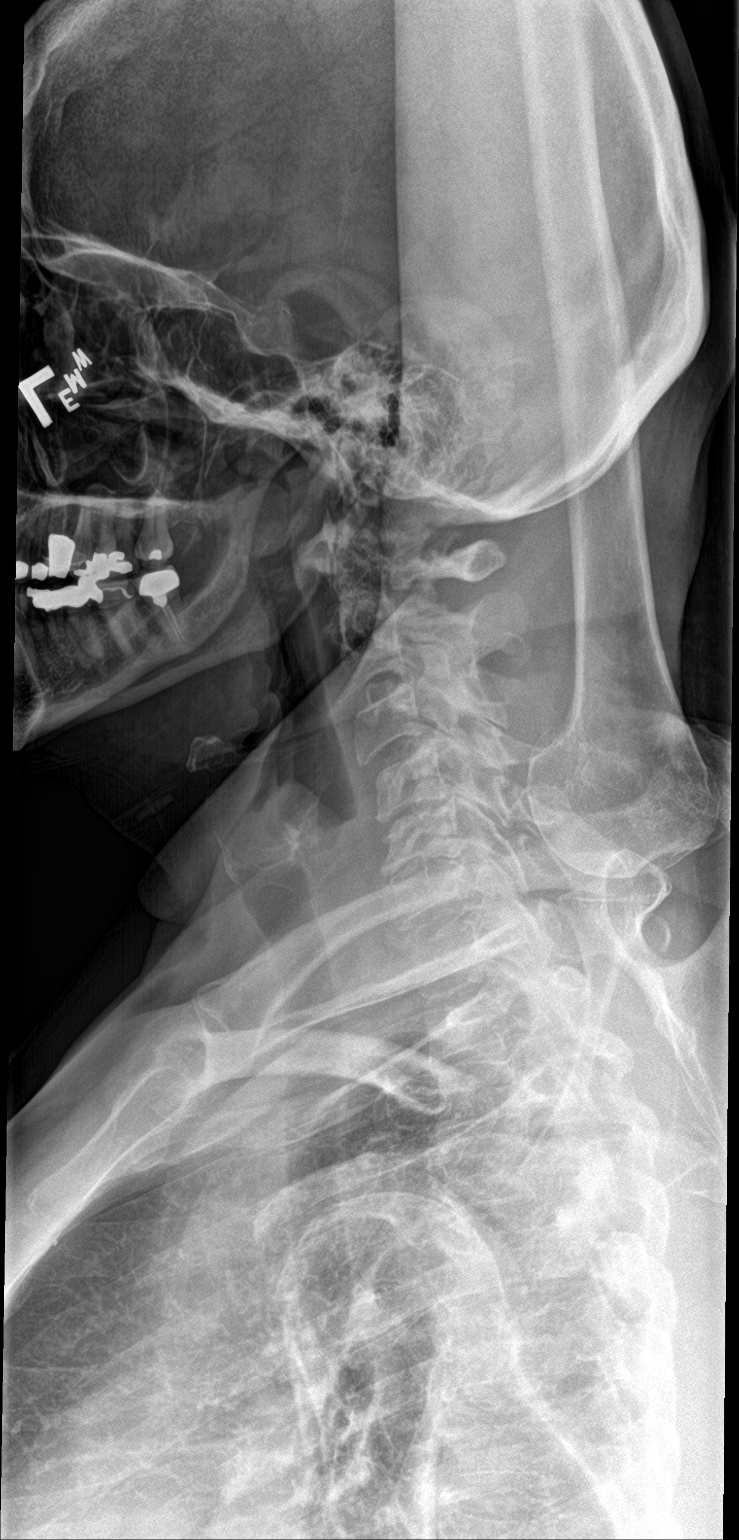

[3 of 3 positions shown; findings below may reference images not displayed]

FINDINGS: Cervicothoracic junction is unremarkable, completing lateral view of
the cervicothoracic junction from the cervical spine acquired at the
same date with the swimmer's view on the current study.

Signs of spinal degenerative change without evidence of acute
fracture or subluxation.

Increased density over right upper quadrant only partially seen on
the current radiograph with suggestion of multiple calcifications
perhaps in the gallbladder. Not seen on lateral view.
IMPRESSION: Spinal degenerative changes without signs of acute fracture or
subluxation.

Question cholelithiasis or radiopaque material overlying the
patient. If there is right upper quadrant pain or there are
abdominal symptoms dedicated abdominal plain film may be helpful.

## 2020-09-06 ENCOUNTER — Other Ambulatory Visit: Payer: Self-pay

## 2020-09-06 DIAGNOSIS — F32A Depression, unspecified: Secondary | ICD-10-CM | POA: Insufficient documentation

## 2020-09-06 DIAGNOSIS — F419 Anxiety disorder, unspecified: Secondary | ICD-10-CM | POA: Insufficient documentation

## 2020-09-06 DIAGNOSIS — E785 Hyperlipidemia, unspecified: Secondary | ICD-10-CM | POA: Insufficient documentation

## 2020-09-09 NOTE — Progress Notes (Signed)
Cardiology Office Note:    Date:  09/10/2020   ID:  Tricia Fleming, DOB 1951-09-28, MRN [id number]  PCP:  Mosie Lukes, MD  Cardiologist:  Shirlee More, MD   Referring MD: Mosie Lukes, MD  ASSESSMENT:    1. Chest pain of uncertain etiology   2. Mixed hyperlipidemia    PLAN:    In order of problems listed above:  1. Her EKG is normal she is not having chest pain or anginal symptoms I told her at this time I would not do any further diagnostic testing.  The only test I would consider would be a coronary calcium score but it is a goal of deciding whether to be on lipid-lowering therapy and she is already taking a statin. 2. Mixed HLD, stable. Continue statin therapy as prescribed.  3. Fatigue, persistent. Normal Hgb, normal TSH, normal echocardiogram for age. Patient continues to take Ambien to help with sleep. Recommend she begin tapering off of this medication as it could have lingering effects contributing to her fatigue. Low suspicion that this is due to cardiac issue given results of echo and labs.   Next appointment: PRN    Medication Adjustments/Labs and Tests Ordered: Current medicines are reviewed at length with the patient today.  Concerns regarding medicines are outlined above.  Orders Placed This Encounter  Procedures  . EKG 12-Lead   No orders of the defined types were placed in this encounter.    Chief Complaint  Patient presents with  . Chest Pain    History of Present Illness:    Tricia Fleming is a 69 y.o. female with a history of hyperlipidemia who is being seen today for the evaluation of chest pain at the request of Mosie Lukes, MD.  Chart review:  Echocardiogram 08/17/2020: Normal left ventricular size function and wall thickness EF 60 to 65% and normal diastolic filling pressure there is mild regurgitation mitral valve and otherwise normal EKG 06/06/2020: Independently reviewed sinus rhythm and was normal Ultrasound right upper quadrant  01/26/2020: Cholelithiasis and echogenic liver?  Hepatic steatosis Lipid profile 07/24/2020: Cholesterol 227 LDL 138 triglycerides 205 HDL 55 non-HDL cholesterol 172.  Patient denies any chest pain or SOB today. She reports that she has been experiencing fatigue despite having a good night's sleep while taking ambien on consistent basis. She inquires about sleep apnea but states she does not want to complete a sleep study nor use CPAP machine if indicated. Patient reports regular adherence to statin regimen and reports tolerance.   She was left unsettled about the results of her cardiac echo.  I reviewed with her that it is normal for age this diastolic filling pattern is normal in her age group and the mild degree of mitral regurgitation is seen commonly with normal valves.     Ref Range & Units 1 yr ago 2 yr ago 5 yr ago  Direct LDL mg/dL 125.0  113.0 CM  103.0 CM     Past Medical History:  Diagnosis Date  . Anxiety   . Atypical chest pain 12/08/2016  . Breast lesion 06/18/2010   Qualifier: Diagnosis of  By: Charlett Blake MD, Erline Levine    . Depression   . Depression with anxiety 06/27/2011  . Hyperglycemia 07/27/2013  . Hyperlipidemia   . Osteopenia 01/15/2016  . Preventative health care 06/27/2011  . Preventative health care 06/27/2011  . SCC (squamous cell carcinoma), leg 05/04/2014   SCC   . Shoulder pain, bilateral 02/13/2013  Past Surgical History:  Procedure Laterality Date  . BREAST BIOPSY Left    needle core biopsy, benign  . CHOLECYSTECTOMY    . TUBAL LIGATION      Current Medications: Current Meds  Medication Sig  . acetaminophen (TYLENOL) 500 MG tablet Take 500 mg by mouth 2 (two) times daily as needed.  . ALPRAZolam (XANAX) 1 MG tablet Take 1 tablet (1 mg total) by mouth 2 (two) times daily as needed for anxiety.  . Biotin 5000 MCG CAPS Take by mouth.  . fish oil-omega-3 fatty acids 1000 MG capsule Take 1,000 mg by mouth daily. 2 caps   . HYDROcodone-acetaminophen  (NORCO/VICODIN) 5-325 MG tablet Take by mouth.  . nitroGLYCERIN (NITROSTAT) 0.4 MG SL tablet Place 1 tablet (0.4 mg total) under the tongue every 5 (five) minutes as needed for chest pain.  . pantoprazole (PROTONIX) 40 MG tablet TAKE 1 TABLET BY MOUTH DAILY AS NEEDED  . rosuvastatin (CRESTOR) 40 MG tablet Take 1 tablet (40 mg total) by mouth at bedtime.  . sertraline (ZOLOFT) 100 MG tablet Take 1 tablet (100 mg total) by mouth daily.  Marland Kitchen zolpidem (AMBIEN) 10 MG tablet Take 1 tablet (10 mg total) by mouth at bedtime as needed. for sleep     Allergies:   Meloxicam   Social History   Socioeconomic History  . Marital status: Married    Spouse name: Not on file  . Number of children: Not on file  . Years of education: Not on file  . Highest education level: Not on file  Occupational History  . Not on file  Tobacco Use  . Smoking status: Never Smoker  . Smokeless tobacco: Never Used  Substance and Sexual Activity  . Alcohol use: Yes    Alcohol/week: 0.0 standard drinks    Comment: a couple beers a week  . Drug use: No  . Sexual activity: Yes    Partners: Male  Other Topics Concern  . Not on file  Social History Narrative  . Not on file   Social Determinants of Health   Financial Resource Strain:   . Difficulty of Paying Living Expenses: Not on file  Food Insecurity:   . Worried About Charity fundraiser in the Last Year: Not on file  . Ran Out of Food in the Last Year: Not on file  Transportation Needs:   . Lack of Transportation (Medical): Not on file  . Lack of Transportation (Non-Medical): Not on file  Physical Activity:   . Days of Exercise per Week: Not on file  . Minutes of Exercise per Session: Not on file  Stress:   . Feeling of Stress : Not on file  Social Connections:   . Frequency of Communication with Friends and Family: Not on file  . Frequency of Social Gatherings with Friends and Family: Not on file  . Attends Religious Services: Not on file  . Active  Member of Clubs or Organizations: Not on file  . Attends Archivist Meetings: Not on file  . Marital Status: Not on file     Family History: The patient's family history includes Alcohol abuse in her brother; Arthritis in her sister; Benign prostatic hyperplasia in her brother and brother; Birth defects in her paternal aunt; Cancer in her father and maternal aunt; Cirrhosis in her brother; Coronary artery disease in her maternal grandmother and mother; Diabetes in her maternal grandfather, maternal grandmother, and mother; Esophageal varices in her brother; Heart disease in her brother;  Hypertension in her mother and sister; Insulin resistance in her brother; Lung disease in her brother; Transient ischemic attack in her sister.  ROS:   ROS Please see the history of present illness.     All other systems reviewed and are negative.  EKGs/Labs/Other Studies Reviewed:    The following studies were reviewed today: Hemoglobin, TSH and Cr reviewed below   EKG:  EKG is ordered today.  The ekg ordered today is personally reviewed and demonstrates NSR with LVH   Recent Labs: 07/24/2020: ALT 37; BUN 19; Creat 0.80; Hemoglobin 14.8; Platelets 220; Potassium 4.9; Sodium 138; TSH 2.33  Recent Lipid Panel    Component Value Date/Time   CHOL 227 (H) 07/24/2020 1027   TRIG 205 (H) 07/24/2020 1027   HDL 55 07/24/2020 1027   CHOLHDL 4.1 07/24/2020 1027   VLDL 48.4 (H) 03/15/2020 0751   LDLCALC 138 (H) 07/24/2020 1027   LDLDIRECT 114.0 03/15/2020 0751    Physical Exam:    VS:  BP 138/88 (BP Location: Right Arm)   Pulse 85   Ht 5\' 2"  (1.575 m)   Wt 163 lb 1.6 oz (74 kg)   SpO2 97%   BMI 29.83 kg/m     Wt Readings from Last 3 Encounters:  09/10/20 163 lb 1.6 oz (74 kg)  07/24/20 161 lb (73 kg)  06/06/20 159 lb (72.1 kg)     GEN:  Well nourished, well developed in no acute distress, mild tremor in upper extremities, slightly anxious appearing  HEENT: Normal NECK: No JVD; No  carotid bruits LYMPHATICS: No lymphadenopathy CARDIAC: RRR, no murmurs, rubs, gallops RESPIRATORY:  Clear to auscultation without rales, wheezing or rhonchi  ABDOMEN: Soft, non-tender, non-distended MUSCULOSKELETAL:  No edema; No deformity  SKIN: Warm and dry NEUROLOGIC:  Alert and oriented x 3 PSYCHIATRIC:  Normal affect   Signed, Shirlee More, MD  09/10/2020 11:04 AM    [place]

## 2020-09-10 ENCOUNTER — Encounter: Payer: Self-pay | Admitting: Cardiology

## 2020-09-10 ENCOUNTER — Ambulatory Visit: Payer: Medicare HMO | Admitting: Cardiology

## 2020-09-10 ENCOUNTER — Other Ambulatory Visit: Payer: Self-pay

## 2020-09-10 VITALS — BP 138/88 | HR 85 | Ht 62.0 in | Wt 163.1 lb

## 2020-09-10 DIAGNOSIS — R079 Chest pain, unspecified: Secondary | ICD-10-CM | POA: Diagnosis not present

## 2020-09-10 DIAGNOSIS — E782 Mixed hyperlipidemia: Secondary | ICD-10-CM

## 2020-09-10 NOTE — Patient Instructions (Signed)

## 2020-09-17 DIAGNOSIS — Z9889 Other specified postprocedural states: Secondary | ICD-10-CM | POA: Diagnosis not present

## 2020-09-17 DIAGNOSIS — M65311 Trigger thumb, right thumb: Secondary | ICD-10-CM | POA: Diagnosis not present

## 2020-09-17 DIAGNOSIS — G5603 Carpal tunnel syndrome, bilateral upper limbs: Secondary | ICD-10-CM | POA: Diagnosis not present

## 2020-09-17 DIAGNOSIS — M65312 Trigger thumb, left thumb: Secondary | ICD-10-CM | POA: Diagnosis not present

## 2020-09-27 DIAGNOSIS — Z8249 Family history of ischemic heart disease and other diseases of the circulatory system: Secondary | ICD-10-CM | POA: Diagnosis not present

## 2020-09-27 DIAGNOSIS — R030 Elevated blood-pressure reading, without diagnosis of hypertension: Secondary | ICD-10-CM | POA: Diagnosis not present

## 2020-09-27 DIAGNOSIS — Z809 Family history of malignant neoplasm, unspecified: Secondary | ICD-10-CM | POA: Diagnosis not present

## 2020-09-27 DIAGNOSIS — Z823 Family history of stroke: Secondary | ICD-10-CM | POA: Diagnosis not present

## 2020-09-27 DIAGNOSIS — E785 Hyperlipidemia, unspecified: Secondary | ICD-10-CM | POA: Diagnosis not present

## 2020-09-27 DIAGNOSIS — Z833 Family history of diabetes mellitus: Secondary | ICD-10-CM | POA: Diagnosis not present

## 2020-09-27 DIAGNOSIS — F419 Anxiety disorder, unspecified: Secondary | ICD-10-CM | POA: Diagnosis not present

## 2020-09-27 DIAGNOSIS — R69 Illness, unspecified: Secondary | ICD-10-CM | POA: Diagnosis not present

## 2020-09-27 DIAGNOSIS — M1990 Unspecified osteoarthritis, unspecified site: Secondary | ICD-10-CM | POA: Diagnosis not present

## 2020-09-27 DIAGNOSIS — G4700 Insomnia, unspecified: Secondary | ICD-10-CM | POA: Diagnosis not present

## 2020-10-29 ENCOUNTER — Other Ambulatory Visit: Payer: Self-pay | Admitting: Family Medicine

## 2020-10-29 DIAGNOSIS — R739 Hyperglycemia, unspecified: Secondary | ICD-10-CM

## 2020-10-29 DIAGNOSIS — M8580 Other specified disorders of bone density and structure, unspecified site: Secondary | ICD-10-CM

## 2020-10-29 DIAGNOSIS — Z23 Encounter for immunization: Secondary | ICD-10-CM

## 2020-10-29 DIAGNOSIS — R030 Elevated blood-pressure reading, without diagnosis of hypertension: Secondary | ICD-10-CM

## 2020-10-29 DIAGNOSIS — E782 Mixed hyperlipidemia: Secondary | ICD-10-CM

## 2020-10-29 NOTE — Telephone Encounter (Signed)
Requesting: Ambien 10mg  and alprazolam 1mg  Contract: 01/28/2018 UDS: 08/05/2018 Last Visit: 07/24/2020 Next Visit: 11/20/2020 Last Refill on Ambien: 03/09/2020 #90 and 1RF Last Refill on alprazolam :03/09/2020 #180 and 1RF  Please Advise

## 2020-11-02 ENCOUNTER — Telehealth: Payer: Self-pay

## 2020-11-02 NOTE — Telephone Encounter (Signed)
PA initiated via Covermymeds; KEY: [id number]. Awaiting determination.

## 2020-11-02 NOTE — Telephone Encounter (Signed)
PA approved. Effective 10/27/2020 to 10/26/2021.  

## 2020-11-20 ENCOUNTER — Other Ambulatory Visit: Payer: Self-pay

## 2020-11-20 ENCOUNTER — Ambulatory Visit (INDEPENDENT_AMBULATORY_CARE_PROVIDER_SITE_OTHER): Payer: Medicare HMO | Admitting: Family Medicine

## 2020-11-20 ENCOUNTER — Encounter: Payer: Self-pay | Admitting: Family Medicine

## 2020-11-20 ENCOUNTER — Telehealth: Payer: Self-pay | Admitting: Family Medicine

## 2020-11-20 VITALS — BP 132/78 | HR 103 | Temp 98.5°F | Resp 16 | Wt 167.2 lb

## 2020-11-20 DIAGNOSIS — E785 Hyperlipidemia, unspecified: Secondary | ICD-10-CM | POA: Diagnosis not present

## 2020-11-20 DIAGNOSIS — R739 Hyperglycemia, unspecified: Secondary | ICD-10-CM

## 2020-11-20 DIAGNOSIS — R69 Illness, unspecified: Secondary | ICD-10-CM | POA: Diagnosis not present

## 2020-11-20 DIAGNOSIS — M791 Myalgia, unspecified site: Secondary | ICD-10-CM | POA: Diagnosis not present

## 2020-11-20 DIAGNOSIS — E782 Mixed hyperlipidemia: Secondary | ICD-10-CM | POA: Diagnosis not present

## 2020-11-20 DIAGNOSIS — E538 Deficiency of other specified B group vitamins: Secondary | ICD-10-CM

## 2020-11-20 DIAGNOSIS — F418 Other specified anxiety disorders: Secondary | ICD-10-CM

## 2020-11-20 LAB — COMPREHENSIVE METABOLIC PANEL
ALT: 74 U/L — ABNORMAL HIGH (ref 0–35)
AST: 41 U/L — ABNORMAL HIGH (ref 0–37)
Albumin: 5 g/dL (ref 3.5–5.2)
Alkaline Phosphatase: 58 U/L (ref 39–117)
BUN: 16 mg/dL (ref 6–23)
CO2: 34 mEq/L — ABNORMAL HIGH (ref 19–32)
Calcium: 10.2 mg/dL (ref 8.4–10.5)
Chloride: 99 mEq/L (ref 96–112)
Creatinine, Ser: 0.78 mg/dL (ref 0.40–1.20)
GFR: 76.91 mL/min (ref >60.00)
Glucose, Bld: 105 mg/dL — ABNORMAL HIGH (ref 70–99)
Potassium: 4.9 mEq/L (ref 3.5–5.1)
Sodium: 140 mEq/L (ref 135–145)
Total Bilirubin: 0.5 mg/dL (ref 0.2–1.2)
Total Protein: 7.3 g/dL (ref 6.0–8.3)

## 2020-11-20 LAB — VITAMIN B12: Vitamin B-12: 432 pg/mL (ref 211–911)

## 2020-11-20 LAB — LIPID PANEL
Cholesterol: 137 mg/dL (ref 0–200)
HDL: 45.7 mg/dL (ref >39.00)
NonHDL: 91.57
Total CHOL/HDL Ratio: 3
Triglycerides: 225 mg/dL — ABNORMAL HIGH (ref 0.0–149.0)
VLDL: 45 mg/dL — ABNORMAL HIGH (ref 0.0–40.0)

## 2020-11-20 LAB — CBC
HCT: 42.1 % (ref 36.0–46.0)
Hemoglobin: 14.1 g/dL (ref 12.0–15.0)
MCHC: 33.6 g/dL (ref 30.0–36.0)
MCV: 86.3 fl (ref 78.0–100.0)
Platelets: 208 10*3/uL (ref 150.0–400.0)
RBC: 4.88 Mil/uL (ref 3.87–5.11)
RDW: 13.5 % (ref 11.5–15.5)
WBC: 5.1 10*3/uL (ref 4.0–10.5)

## 2020-11-20 LAB — SEDIMENTATION RATE: Sed Rate: 7 mm/hr (ref 0–30)

## 2020-11-20 LAB — HEMOGLOBIN A1C: Hgb A1c MFr Bld: 6.3 % (ref 4.6–6.5)

## 2020-11-20 LAB — LDL CHOLESTEROL, DIRECT: Direct LDL: 68 mg/dL

## 2020-11-20 LAB — TSH: TSH: 2.43 u[IU]/mL (ref 0.35–4.50)

## 2020-11-20 LAB — MAGNESIUM: Magnesium: 2.1 mg/dL (ref 1.5–2.5)

## 2020-11-20 NOTE — Telephone Encounter (Signed)
Refills sent on 10/29/2020    zolpidem (AMBIEN) 10 MG tablet 90 tablet 2 10/29/2020    Sig - Route: TAKE 1 TABLET (10 MG TOTAL) BY MOUTH AT BEDTIME AS NEEDED. FOR SLEEP - Oral   Sent to pharmacy as: zolpidem (AMBIEN) 10 MG tablet   Notes to Pharmacy: This request is for a new prescription for a controlled substance as required by Federal/State law.   E-Prescribing Status: Receipt confirmed by pharmacy (10/29/2020 4:31 PM EST)

## 2020-11-20 NOTE — Patient Instructions (Addendum)
Magnesium Glycinate 400 mg daily at bedtime,   [employer] daily Muscle Pain, Adult Muscle pain, also called myalgia, is a condition in which a person has pain in one or more muscles in the body. Muscle pain may be mild, moderate, or severe. It may feel sharp, achy, or burning. In most cases, the pain lasts only a short time and goes away without treatment. Muscle pain can result from using muscles in a new or different way or after a period of inactivity. It is normal to feel some muscle pain after starting an exercise program. Muscles that have not been used often will be sore at first. What are the causes? This condition is caused by using muscles in a new or different way after a period of inactivity. Other causes may include:  Overuse or muscle strain, especially if you are not in shape. This is the most common cause of muscle pain.  Injury or bruising.  Infectious diseases, including diseases caused by viruses, such as the flu (influenza).  Fibromyalgia.This is a long-term, or chronic, condition that causes muscle tenderness, tiredness (fatigue), and headache.  Autoimmune or rheumatologic diseases. These are conditions, such as lupus, in which the body's defense system (immunesystem) attacks areas in the body.  Certain medicines, including ACE inhibitors and statins. What are the signs or symptoms? The main symptom of this condition is sore or painful muscles, including during activity and when stretching. You may also have slight swelling. How is this diagnosed? This condition is diagnosed with a physical exam. Your health care provider will ask questions about your pain and when it began. If you have not had muscle pain for very long, your health care provider may want to wait before doing much testing. If your muscle pain has lasted a long time, tests may be done right away. In some cases, this may include tests to rule out certain conditions or illnesses. How is this  treated? Treatment for this condition depends on the cause. Home care is often enough to relieve muscle pain. Your health care provider may also prescribe NSAIDs, such as ibuprofen. Follow these instructions at home: Medicines  Take over-the-counter and prescription medicines only as told by your health care provider.  Ask your health care provider if the medicine prescribed to you requires you to avoid driving or using machinery. Managing pain, swelling, and discomfort  If directed, put ice on the painful area. To do this: ? Put ice in a plastic bag. ? Place a towel between your skin and the bag. ? Leave the ice on for 20 minutes, 2-3 times a day.  For the first 2 days of muscle soreness, or if there is swelling: ? Do not soak in hot baths. ? Do not use a hot tub, steam room, sauna, heating pad, or other heat source.  After 48-72 hours, you may alternate between applying ice and applying heat as told by your health care provider. If directed, apply heat to the affected area as often as told by your health care provider. Use the heat source that your health care provider recommends, such as a moist heat pack or a heating pad. ? Place a towel between your skin and the heat source. ? Leave the heat on for 20-30 minutes. ? Remove the heat if your skin turns bright red. This is especially important if you are unable to feel pain, heat, or cold. You may have a greater risk of getting burned.  If you have an injury,  raise (elevate) the injured area above the level of your heart while you are sitting or lying down.      Activity  If overuse is causing your muscle pain: ? Slow down your activities until the pain goes away. ? Do regular, gentle exercises if you are not usually active. ? Warm up before exercising. Stretch before and after exercising. This can help lower the risk of muscle pain.  Do not continue working out if the pain is severe. Severe pain could mean that you have injured a  muscle.  Do not lift anything that is heavier than 5-10 lb (2.3-4.5 kg), or the limit that you are told, until your health care provider says that it is safe.  Return to your normal activities as told by your health care provider. Ask your health care provider what activities are safe for you.   General instructions  Do not use any products that contain nicotine or tobacco, such as cigarettes, e-cigarettes, and chewing tobacco. These can delay healing. If you need help quitting, ask your health care provider.  Keep all follow-up visits as told by your health care provider. This is important. Contact a health care provider if you have:  Muscle pain that gets worse and medicines do not help.  Muscle pain that lasts longer than 3 days.  A rash or fever along with muscle pain.  Muscle pain after a tick bite.  Muscle pain while working out, even though you are in good physical condition.  Redness, soreness, or swelling along with muscle pain.  Muscle pain after starting a new medicine or changing the dose of a medicine. Get help right away if you have:  Trouble breathing.  Trouble swallowing.  Muscle pain along with a stiff neck, fever, and vomiting.  Severe muscle weakness or you cannot move part of your body. These symptoms may represent a serious problem that is an emergency. Do not wait to see if the symptoms will go away. Get medical help right away. Call your local emergency services (911 in the [place] Do not drive yourself to the hospital. Summary  Muscle pain usually lasts only a short time and goes away without treatment.  This condition is caused by using muscles in a new or different way after a period of inactivity.  If your muscle pain lasts longer than 3 days, tell your health care provider. This information is not intended to replace advice given to you by your health care provider. Make sure you discuss any questions you have with your health care  provider. Document Revised: 07/22/2019 Document Reviewed: 07/22/2019 Elsevier Patient Education  2021 [employer].

## 2020-11-20 NOTE — Telephone Encounter (Signed)
Medication:zolpidem (AMBIEN) 10 MG tablet [[id number]      Has the patient contacted their pharmacy?  (If no, request that the patient contact the pharmacy for the refill.) (If yes, when and what did the pharmacy advise?)     Preferred Pharmacy (with phone number or street name):   [id number] - [place], [address]  [address] [place] [address]  Phone:  [phone] Fax:  [phone]  DEA #:    Agent: Please be advised that RX refills may take up to 3 business days. We ask that you follow-up with your pharmacy.

## 2020-11-21 DIAGNOSIS — M791 Myalgia, unspecified site: Secondary | ICD-10-CM | POA: Insufficient documentation

## 2020-11-21 NOTE — Assessment & Plan Note (Signed)
>>  ASSESSMENT AND PLAN FOR MIXED DIABETIC HYPERLIPIDEMIA ASSOCIATED WITH TYPE 2 DIABETES MELLITUS (HCC) WRITTEN ON 11/21/2020 12:45 PM BY BLYTH, STACEY A, MD  hgba1c acceptable, minimize simple carbs. Increase exercise as tolerated.

## 2020-11-21 NOTE — Assessment & Plan Note (Signed)
Will be holding her statin for the next month if no improvement in her pain can restart it. If pain improves may consider restarting at lower dose. Encouraged heart healthy diet, increase exercise, avoid trans fats, consider a krill oil cap daily

## 2020-11-21 NOTE — Assessment & Plan Note (Signed)
Continues to struggle with fatigue but feels her mood is improved with sertraline at 100 mg daily no changes for now

## 2020-11-21 NOTE — Assessment & Plan Note (Signed)
hgba1c acceptable, minimize simple carbs. Increase exercise as tolerated.  

## 2020-11-21 NOTE — Assessment & Plan Note (Signed)
Supplement and monitor 

## 2020-11-21 NOTE — Assessment & Plan Note (Signed)
Notes myalgias and arthralgias worsening over the past few months. Will hold statin for the next month and she is encouraged to hydrate, rest, exercise, eat a low infection diet, try topical treatments, check labs and notify us if pain does not improve.

## 2020-11-21 NOTE — Progress Notes (Signed)
Subjective:    Patient ID: Tricia Fleming, female    DOB: [birth year], 70 y.o.   MRN: [id number]  Chief Complaint  Patient presents with  . Follow-up    Pt states that she having body and joint aches.    HPI Patient is in today for follow up on chronic medical concerns. No recent febrile illness or hospitalizations. She is struggling with increasing pain myalgias and arthralgias diffusely. Her right thumb is worsening, her hands, hips, shoulders and more hurt daily. She also still endorses fatigue but feels her mood is improved some. Denies CP/palp/SOB/HA/congestion/fevers/GI or GU c/o. Taking meds as prescribed  Past Medical History:  Diagnosis Date  . Anxiety   . Atypical chest pain 12/08/2016  . Breast lesion 06/18/2010   Qualifier: Diagnosis of  By: Charlett Blake MD, Erline Levine    . Depression   . Depression with anxiety 06/27/2011  . Hyperglycemia 07/27/2013  . Hyperlipidemia   . Osteopenia 01/15/2016  . Preventative health care 06/27/2011  . Preventative health care 06/27/2011  . SCC (squamous cell carcinoma), leg 05/04/2014   SCC   . Shoulder pain, bilateral 02/13/2013    Past Surgical History:  Procedure Laterality Date  . BREAST BIOPSY Left    needle core biopsy, benign  . CHOLECYSTECTOMY    . TUBAL LIGATION      Family History  Problem Relation Age of Onset  . Cancer Father        metastic prostate  . Diabetes Mother   . Hypertension Mother   . Coronary artery disease Mother   . Transient ischemic attack Sister   . Heart disease Brother   . Benign prostatic hyperplasia Brother   . Lung disease Brother        in a smoker  . Insulin resistance Brother   . Hypertension Sister   . Benign prostatic hyperplasia Brother   . Alcohol abuse Brother   . Arthritis Sister   . Diabetes Maternal Grandmother   . Coronary artery disease Maternal Grandmother   . Diabetes Maternal Grandfather   . Esophageal varices Brother   . Cirrhosis Brother   . Cancer Maternal Aunt   . Birth  defects Paternal Aunt        breast    Social History   Socioeconomic History  . Marital status: Married    Spouse name: Not on file  . Number of children: Not on file  . Years of education: Not on file  . Highest education level: Not on file  Occupational History  . Not on file  Tobacco Use  . Smoking status: Never Smoker  . Smokeless tobacco: Never Used  Substance and Sexual Activity  . Alcohol use: Yes    Alcohol/week: 0.0 standard drinks    Comment: a couple beers a week  . Drug use: No  . Sexual activity: Yes    Partners: Male  Other Topics Concern  . Not on file  Social History Narrative  . Not on file   Social Determinants of Health   Financial Resource Strain: Not on file  Food Insecurity: Not on file  Transportation Needs: Not on file  Physical Activity: Not on file  Stress: Not on file  Social Connections: Not on file  Intimate Partner Violence: Not on file    Outpatient Medications Prior to Visit  Medication Sig Dispense Refill  . acetaminophen (TYLENOL) 500 MG tablet Take 500 mg by mouth 2 (two) times daily as needed.    . ALPRAZolam Duanne Moron)  1 MG tablet TAKE 1 TABLET (1 MG TOTAL) BY MOUTH 2 (TWO) TIMES DAILY AS NEEDED FOR ANXIETY. 180 tablet 2  . Biotin 5000 MCG CAPS Take by mouth.    . fish oil-omega-3 fatty acids 1000 MG capsule Take 1,000 mg by mouth daily. 2 caps    . HYDROcodone-acetaminophen (NORCO/VICODIN) 5-325 MG tablet Take by mouth.    . nitroGLYCERIN (NITROSTAT) 0.4 MG SL tablet Place 1 tablet (0.4 mg total) under the tongue every 5 (five) minutes as needed for chest pain. 25 tablet 1  . pantoprazole (PROTONIX) 40 MG tablet TAKE 1 TABLET BY MOUTH DAILY AS NEEDED 90 tablet 1  . rosuvastatin (CRESTOR) 40 MG tablet Take 1 tablet (40 mg total) by mouth at bedtime. 90 tablet 1  . sertraline (ZOLOFT) 100 MG tablet Take 1 tablet (100 mg total) by mouth daily. 30 tablet 2  . zolpidem (AMBIEN) 10 MG tablet TAKE 1 TABLET (10 MG TOTAL) BY MOUTH AT  BEDTIME AS NEEDED. FOR SLEEP 90 tablet 2   No facility-administered medications prior to visit.    Allergies  Allergen Reactions  . Meloxicam Other (See Comments)    "HEADACHE, POOR APPETITE, AND ANXIOUS"    Review of Systems  Constitutional: Positive for malaise/fatigue. Negative for fever.  HENT: Negative for congestion.   Eyes: Negative for blurred vision.  Respiratory: Negative for shortness of breath.   Cardiovascular: Negative for chest pain, palpitations and leg swelling.  Gastrointestinal: Negative for abdominal pain, blood in stool and nausea.  Genitourinary: Negative for dysuria and frequency.  Musculoskeletal: Positive for joint pain and myalgias. Negative for falls.  Skin: Negative for rash.  Neurological: Negative for dizziness, loss of consciousness and headaches.  Endo/Heme/Allergies: Negative for environmental allergies.  Psychiatric/Behavioral: Negative for depression. The patient is not nervous/anxious.        Objective:    Physical Exam Vitals and nursing note reviewed.  Constitutional:      General: She is not in acute distress.    Appearance: She is well-developed and well-nourished.  HENT:     Head: Normocephalic and atraumatic.     Nose: Nose normal.  Eyes:     General:        Right eye: No discharge.        Left eye: No discharge.  Cardiovascular:     Rate and Rhythm: Normal rate and regular rhythm.     Heart sounds: No murmur heard.   Pulmonary:     Effort: Pulmonary effort is normal.     Breath sounds: Normal breath sounds.  Abdominal:     General: Bowel sounds are normal.     Palpations: Abdomen is soft.     Tenderness: There is no abdominal tenderness.  Musculoskeletal:        General: No edema.     Cervical back: Normal range of motion and neck supple.  Skin:    General: Skin is warm and dry.  Neurological:     Mental Status: She is alert and oriented to person, place, and time.  Psychiatric:        Mood and Affect: Mood and  affect normal.     BP 132/78   Pulse (!) 103   Temp 98.5 F (36.9 C) (Oral)   Resp 16   Wt 167 lb 3.2 oz (75.8 kg)   SpO2 98%   BMI 30.58 kg/m  Wt Readings from Last 3 Encounters:  11/20/20 167 lb 3.2 oz (75.8 kg)  09/10/20 163 lb 1.6  oz (74 kg)  07/24/20 161 lb (73 kg)    Diabetic Foot Exam - Simple   No data filed    Lab Results  Component Value Date   WBC 5.1 11/20/2020   HGB 14.1 11/20/2020   HCT 42.1 11/20/2020   PLT 208.0 11/20/2020   GLUCOSE 105 (H) 11/20/2020   CHOL 137 11/20/2020   TRIG 225.0 (H) 11/20/2020   HDL 45.70 11/20/2020   LDLDIRECT 68.0 11/20/2020   LDLCALC 138 (H) 07/24/2020   ALT 74 (H) 11/20/2020   AST 41 (H) 11/20/2020   NA 140 11/20/2020   K 4.9 11/20/2020   CL 99 11/20/2020   CREATININE 0.78 11/20/2020   BUN 16 11/20/2020   CO2 34 (H) 11/20/2020   TSH 2.43 11/20/2020   HGBA1C 6.3 11/20/2020   MICROALBUR <0.7 07/06/2015    Lab Results  Component Value Date   TSH 2.43 11/20/2020   Lab Results  Component Value Date   WBC 5.1 11/20/2020   HGB 14.1 11/20/2020   HCT 42.1 11/20/2020   MCV 86.3 11/20/2020   PLT 208.0 11/20/2020   Lab Results  Component Value Date   NA 140 11/20/2020   K 4.9 11/20/2020   CO2 34 (H) 11/20/2020   GLUCOSE 105 (H) 11/20/2020   BUN 16 11/20/2020   CREATININE 0.78 11/20/2020   BILITOT 0.5 11/20/2020   ALKPHOS 58 11/20/2020   AST 41 (H) 11/20/2020   ALT 74 (H) 11/20/2020   PROT 7.3 11/20/2020   ALBUMIN 5.0 11/20/2020   CALCIUM 10.2 11/20/2020   GFR 76.91 11/20/2020   Lab Results  Component Value Date   CHOL 137 11/20/2020   Lab Results  Component Value Date   HDL 45.70 11/20/2020   Lab Results  Component Value Date   LDLCALC 138 (H) 07/24/2020   Lab Results  Component Value Date   TRIG 225.0 (H) 11/20/2020   Lab Results  Component Value Date   CHOLHDL 3 11/20/2020   Lab Results  Component Value Date   HGBA1C 6.3 11/20/2020       Assessment & Plan:   Problem List Items  Addressed This Visit    RESOLVED: Mixed hyperlipidemia - Primary   Relevant Orders   Hemoglobin A1c (Completed)   Depression with anxiety    Continues to struggle with fatigue but feels her mood is improved with sertraline at 100 mg daily no changes for now      Hyperglycemia    hgba1c acceptable, minimize simple carbs. Increase exercise as tolerated.       Relevant Orders   Hemoglobin A1c (Completed)   Comprehensive metabolic panel (Completed)   TSH (Completed)   Low vitamin B12 level    Supplement and monitor      Relevant Orders   Hemoglobin A1c (Completed)   CBC (Completed)   Vitamin B12 (Completed)   Hyperlipidemia    Will be holding her statin for the next month if no improvement in her pain can restart it. If pain improves may consider restarting at lower dose. Encouraged heart healthy diet, increase exercise, avoid trans fats, consider a krill oil cap daily      Relevant Orders   Hemoglobin A1c (Completed)   Comprehensive metabolic panel (Completed)   Lipid panel (Completed)   TSH (Completed)   Myalgia    Notes myalgias and arthralgias worsening over the past few months. Will hold statin for the next month and she is encouraged to hydrate, rest, exercise, eat a low infection diet,  try topical treatments, check labs and notify us if pain does not improve.      Relevant Orders   Antinuclear Antib (ANA)   CBC (Completed)   Rheumatoid Factor (Completed)   Sedimentation rate (Completed)   Magnesium (Completed)      I am having Tricia Fleming maintain her fish oil-omega-3 fatty acids, Biotin, pantoprazole, nitroGLYCERIN, acetaminophen, sertraline, rosuvastatin, HYDROcodone-acetaminophen, zolpidem, and ALPRAZolam.  No orders of the defined types were placed in this encounter.    Penni Homans, MD

## 2020-11-22 LAB — ANA: Anti Nuclear Antibody (ANA): NEGATIVE

## 2020-11-22 LAB — RHEUMATOID FACTOR: Rheumatoid fact SerPl-aCnc: <14 IU/mL (ref <14)

## 2020-11-26 DIAGNOSIS — M65311 Trigger thumb, right thumb: Secondary | ICD-10-CM | POA: Insufficient documentation

## 2020-11-26 DIAGNOSIS — M65312 Trigger thumb, left thumb: Secondary | ICD-10-CM | POA: Insufficient documentation

## 2020-11-28 DIAGNOSIS — H35373 Puckering of macula, bilateral: Secondary | ICD-10-CM | POA: Diagnosis not present

## 2020-11-28 DIAGNOSIS — H524 Presbyopia: Secondary | ICD-10-CM | POA: Diagnosis not present

## 2020-11-28 DIAGNOSIS — H2511 Age-related nuclear cataract, right eye: Secondary | ICD-10-CM | POA: Diagnosis not present

## 2020-11-28 DIAGNOSIS — Z961 Presence of intraocular lens: Secondary | ICD-10-CM | POA: Diagnosis not present

## 2020-11-28 DIAGNOSIS — Z9889 Other specified postprocedural states: Secondary | ICD-10-CM | POA: Diagnosis not present

## 2020-11-28 DIAGNOSIS — H40013 Open angle with borderline findings, low risk, bilateral: Secondary | ICD-10-CM | POA: Diagnosis not present

## 2020-12-01 DIAGNOSIS — Z01812 Encounter for preprocedural laboratory examination: Secondary | ICD-10-CM | POA: Diagnosis not present

## 2020-12-01 DIAGNOSIS — Z20822 Contact with and (suspected) exposure to covid-19: Secondary | ICD-10-CM | POA: Diagnosis not present

## 2020-12-04 DIAGNOSIS — R002 Palpitations: Secondary | ICD-10-CM | POA: Diagnosis not present

## 2020-12-04 DIAGNOSIS — M65312 Trigger thumb, left thumb: Secondary | ICD-10-CM | POA: Diagnosis not present

## 2020-12-04 DIAGNOSIS — E669 Obesity, unspecified: Secondary | ICD-10-CM | POA: Diagnosis not present

## 2020-12-04 DIAGNOSIS — M65311 Trigger thumb, right thumb: Secondary | ICD-10-CM | POA: Diagnosis not present

## 2020-12-04 DIAGNOSIS — K219 Gastro-esophageal reflux disease without esophagitis: Secondary | ICD-10-CM | POA: Diagnosis not present

## 2020-12-04 DIAGNOSIS — E782 Mixed hyperlipidemia: Secondary | ICD-10-CM | POA: Diagnosis not present

## 2020-12-04 DIAGNOSIS — E785 Hyperlipidemia, unspecified: Secondary | ICD-10-CM | POA: Diagnosis not present

## 2020-12-04 DIAGNOSIS — G4700 Insomnia, unspecified: Secondary | ICD-10-CM | POA: Diagnosis not present

## 2020-12-04 DIAGNOSIS — M65841 Other synovitis and tenosynovitis, right hand: Secondary | ICD-10-CM | POA: Diagnosis not present

## 2020-12-04 DIAGNOSIS — R69 Illness, unspecified: Secondary | ICD-10-CM | POA: Diagnosis not present

## 2020-12-04 DIAGNOSIS — Z6830 Body mass index (BMI) 30.0-30.9, adult: Secondary | ICD-10-CM | POA: Diagnosis not present

## 2020-12-08 DIAGNOSIS — Z01812 Encounter for preprocedural laboratory examination: Secondary | ICD-10-CM | POA: Diagnosis not present

## 2020-12-08 DIAGNOSIS — Z20822 Contact with and (suspected) exposure to covid-19: Secondary | ICD-10-CM | POA: Diagnosis not present

## 2020-12-11 DIAGNOSIS — K219 Gastro-esophageal reflux disease without esophagitis: Secondary | ICD-10-CM | POA: Diagnosis not present

## 2020-12-11 DIAGNOSIS — G5603 Carpal tunnel syndrome, bilateral upper limbs: Secondary | ICD-10-CM | POA: Diagnosis not present

## 2020-12-11 DIAGNOSIS — Z791 Long term (current) use of non-steroidal anti-inflammatories (NSAID): Secondary | ICD-10-CM | POA: Diagnosis not present

## 2020-12-11 DIAGNOSIS — Z79899 Other long term (current) drug therapy: Secondary | ICD-10-CM | POA: Diagnosis not present

## 2020-12-11 DIAGNOSIS — M65311 Trigger thumb, right thumb: Secondary | ICD-10-CM | POA: Diagnosis not present

## 2020-12-11 DIAGNOSIS — R69 Illness, unspecified: Secondary | ICD-10-CM | POA: Diagnosis not present

## 2020-12-11 DIAGNOSIS — E782 Mixed hyperlipidemia: Secondary | ICD-10-CM | POA: Diagnosis not present

## 2020-12-11 DIAGNOSIS — M65312 Trigger thumb, left thumb: Secondary | ICD-10-CM | POA: Diagnosis not present

## 2020-12-18 DIAGNOSIS — R29898 Other symptoms and signs involving the musculoskeletal system: Secondary | ICD-10-CM | POA: Diagnosis not present

## 2020-12-18 DIAGNOSIS — M65311 Trigger thumb, right thumb: Secondary | ICD-10-CM | POA: Diagnosis not present

## 2020-12-18 DIAGNOSIS — R600 Localized edema: Secondary | ICD-10-CM | POA: Diagnosis not present

## 2020-12-18 DIAGNOSIS — Z9889 Other specified postprocedural states: Secondary | ICD-10-CM | POA: Diagnosis not present

## 2020-12-18 DIAGNOSIS — Z4789 Encounter for other orthopedic aftercare: Secondary | ICD-10-CM | POA: Diagnosis not present

## 2020-12-26 ENCOUNTER — Other Ambulatory Visit: Payer: Self-pay | Admitting: Family Medicine

## 2021-01-09 ENCOUNTER — Telehealth: Payer: Self-pay | Admitting: Family Medicine

## 2021-01-09 NOTE — Telephone Encounter (Signed)
Please advise. I did not see notes in encounters or labs.

## 2021-01-09 NOTE — Telephone Encounter (Signed)
Patient stated Dr. Charlett Blake wants it to speak with her.

## 2021-01-09 NOTE — Telephone Encounter (Signed)
I have not contacted her, we have any appt in about six weeks for follow up. If she needs something before that see what it might be and we will consider an earlier VV.

## 2021-01-11 NOTE — Telephone Encounter (Signed)
Pt states that she is feeling fatigue and has muscle and joint pain. She states that she a ready had labs and others things but they could not find anything.

## 2021-01-11 NOTE — Telephone Encounter (Signed)
She has had surgery since I saw her last so she could be recovering or have gotten a low grade infection since then. Set her up with a CBC with diff, cmp, crp and sed rate and UA with culture for fatigue, myalgias and abdominal pain please order and get her a lab appt for early next week and then set her up with a virtual visit on Wed or Friday morning next week unless there is a spot another day

## 2021-01-14 ENCOUNTER — Other Ambulatory Visit: Payer: Self-pay

## 2021-01-14 DIAGNOSIS — M791 Myalgia, unspecified site: Secondary | ICD-10-CM

## 2021-01-14 DIAGNOSIS — R5383 Other fatigue: Secondary | ICD-10-CM

## 2021-01-14 DIAGNOSIS — R109 Unspecified abdominal pain: Secondary | ICD-10-CM

## 2021-01-14 NOTE — Telephone Encounter (Signed)
Orders in scheduled for lab appointment tomorrow and vv for Friday.

## 2021-01-15 ENCOUNTER — Other Ambulatory Visit: Payer: Self-pay | Admitting: Family Medicine

## 2021-01-15 ENCOUNTER — Other Ambulatory Visit (INDEPENDENT_AMBULATORY_CARE_PROVIDER_SITE_OTHER): Payer: Medicare HMO

## 2021-01-15 ENCOUNTER — Other Ambulatory Visit: Payer: Medicare HMO

## 2021-01-15 ENCOUNTER — Other Ambulatory Visit: Payer: Self-pay

## 2021-01-15 DIAGNOSIS — M791 Myalgia, unspecified site: Secondary | ICD-10-CM

## 2021-01-15 DIAGNOSIS — E785 Hyperlipidemia, unspecified: Secondary | ICD-10-CM

## 2021-01-15 DIAGNOSIS — R109 Unspecified abdominal pain: Secondary | ICD-10-CM | POA: Diagnosis not present

## 2021-01-15 DIAGNOSIS — R69 Illness, unspecified: Secondary | ICD-10-CM | POA: Diagnosis not present

## 2021-01-15 DIAGNOSIS — Z79899 Other long term (current) drug therapy: Secondary | ICD-10-CM

## 2021-01-15 DIAGNOSIS — F418 Other specified anxiety disorders: Secondary | ICD-10-CM

## 2021-01-15 DIAGNOSIS — G4700 Insomnia, unspecified: Secondary | ICD-10-CM | POA: Diagnosis not present

## 2021-01-15 DIAGNOSIS — R5383 Other fatigue: Secondary | ICD-10-CM | POA: Diagnosis not present

## 2021-01-15 LAB — URINALYSIS
Bilirubin Urine: NEGATIVE
Hgb urine dipstick: NEGATIVE
Ketones, ur: NEGATIVE
Leukocytes,Ua: NEGATIVE
Nitrite: NEGATIVE
Specific Gravity, Urine: 1.02 (ref 1.000–1.030)
Total Protein, Urine: NEGATIVE
Urine Glucose: NEGATIVE
Urobilinogen, UA: 0.2 (ref 0.0–1.0)
pH: 6.5 (ref 5.0–8.0)

## 2021-01-15 LAB — CBC WITH DIFFERENTIAL/PLATELET
Basophils Absolute: 0 10*3/uL (ref 0.0–0.1)
Basophils Relative: 0.6 % (ref 0.0–3.0)
Eosinophils Absolute: 0.1 10*3/uL (ref 0.0–0.7)
Eosinophils Relative: 1.1 % (ref 0.0–5.0)
HCT: 40.4 % (ref 36.0–46.0)
Hemoglobin: 13.5 g/dL (ref 12.0–15.0)
Lymphocytes Relative: 62.4 % — ABNORMAL HIGH (ref 12.0–46.0)
Lymphs Abs: 3 10*3/uL (ref 0.7–4.0)
MCHC: 33.3 g/dL (ref 30.0–36.0)
MCV: 84.2 fl (ref 78.0–100.0)
Monocytes Absolute: 0.3 10*3/uL (ref 0.1–1.0)
Monocytes Relative: 6.7 % (ref 3.0–12.0)
Neutro Abs: 1.4 10*3/uL (ref 1.4–7.7)
Neutrophils Relative %: 29.2 % — ABNORMAL LOW (ref 43.0–77.0)
Platelets: 144 10*3/uL — ABNORMAL LOW (ref 150.0–400.0)
RBC: 4.8 Mil/uL (ref 3.87–5.11)
RDW: 14 % (ref 11.5–15.5)
WBC: 4.7 10*3/uL (ref 4.0–10.5)

## 2021-01-15 LAB — COMPREHENSIVE METABOLIC PANEL
ALT: 49 U/L — ABNORMAL HIGH (ref 0–35)
AST: 37 U/L (ref 0–37)
Albumin: 4.4 g/dL (ref 3.5–5.2)
Alkaline Phosphatase: 78 U/L (ref 39–117)
BUN: 14 mg/dL (ref 6–23)
CO2: 33 mEq/L — ABNORMAL HIGH (ref 19–32)
Calcium: 9.6 mg/dL (ref 8.4–10.5)
Chloride: 100 mEq/L (ref 96–112)
Creatinine, Ser: 0.66 mg/dL (ref 0.40–1.20)
GFR: 88.73 mL/min (ref >60.00)
Glucose, Bld: 120 mg/dL — ABNORMAL HIGH (ref 70–99)
Potassium: 4.7 mEq/L (ref 3.5–5.1)
Sodium: 139 mEq/L (ref 135–145)
Total Bilirubin: 0.4 mg/dL (ref 0.2–1.2)
Total Protein: 6.9 g/dL (ref 6.0–8.3)

## 2021-01-15 LAB — SEDIMENTATION RATE: Sed Rate: 6 mm/h (ref 0–30)

## 2021-01-15 LAB — C-REACTIVE PROTEIN: CRP: <1 mg/dL (ref 0.5–20.0)

## 2021-01-17 LAB — DRUG MONITORING, PANEL 8 WITH CONFIRMATION, URINE
6 Acetylmorphine: NEGATIVE ng/mL (ref <10)
Alcohol Metabolites: NEGATIVE ng/mL
Alphahydroxyalprazolam: 345 ng/mL — ABNORMAL HIGH (ref <25)
Alphahydroxymidazolam: NEGATIVE ng/mL (ref <50)
Alphahydroxytriazolam: NEGATIVE ng/mL (ref <50)
Aminoclonazepam: NEGATIVE ng/mL (ref <25)
Amphetamines: NEGATIVE ng/mL (ref <500)
Benzodiazepines: POSITIVE ng/mL — AB (ref <100)
Buprenorphine, Urine: NEGATIVE ng/mL (ref <5)
Cocaine Metabolite: NEGATIVE ng/mL (ref <150)
Creatinine: 107.9 mg/dL
Hydroxyethylflurazepam: NEGATIVE ng/mL (ref <50)
Lorazepam: NEGATIVE ng/mL (ref <50)
MDMA: NEGATIVE ng/mL (ref <500)
Marijuana Metabolite: NEGATIVE ng/mL (ref <20)
Nordiazepam: NEGATIVE ng/mL (ref <50)
Opiates: NEGATIVE ng/mL (ref <100)
Oxazepam: NEGATIVE ng/mL (ref <50)
Oxidant: NEGATIVE ug/mL
Oxycodone: NEGATIVE ng/mL (ref <100)
Temazepam: NEGATIVE ng/mL (ref <50)
pH: 6.7 (ref 4.5–9.0)

## 2021-01-17 LAB — URINE CULTURE
MICRO NUMBER:: 11676842
Result:: NO GROWTH
SPECIMEN QUALITY:: ADEQUATE

## 2021-01-17 LAB — DM TEMPLATE

## 2021-01-18 ENCOUNTER — Telehealth: Payer: Medicare HMO | Admitting: Family Medicine

## 2021-01-18 ENCOUNTER — Other Ambulatory Visit: Payer: Self-pay

## 2021-01-18 ENCOUNTER — Telehealth (INDEPENDENT_AMBULATORY_CARE_PROVIDER_SITE_OTHER): Payer: Medicare HMO | Admitting: Family Medicine

## 2021-01-18 DIAGNOSIS — R739 Hyperglycemia, unspecified: Secondary | ICD-10-CM

## 2021-01-18 DIAGNOSIS — E785 Hyperlipidemia, unspecified: Secondary | ICD-10-CM

## 2021-01-18 DIAGNOSIS — G4700 Insomnia, unspecified: Secondary | ICD-10-CM | POA: Diagnosis not present

## 2021-01-18 DIAGNOSIS — M791 Myalgia, unspecified site: Secondary | ICD-10-CM

## 2021-01-18 DIAGNOSIS — F418 Other specified anxiety disorders: Secondary | ICD-10-CM

## 2021-01-18 DIAGNOSIS — R251 Tremor, unspecified: Secondary | ICD-10-CM | POA: Diagnosis not present

## 2021-01-18 DIAGNOSIS — R5383 Other fatigue: Secondary | ICD-10-CM | POA: Diagnosis not present

## 2021-01-18 DIAGNOSIS — R69 Illness, unspecified: Secondary | ICD-10-CM | POA: Diagnosis not present

## 2021-01-18 NOTE — Addendum Note (Signed)
Addended by: Randolm Idol A on: 01/18/2021 02:05 PM   Modules accepted: Orders

## 2021-01-19 DIAGNOSIS — R251 Tremor, unspecified: Secondary | ICD-10-CM | POA: Insufficient documentation

## 2021-01-19 NOTE — Assessment & Plan Note (Signed)
She feels stable at this time to no change in meds.

## 2021-01-19 NOTE — Assessment & Plan Note (Signed)
Encouraged to get 6-8 hours of sleep avoid processed food and increase exercise.

## 2021-01-19 NOTE — Assessment & Plan Note (Signed)
Labs are unremarkable. Will continue to monitor labs and she is encouraged to drink 60-80 ounces of fluids daily, eat protein every 4-5 hours. Avoid processed foods, simple carbohydrates and increase activity

## 2021-01-19 NOTE — Assessment & Plan Note (Signed)
Encouraged good sleep hygiene such as dark, quiet room. No blue/green glowing lights such as computer screens in bedroom. No alcohol or stimulants in evening. Cut down on caffeine as able. Regular exercise is helpful but not just prior to bed time.  

## 2021-01-19 NOTE — Assessment & Plan Note (Signed)
Just in her hands, has been around for years but becoming more frequent, it does not limit her activities yet. She declines further work up at this time but will let us know if she changes her mind.

## 2021-01-19 NOTE — Assessment & Plan Note (Signed)
hgba1c acceptable, minimize simple carbs. Increase exercise as tolerated.  

## 2021-01-19 NOTE — Progress Notes (Signed)
MyChart Video Visit    Virtual Visit via Video Note   This visit type was conducted due to national recommendations for restrictions regarding the COVID-19 Pandemic (e.g. social distancing) in an effort to limit this patient's exposure and mitigate transmission in our community. This patient is at least at moderate risk for complications without adequate follow up. This format is felt to be most appropriate for this patient at this time. Physical exam was limited by quality of the video and audio technology used for the visit. [name], CMA was able to get the patient set up on a video visit.  Patient location: Home, Patient and provider in visit Provider location: Office  I discussed the limitations of evaluation and management by telemedicine and the availability of in person appointments. The patient expressed understanding and agreed to proceed.  Visit Date: 01/18/2021  Today's healthcare provider: Penni Homans, MD     Subjective:    Patient ID: Tricia Fleming, female    DOB: May 05, 1951, 70 y.o.   MRN: [id number]  Chief Complaint  Patient presents with  . Follow-up  . Hyperlipidemia    HPI Patient is in today for follow up on chronic medical concerns. No recent febrile illness or hospitalizations. She is struggling with fatigue and myalgias, she has been helping to babysit her 44 month old grandchild several days a week. She is noting a worsening in the fine tremor in her hands. She continues to struggle with insomnia. Denies CP/palp/SOB/HA/congestion/fevers/GI or GU c/o. Taking meds as prescribed  Past Medical History:  Diagnosis Date  . Anxiety   . Atypical chest pain 12/08/2016  . Breast lesion 06/18/2010   Qualifier: Diagnosis of  By: Charlett Blake MD, Erline Levine    . Depression   . Depression with anxiety 06/27/2011  . Hyperglycemia 07/27/2013  . Hyperlipidemia   . Osteopenia 01/15/2016  . Preventative health care 06/27/2011  . Preventative health care 06/27/2011  . SCC  (squamous cell carcinoma), leg 05/04/2014   SCC   . Shoulder pain, bilateral 02/13/2013    Past Surgical History:  Procedure Laterality Date  . BREAST BIOPSY Left    needle core biopsy, benign  . CHOLECYSTECTOMY    . TUBAL LIGATION      Family History  Problem Relation Age of Onset  . Cancer Father        metastic prostate  . Diabetes Mother   . Hypertension Mother   . Coronary artery disease Mother   . Transient ischemic attack Sister   . Heart disease Brother   . Benign prostatic hyperplasia Brother   . Lung disease Brother        in a smoker  . Insulin resistance Brother   . Hypertension Sister   . Benign prostatic hyperplasia Brother   . Alcohol abuse Brother   . Arthritis Sister   . Diabetes Maternal Grandmother   . Coronary artery disease Maternal Grandmother   . Diabetes Maternal Grandfather   . Esophageal varices Brother   . Cirrhosis Brother   . Cancer Maternal Aunt   . Birth defects Paternal Aunt        breast    Social History   Socioeconomic History  . Marital status: Married    Spouse name: Not on file  . Number of children: Not on file  . Years of education: Not on file  . Highest education level: Not on file  Occupational History  . Not on file  Tobacco Use  . Smoking status:  Never Smoker  . Smokeless tobacco: Never Used  Substance and Sexual Activity  . Alcohol use: Yes    Alcohol/week: 0.0 standard drinks    Comment: a couple beers a week  . Drug use: No  . Sexual activity: Yes    Partners: Male  Other Topics Concern  . Not on file  Social History Narrative  . Not on file   Social Determinants of Health   Financial Resource Strain: Not on file  Food Insecurity: Not on file  Transportation Needs: Not on file  Physical Activity: Not on file  Stress: Not on file  Social Connections: Not on file  Intimate Partner Violence: Not on file    Outpatient Medications Prior to Visit  Medication Sig Dispense Refill  . acetaminophen  (TYLENOL) 500 MG tablet Take 500 mg by mouth 2 (two) times daily as needed.    . ALPRAZolam (XANAX) 1 MG tablet TAKE 1 TABLET (1 MG TOTAL) BY MOUTH 2 (TWO) TIMES DAILY AS NEEDED FOR ANXIETY. 180 tablet 2  . Biotin 5000 MCG CAPS Take by mouth.    . fish oil-omega-3 fatty acids 1000 MG capsule Take 1,000 mg by mouth daily. 2 caps    . HYDROcodone-acetaminophen (NORCO/VICODIN) 5-325 MG tablet Take by mouth.    . nitroGLYCERIN (NITROSTAT) 0.4 MG SL tablet Place 1 tablet (0.4 mg total) under the tongue every 5 (five) minutes as needed for chest pain. 25 tablet 1  . pantoprazole (PROTONIX) 40 MG tablet TAKE 1 TABLET BY MOUTH DAILY AS NEEDED 90 tablet 1  . rosuvastatin (CRESTOR) 40 MG tablet Take 1 tablet (40 mg total) by mouth at bedtime. 90 tablet 1  . sertraline (ZOLOFT) 100 MG tablet Take 1 tablet (100 mg total) by mouth daily. 90 tablet 1  . zolpidem (AMBIEN) 10 MG tablet TAKE 1 TABLET (10 MG TOTAL) BY MOUTH AT BEDTIME AS NEEDED. FOR SLEEP 90 tablet 2   No facility-administered medications prior to visit.    Allergies  Allergen Reactions  . Meloxicam Other (See Comments)    "HEADACHE, POOR APPETITE, AND ANXIOUS"    Review of Systems  Constitutional: Positive for malaise/fatigue. Negative for fever.  HENT: Negative for congestion.   Eyes: Negative for blurred vision.  Respiratory: Negative for shortness of breath.   Cardiovascular: Negative for chest pain, palpitations and leg swelling.  Gastrointestinal: Negative for abdominal pain, blood in stool and nausea.  Genitourinary: Negative for dysuria and frequency.  Musculoskeletal: Positive for myalgias. Negative for falls.  Skin: Negative for rash.  Neurological: Negative for dizziness, loss of consciousness and headaches.  Endo/Heme/Allergies: Negative for environmental allergies.  Psychiatric/Behavioral: Negative for depression. The patient is nervous/anxious.        Objective:    Physical Exam Constitutional:      Appearance:  Normal appearance. She is not ill-appearing.  HENT:     Head: Normocephalic and atraumatic.     Right Ear: External ear normal.     Left Ear: External ear normal.  Eyes:     General:        Right eye: No discharge.        Left eye: No discharge.  Pulmonary:     Effort: Pulmonary effort is normal.  Neurological:     Mental Status: She is alert and oriented to person, place, and time.  Psychiatric:        Behavior: Behavior normal.     There were no vitals taken for this visit. Wt Readings from Last 3  Encounters:  11/20/20 167 lb 3.2 oz (75.8 kg)  09/10/20 163 lb 1.6 oz (74 kg)  07/24/20 161 lb (73 kg)    Diabetic Foot Exam - Simple   No data filed    Lab Results  Component Value Date   WBC 4.7 01/15/2021   HGB 13.5 01/15/2021   HCT 40.4 01/15/2021   PLT 144.0 (L) 01/15/2021   GLUCOSE 120 (H) 01/15/2021   CHOL 137 11/20/2020   TRIG 225.0 (H) 11/20/2020   HDL 45.70 11/20/2020   LDLDIRECT 68.0 11/20/2020   LDLCALC 138 (H) 07/24/2020   ALT 49 (H) 01/15/2021   AST 37 01/15/2021   NA 139 01/15/2021   K 4.7 01/15/2021   CL 100 01/15/2021   CREATININE 0.66 01/15/2021   BUN 14 01/15/2021   CO2 33 (H) 01/15/2021   TSH 2.43 11/20/2020   HGBA1C 6.3 11/20/2020   MICROALBUR <0.7 07/06/2015    Lab Results  Component Value Date   TSH 2.43 11/20/2020   Lab Results  Component Value Date   WBC 4.7 01/15/2021   HGB 13.5 01/15/2021   HCT 40.4 01/15/2021   MCV 84.2 01/15/2021   PLT 144.0 (L) 01/15/2021   Lab Results  Component Value Date   NA 139 01/15/2021   K 4.7 01/15/2021   CO2 33 (H) 01/15/2021   GLUCOSE 120 (H) 01/15/2021   BUN 14 01/15/2021   CREATININE 0.66 01/15/2021   BILITOT 0.4 01/15/2021   ALKPHOS 78 01/15/2021   AST 37 01/15/2021   ALT 49 (H) 01/15/2021   PROT 6.9 01/15/2021   ALBUMIN 4.4 01/15/2021   CALCIUM 9.6 01/15/2021   GFR 88.73 01/15/2021   Lab Results  Component Value Date   CHOL 137 11/20/2020   Lab Results  Component Value Date    HDL 45.70 11/20/2020   Lab Results  Component Value Date   LDLCALC 138 (H) 07/24/2020   Lab Results  Component Value Date   TRIG 225.0 (H) 11/20/2020   Lab Results  Component Value Date   CHOLHDL 3 11/20/2020   Lab Results  Component Value Date   HGBA1C 6.3 11/20/2020       Assessment & Plan:   Problem List Items Addressed This Visit    Insomnia    Encouraged good sleep hygiene such as dark, quiet room. No blue/green glowing lights such as computer screens in bedroom. No alcohol or stimulants in evening. Cut down on caffeine as able. Regular exercise is helpful but not just prior to bed time.       Depression with anxiety    She feels stable at this time to no change in meds.       Hyperglycemia    hgba1c acceptable, minimize simple carbs. Increase exercise as tolerated.       Fatigue    Encouraged to get 6-8 hours of sleep avoid processed food and increase exercise.       Hyperlipidemia - Primary    Encouraged heart healthy diet, increase exercise, avoid trans fats, consider a krill oil cap daily      Myalgia    Labs are unremarkable. Will continue to monitor labs and she is encouraged to drink 60-80 ounces of fluids daily, eat protein every 4-5 hours. Avoid processed foods, simple carbohydrates and increase activity         I am having Jaylah H. Rowe Robert maintain her fish oil-omega-3 fatty acids, Biotin, pantoprazole, nitroGLYCERIN, acetaminophen, rosuvastatin, HYDROcodone-acetaminophen, zolpidem, ALPRAZolam, and sertraline.  No orders of the  defined types were placed in this encounter.   I discussed the assessment and treatment plan with the patient. The patient was provided an opportunity to ask questions and all were answered. The patient agreed with the plan and demonstrated an understanding of the instructions.   The patient was advised to call back or seek an in-person evaluation if the symptoms worsen or if the condition fails to improve as  anticipated.  I provided 20 minutes of face-to-face time during this encounter.   Penni Homans, MD [facility] at [facility] [phone] (phone) [phone] (fax)  [place]

## 2021-01-19 NOTE — Assessment & Plan Note (Signed)
>>  ASSESSMENT AND PLAN FOR MIXED DIABETIC HYPERLIPIDEMIA ASSOCIATED WITH TYPE 2 DIABETES MELLITUS (HCC) WRITTEN ON 01/19/2021  4:02 PM BY BLYTH, STACEY A, MD  hgba1c acceptable, minimize simple carbs. Increase exercise as tolerated.

## 2021-01-19 NOTE — Assessment & Plan Note (Signed)
Encouraged heart healthy diet, increase exercise, avoid trans fats, consider a krill oil cap daily 

## 2021-01-29 ENCOUNTER — Other Ambulatory Visit: Payer: Self-pay | Admitting: Family Medicine

## 2021-02-04 DIAGNOSIS — L821 Other seborrheic keratosis: Secondary | ICD-10-CM | POA: Diagnosis not present

## 2021-02-04 DIAGNOSIS — D225 Melanocytic nevi of trunk: Secondary | ICD-10-CM | POA: Diagnosis not present

## 2021-02-04 DIAGNOSIS — L570 Actinic keratosis: Secondary | ICD-10-CM | POA: Diagnosis not present

## 2021-02-04 DIAGNOSIS — L814 Other melanin hyperpigmentation: Secondary | ICD-10-CM | POA: Diagnosis not present

## 2021-02-04 DIAGNOSIS — L905 Scar conditions and fibrosis of skin: Secondary | ICD-10-CM | POA: Diagnosis not present

## 2021-02-04 DIAGNOSIS — Z85828 Personal history of other malignant neoplasm of skin: Secondary | ICD-10-CM | POA: Diagnosis not present

## 2021-02-11 ENCOUNTER — Other Ambulatory Visit: Payer: Medicare HMO

## 2021-02-20 ENCOUNTER — Other Ambulatory Visit: Payer: Self-pay

## 2021-02-20 ENCOUNTER — Other Ambulatory Visit (INDEPENDENT_AMBULATORY_CARE_PROVIDER_SITE_OTHER): Payer: Medicare HMO

## 2021-02-20 DIAGNOSIS — M791 Myalgia, unspecified site: Secondary | ICD-10-CM | POA: Diagnosis not present

## 2021-02-20 DIAGNOSIS — E785 Hyperlipidemia, unspecified: Secondary | ICD-10-CM | POA: Diagnosis not present

## 2021-02-20 LAB — CBC WITH DIFFERENTIAL/PLATELET
Basophils Absolute: 0 10*3/uL (ref 0.0–0.1)
Basophils Relative: 0.9 % (ref 0.0–3.0)
Eosinophils Absolute: 0.1 10*3/uL (ref 0.0–0.7)
Eosinophils Relative: 2.6 % (ref 0.0–5.0)
HCT: 41.8 % (ref 36.0–46.0)
Hemoglobin: 13.9 g/dL (ref 12.0–15.0)
Lymphocytes Relative: 51.7 % — ABNORMAL HIGH (ref 12.0–46.0)
Lymphs Abs: 1.8 10*3/uL (ref 0.7–4.0)
MCHC: 33.4 g/dL (ref 30.0–36.0)
MCV: 84.7 fl (ref 78.0–100.0)
Monocytes Absolute: 0.3 10*3/uL (ref 0.1–1.0)
Monocytes Relative: 7.2 % (ref 3.0–12.0)
Neutro Abs: 1.3 10*3/uL — ABNORMAL LOW (ref 1.4–7.7)
Neutrophils Relative %: 37.6 % — ABNORMAL LOW (ref 43.0–77.0)
Platelets: 170 10*3/uL (ref 150.0–400.0)
RBC: 4.93 Mil/uL (ref 3.87–5.11)
RDW: 14.3 % (ref 11.5–15.5)
WBC: 3.5 10*3/uL — ABNORMAL LOW (ref 4.0–10.5)

## 2021-02-20 LAB — LIPID PANEL
Cholesterol: 142 mg/dL (ref 0–200)
HDL: 40.8 mg/dL (ref >39.00)
NonHDL: 101.48
Total CHOL/HDL Ratio: 3
Triglycerides: 272 mg/dL — ABNORMAL HIGH (ref 0.0–149.0)
VLDL: 54.4 mg/dL — ABNORMAL HIGH (ref 0.0–40.0)

## 2021-02-20 LAB — COMPREHENSIVE METABOLIC PANEL
ALT: 39 U/L — ABNORMAL HIGH (ref 0–35)
AST: 32 U/L (ref 0–37)
Albumin: 4.6 g/dL (ref 3.5–5.2)
Alkaline Phosphatase: 73 U/L (ref 39–117)
BUN: 16 mg/dL (ref 6–23)
CO2: 29 mEq/L (ref 19–32)
Calcium: 10 mg/dL (ref 8.4–10.5)
Chloride: 102 mEq/L (ref 96–112)
Creatinine, Ser: 0.71 mg/dL (ref 0.40–1.20)
GFR: 85.95 mL/min (ref >60.00)
Glucose, Bld: 119 mg/dL — ABNORMAL HIGH (ref 70–99)
Potassium: 4.9 mEq/L (ref 3.5–5.1)
Sodium: 140 mEq/L (ref 135–145)
Total Bilirubin: 0.4 mg/dL (ref 0.2–1.2)
Total Protein: 7.5 g/dL (ref 6.0–8.3)

## 2021-02-20 LAB — TSH: TSH: 2.35 u[IU]/mL (ref 0.35–4.50)

## 2021-02-20 LAB — HEMOGLOBIN A1C: Hgb A1c MFr Bld: 6.2 % (ref 4.6–6.5)

## 2021-02-20 LAB — LDL CHOLESTEROL, DIRECT: Direct LDL: 76 mg/dL

## 2021-02-26 ENCOUNTER — Other Ambulatory Visit: Payer: Self-pay

## 2021-02-26 ENCOUNTER — Encounter: Payer: Self-pay | Admitting: Family Medicine

## 2021-02-26 ENCOUNTER — Ambulatory Visit (INDEPENDENT_AMBULATORY_CARE_PROVIDER_SITE_OTHER): Payer: Medicare HMO | Admitting: Family Medicine

## 2021-02-26 DIAGNOSIS — M25562 Pain in left knee: Secondary | ICD-10-CM

## 2021-02-26 DIAGNOSIS — E538 Deficiency of other specified B group vitamins: Secondary | ICD-10-CM | POA: Diagnosis not present

## 2021-02-26 DIAGNOSIS — F418 Other specified anxiety disorders: Secondary | ICD-10-CM

## 2021-02-26 DIAGNOSIS — R7989 Other specified abnormal findings of blood chemistry: Secondary | ICD-10-CM

## 2021-02-26 DIAGNOSIS — R739 Hyperglycemia, unspecified: Secondary | ICD-10-CM

## 2021-02-26 DIAGNOSIS — M25512 Pain in left shoulder: Secondary | ICD-10-CM | POA: Diagnosis not present

## 2021-02-26 DIAGNOSIS — G8929 Other chronic pain: Secondary | ICD-10-CM | POA: Diagnosis not present

## 2021-02-26 DIAGNOSIS — E785 Hyperlipidemia, unspecified: Secondary | ICD-10-CM | POA: Diagnosis not present

## 2021-02-26 DIAGNOSIS — R5383 Other fatigue: Secondary | ICD-10-CM | POA: Diagnosis not present

## 2021-02-26 DIAGNOSIS — R69 Illness, unspecified: Secondary | ICD-10-CM | POA: Diagnosis not present

## 2021-02-26 NOTE — Patient Instructions (Addendum)
MIND diet or low DASH diet   Tylenol twice a day  Chiropractic   Osteoarthritis  Osteoarthritis is a type of arthritis. It refers to joint pain or joint disease. Osteoarthritis affects tissue that covers the ends of bones in joints (cartilage). Cartilage acts as a cushion between the bones and helps them move smoothly. Osteoarthritis occurs when cartilage in the joints gets worn down. Osteoarthritis is sometimes called "wear and tear" arthritis. Osteoarthritis is the most common form of arthritis. It often occurs in older people. It is a condition that gets worse over time. The joints most often affected by this condition are in the fingers, toes, hips, knees, and spine, including the neck and lower back. What are the causes? This condition is caused by the wearing down of cartilage that covers the ends of bones. What increases the risk? The following factors may make you more likely to develop this condition:  Being age 71 or older.  Obesity.  Overuse of joints.  Past injury of a joint.  Past surgery on a joint.  Family history of osteoarthritis. What are the signs or symptoms? The main symptoms of this condition are pain, swelling, and stiffness in the joint. Other symptoms may include:  An enlarged joint.  More pain and further damage caused by small pieces of bone or cartilage that break off and float inside of the joint.  Small deposits of bone (osteophytes) that grow on the edges of the joint.  A grating or scraping feeling inside the joint when you move it.  Popping or creaking sounds when you move.  Difficulty walking or exercising.  An inability to grip items, twist your hand(s), or control the movements of your hands and fingers. How is this diagnosed? This condition may be diagnosed based on:  Your medical history.  A physical exam.  Your symptoms.  X-rays of the affected joint(s).  Blood tests to rule out other types of arthritis. How is this  treated? There is no cure for this condition, but treatment can help control pain and improve joint function. Treatment may include a combination of therapies, such as:  Pain relief techniques, such as: ? Applying heat and cold to the joint. ? Massage. ? A form of talk therapy called cognitive behavioral therapy (CBT). This therapy helps you set goals and follow up on the changes that you make.  Medicines for pain and inflammation. The medicines can be taken by mouth or applied to the skin. They include: ? NSAIDs, such as ibuprofen. ? Prescription medicines. ? Strong anti-inflammatory medicines (corticosteroids). ? Certain nutritional supplements.  A prescribed exercise program. You may work with a physical therapist.  Assistive devices, such as a brace, wrap, splint, specialized glove, or cane.  A weight control plan.  Surgery, such as: ? An osteotomy. This is done to reposition the bones and relieve pain or to remove loose pieces of bone and cartilage. ? Joint replacement surgery. You may need this surgery if you have advanced osteoarthritis. Follow these instructions at home: Activity  Rest your affected joints as told by your health care provider.  Exercise as told by your health care provider. He or she may recommend specific types of exercise, such as: ? Strengthening exercises. These are done to strengthen the muscles that support joints affected by arthritis. ? Aerobic activities. These are exercises, such as brisk walking or water aerobics, that increase your heart rate. ? Range-of-motion activities. These help your joints move more easily. ? Balance and agility exercises.  Managing pain, stiffness, and swelling  If directed, apply heat to the affected area as often as told by your health care provider. Use the heat source that your health care provider recommends, such as a moist heat pack or a heating pad. ? If you have a removable assistive device, remove it as told by  your health care provider. ? Place a towel between your skin and the heat source. If your health care provider tells you to keep the assistive device on while you apply heat, place a towel between the assistive device and the heat source. ? Leave the heat on for 20-30 minutes. ? Remove the heat if your skin turns bright red. This is especially important if you are unable to feel pain, heat, or cold. You may have a greater risk of getting burned.  If directed, put ice on the affected area. To do this: ? If you have a removable assistive device, remove it as told by your health care provider. ? Put ice in a plastic bag. ? Place a towel between your skin and the bag. If your health care provider tells you to keep the assistive device on during icing, place a towel between the assistive device and the bag. ? Leave the ice on for 20 minutes, 2-3 times a day. ? Move your fingers or toes often to reduce stiffness and swelling. ? Raise (elevate) the injured area above the level of your heart while you are sitting or lying down.      General instructions  Take over-the-counter and prescription medicines only as told by your health care provider.  Maintain a healthy weight. Follow instructions from your health care provider for weight control.  Do not use any products that contain nicotine or tobacco, such as cigarettes, e-cigarettes, and chewing tobacco. If you need help quitting, ask your health care provider.  Use assistive devices as told by your health care provider.  Keep all follow-up visits as told by your health care provider. This is important. Where to find more information  [employer] of Arthritis and Musculoskeletal and Skin Diseases: www.niams.SouthExposed.es  [employer] on Aging: http://kim-miller.com/  American College of Rheumatology: www.rheumatology.org Contact a health care provider if:  You have redness, swelling, or a feeling of warmth in a joint that gets worse.  You  have a fever along with joint or muscle aches.  You develop a rash.  You have trouble doing your normal activities. Get help right away if:  You have pain that gets worse and is not relieved by pain medicine. Summary  Osteoarthritis is a type of arthritis that affects tissue covering the ends of bones in joints (cartilage).  This condition is caused by the wearing down of cartilage that covers the ends of bones.  The main symptom of this condition is pain, swelling, and stiffness in the joint.  There is no cure for this condition, but treatment can help control pain and improve joint function. This information is not intended to replace advice given to you by your health care provider. Make sure you discuss any questions you have with your health care provider. Document Revised: 10/10/2019 Document Reviewed: 10/10/2019 Elsevier Patient Education  2021 [employer].

## 2021-02-26 NOTE — Assessment & Plan Note (Signed)
Encouraged increased exercise, increased sleep, increased hydration, heart healthy diet such as MIND diet with minimal carbohydrate intake. Continue to monitor labs

## 2021-02-26 NOTE — Assessment & Plan Note (Signed)
Supplement and monitor 

## 2021-02-26 NOTE — Assessment & Plan Note (Signed)
She is tolerating Sertraline 100 mg daily she is still struggling with low mood. She is wondering if the Sertraline is making her feel flat. She is concerned about her fatigue and wonders if it is related to her depression. Discussed the idea of counseling and she declines for now, she will consider changes in meds if no improvement discussed the need for increased physical activity, a heart healthy diet with minimal carbohydrates and plenty of water and rest. Report if no improvement.

## 2021-02-26 NOTE — Assessment & Plan Note (Signed)
>>  ASSESSMENT AND PLAN FOR MIXED DIABETIC HYPERLIPIDEMIA ASSOCIATED WITH TYPE 2 DIABETES MELLITUS (HCC) WRITTEN ON 02/26/2021  8:30 PM BY BLYTH, STACEY A, MD  hgba1c acceptable, minimize simple carbs. Increase exercise as tolerated.

## 2021-02-26 NOTE — Assessment & Plan Note (Signed)
Encouraged heart healthy diet, increase exercise, avoid trans fats, consider a krill oil cap daily 

## 2021-02-26 NOTE — Assessment & Plan Note (Signed)
Encouraged moist heat or cold and gentle stretching as tolerated. May try NSAIDs and prescription meds as directed and report if symptoms worsen or seek immediate care. Consider referral if worsens

## 2021-02-26 NOTE — Assessment & Plan Note (Signed)
hgba1c acceptable, minimize simple carbs. Increase exercise as tolerated.  

## 2021-02-26 NOTE — Assessment & Plan Note (Addendum)
She is offered a referral to ortho but declines for now the discomfort is manageable. She will consider massage therapy and chiropractic. Encouraged moist heat and gentle stretching as tolerated. May try NSAIDs and prescription meds as directed and report if symptoms worsen or seek immediate care

## 2021-02-26 NOTE — Progress Notes (Signed)
Patient ID: Tricia Fleming, female    DOB: 1951-03-22  Age: 70 y.o. MRN: [id number]    Subjective:  Subjective  HPI [name] presents for office visit today. She states that she has no recent sicknesses or recent ER visits. Although she reports that she still have trouble with joint pain and aches. She endorses taking tylenol, but she states that she is not consistent with taking it. She denies any chest pain, SOB, fever, abdominal pain, cough, chills, sore throat, dysuria, urinary incontinence, back pain, HA, or N/VD. She reports feeling fatigue due to lack of interest, lack of joy in doing things, and decreased mood. She reports feeling muscle pain and stiffness in her left shoulder.   Review of Systems  Constitutional: Positive for fatigue (due to decreased mood and lack of ijoy and interests). Negative for chills and fever.  HENT: Negative for congestion, rhinorrhea, sinus pressure, sinus pain and sore throat.   Eyes: Negative for pain.  Respiratory: Negative for cough and shortness of breath.   Cardiovascular: Negative for chest pain, palpitations and leg swelling.  Gastrointestinal: Negative for abdominal pain, blood in stool, diarrhea, nausea and vomiting.  Genitourinary: Negative for decreased urine volume, flank pain, frequency, vaginal bleeding and vaginal discharge.  Musculoskeletal: Positive for arthralgias. Negative for back pain.  Neurological: Negative for headaches.  Psychiatric/Behavioral: Positive for decreased concentration and dysphoric mood.    History Past Medical History:  Diagnosis Date  . Anxiety   . Atypical chest pain 12/08/2016  . Breast lesion 06/18/2010   Qualifier: Diagnosis of  By: Charlett Blake MD, Erline Levine    . Depression   . Depression with anxiety 06/27/2011  . Hyperglycemia 07/27/2013  . Hyperlipidemia   . Osteopenia 01/15/2016  . Preventative health care 06/27/2011  . Preventative health care 06/27/2011  . SCC (squamous cell carcinoma), leg 05/04/2014    SCC   . Shoulder pain, bilateral 02/13/2013    She has a past surgical history that includes Tubal ligation; Breast biopsy (Left); and Cholecystectomy.   Her family history includes Alcohol abuse in her brother; Arthritis in her sister; Benign prostatic hyperplasia in her brother and brother; Birth defects in her paternal aunt; Cancer in her father and maternal aunt; Cirrhosis in her brother; Coronary artery disease in her maternal grandmother and mother; Diabetes in her maternal grandfather, maternal grandmother, and mother; Esophageal varices in her brother; Heart disease in her brother; Hypertension in her mother and sister; Insulin resistance in her brother; Lung disease in her brother; Transient ischemic attack in her sister.She reports that she has never smoked. She has never used smokeless tobacco. She reports current alcohol use. She reports that she does not use drugs.  Current Outpatient Medications on File Prior to Visit  Medication Sig Dispense Refill  . acetaminophen (TYLENOL) 500 MG tablet Take 500 mg by mouth 2 (two) times daily as needed.    . ALPRAZolam (XANAX) 1 MG tablet TAKE 1 TABLET (1 MG TOTAL) BY MOUTH 2 (TWO) TIMES DAILY AS NEEDED FOR ANXIETY. 180 tablet 2  . Biotin 5000 MCG CAPS Take by mouth.    . fish oil-omega-3 fatty acids 1000 MG capsule Take 1,000 mg by mouth daily. 2 caps    . HYDROcodone-acetaminophen (NORCO/VICODIN) 5-325 MG tablet Take by mouth.    . nitroGLYCERIN (NITROSTAT) 0.4 MG SL tablet Place 1 tablet (0.4 mg total) under the tongue every 5 (five) minutes as needed for chest pain. 25 tablet 1  . pantoprazole (PROTONIX) 40 MG tablet TAKE  1 TABLET BY MOUTH DAILY AS NEEDED 90 tablet 1  . rosuvastatin (CRESTOR) 40 MG tablet Take 1 tablet (40 mg total) by mouth at bedtime. 90 tablet 1  . sertraline (ZOLOFT) 100 MG tablet Take 1 tablet (100 mg total) by mouth daily. 90 tablet 1  . zolpidem (AMBIEN) 10 MG tablet TAKE 1 TABLET (10 MG TOTAL) BY MOUTH AT BEDTIME AS  NEEDED. FOR SLEEP 90 tablet 2   No current facility-administered medications on file prior to visit.     Objective:  Objective  Physical Exam Constitutional:      General: She is not in acute distress.    Appearance: Normal appearance. She is not ill-appearing or toxic-appearing.  HENT:     Head: Normocephalic and atraumatic.     Right Ear: Tympanic membrane, ear canal and external ear normal.     Left Ear: Tympanic membrane, ear canal and external ear normal.     Nose: No congestion or rhinorrhea.  Eyes:     Extraocular Movements: Extraocular movements intact.     Pupils: Pupils are equal, round, and reactive to light.  Cardiovascular:     Rate and Rhythm: Normal rate and regular rhythm.     Pulses: Normal pulses.     Heart sounds: Normal heart sounds. No murmur heard.   Pulmonary:     Effort: Pulmonary effort is normal. No respiratory distress.     Breath sounds: Normal breath sounds. No wheezing, rhonchi or rales.  Abdominal:     General: Bowel sounds are normal.     Palpations: Abdomen is soft. There is no mass.     Tenderness: There is no abdominal tenderness. There is no guarding.     Hernia: No hernia is present.  Musculoskeletal:        General: Normal range of motion.     Cervical back: Normal range of motion and neck supple.  Skin:    General: Skin is warm and dry.  Neurological:     Mental Status: She is alert and oriented to person, place, and time.  Psychiatric:        Behavior: Behavior normal.    BP 116/78   Pulse (!) 52   Temp 98.5 F (36.9 C)   Resp 16   Wt 156 lb 9.6 oz (71 kg)   SpO2 93%   BMI 28.64 kg/m  Wt Readings from Last 3 Encounters:  02/26/21 156 lb 9.6 oz (71 kg)  11/20/20 167 lb 3.2 oz (75.8 kg)  09/10/20 163 lb 1.6 oz (74 kg)     Lab Results  Component Value Date   WBC 3.5 (L) 02/20/2021   HGB 13.9 02/20/2021   HCT 41.8 02/20/2021   PLT 170.0 02/20/2021   GLUCOSE 119 (H) 02/20/2021   CHOL 142 02/20/2021   TRIG 272.0 (H)  02/20/2021   HDL 40.80 02/20/2021   LDLDIRECT 76.0 02/20/2021   LDLCALC 138 (H) 07/24/2020   ALT 39 (H) 02/20/2021   AST 32 02/20/2021   NA 140 02/20/2021   K 4.9 02/20/2021   CL 102 02/20/2021   CREATININE 0.71 02/20/2021   BUN 16 02/20/2021   CO2 29 02/20/2021   TSH 2.35 02/20/2021   HGBA1C 6.2 02/20/2021   MICROALBUR <0.7 07/06/2015    ECHOCARDIOGRAM COMPLETE  Result Date: 08/17/2020    ECHOCARDIOGRAM REPORT   Patient Name:   CERISSA ZEIGER Date of Exam: 08/17/2020 Medical Rec #:  [id number]       Height:  62.0 in Accession #:    [id number]      Weight:       161.0 lb Date of Birth:  03-13-1951       BSA:          1.743 m Patient Age:    37 years        BP:           133/83 mmHg Patient Gender: F               HR:           105 bpm. Exam Location:  [facility] Procedure: 2D Echo, Cardiac Doppler and Color Doppler Indications:    R00.2 Palpitations  History:        Patient has no prior history of Echocardiogram examinations.                 Arrythmias:Palpitations.  Sonographer:    Geradine Girt Referring Phys: [place]  1. Left ventricular ejection fraction, by estimation, is 60 to 65%. The left ventricle has normal function. The left ventricle has no regional wall motion abnormalities. Left ventricular diastolic parameters are consistent with Grade I diastolic dysfunction (impaired relaxation).  2. Right ventricular systolic function is normal. The right ventricular size is normal.  3. The mitral valve is degenerative. Mild mitral valve regurgitation. No evidence of mitral stenosis.  4. The aortic valve is tricuspid. Aortic valve regurgitation is not visualized. Mild aortic valve sclerosis is present, with no evidence of aortic valve stenosis.  5. The inferior vena cava is normal in size with greater than 50% respiratory variability, suggesting right atrial pressure of 3 mmHg. FINDINGS  Left Ventricle: Left ventricular ejection fraction, by estimation, is 60 to 65%.  The left ventricle has normal function. The left ventricle has no regional wall motion abnormalities. The left ventricular internal cavity size was normal in size. There is  no left ventricular hypertrophy. Left ventricular diastolic parameters are consistent with Grade I diastolic dysfunction (impaired relaxation). Indeterminate filling pressures. Right Ventricle: The right ventricular size is normal. No increase in right ventricular wall thickness. Right ventricular systolic function is normal. Left Atrium: Left atrial size was normal in size. Right Atrium: Right atrial size was normal in size. Pericardium: Trivial pericardial effusion is present. Mitral Valve: The mitral valve is degenerative in appearance. Mild mitral annular calcification. Mild mitral valve regurgitation. No evidence of mitral valve stenosis. Tricuspid Valve: The tricuspid valve is normal in structure. Tricuspid valve regurgitation is not demonstrated. No evidence of tricuspid stenosis. Aortic Valve: The aortic valve is tricuspid. Aortic valve regurgitation is not visualized. Mild aortic valve sclerosis is present, with no evidence of aortic valve stenosis. Pulmonic Valve: The pulmonic valve was normal in structure. Pulmonic valve regurgitation is not visualized. No evidence of pulmonic stenosis. Aorta: The aortic root and ascending aorta are structurally normal, with no evidence of dilitation. Venous: The pulmonary veins were not well visualized. The inferior vena cava is normal in size with greater than 50% respiratory variability, suggesting right atrial pressure of 3 mmHg. IAS/Shunts: No atrial level shunt detected by color flow Doppler.  LEFT VENTRICLE PLAX 2D LVIDd:         4.45 cm  Diastology LVIDs:         3.02 cm  LV e' medial:    5.66 cm/s LV PW:         0.89 cm  LV E/e' medial:  11.4 LV IVS:  1.25 cm  LV e' lateral:   6.09 cm/s LVOT diam:     1.80 cm  LV E/e' lateral: 10.6 LV SV:         49 LV SV Index:   28 LVOT Area:     2.54  cm                          3D Volume EF:                         3D EF:        76 %                         LV EDV:       457 ml                         LV ESV:       110 ml                         LV SV:        347 ml RIGHT VENTRICLE RV S prime:     17.70 cm/s TAPSE (M-mode): 2.2 cm LEFT ATRIUM             Index       RIGHT ATRIUM          Index LA diam:        3.70 cm 2.12 cm/m  RA Area:     7.71 cm LA Vol (A2C):   29.5 ml 16.92 ml/m RA Volume:   13.80 ml 7.92 ml/m LA Vol (A4C):   13.5 ml 7.74 ml/m LA Biplane Vol: 21.1 ml 12.10 ml/m  AORTIC VALVE LVOT Vmax:   104.00 cm/s LVOT Vmean:  60.600 cm/s LVOT VTI:    0.192 m  AORTA Ao Root diam: 2.70 cm Ao Asc diam:  2.90 cm MITRAL VALVE MV Area (PHT): 3.63 cm    SHUNTS MV Decel Time: 209 msec    Systemic VTI:  0.19 m MV E velocity: 64.70 cm/s  Systemic Diam: 1.80 cm MV A velocity: 95.10 cm/s MV E/A ratio:  0.68 Norman Herrlich MD Electronically signed by Norman Herrlich MD Signature Date/Time: 08/17/2020/5:41:51 PM    Final      Assessment & Plan:  Plan    No orders of the defined types were placed in this encounter.   Problem List Items Addressed This Visit    Depression with anxiety    She is tolerating Sertraline 100 mg daily she is still struggling with low mood. She is wondering if the Sertraline is making her feel flat. She is concerned about her fatigue and wonders if it is related to her depression. Discussed the idea of counseling and she declines for now, she will consider changes in meds if no improvement discussed the need for increased physical activity, a heart healthy diet with minimal carbohydrates and plenty of water and rest. Report if no improvement.      Hyperglycemia    hgba1c acceptable, minimize simple carbs. Increase exercise as tolerated.       Left shoulder pain    She is offered a referral to ortho but declines for now the discomfort is manageable. She will consider massage therapy and chiropractic. Encouraged moist heat  and gentle stretching as tolerated. May try  NSAIDs and prescription meds as directed and report if symptoms worsen or seek immediate care      Fatigue    Encouraged increased exercise, increased sleep, increased hydration, heart healthy diet such as MIND diet with minimal carbohydrate intake. Continue to monitor labs      Low vitamin B12 level    Supplement and monitor      Hyperlipidemia    Encouraged heart healthy diet, increase exercise, avoid trans fats, consider a krill oil cap daily      Left knee pain    Encouraged moist heat or cold and gentle stretching as tolerated. May try NSAIDs and prescription meds as directed and report if symptoms worsen or seek immediate care. Consider referral if worsens         Follow-up: Return in about 6 months (around 08/29/2021).   I,David Hanna,acting as a scribe for Penni Homans, MD.,have documented all relevant documentation on the behalf of Penni Homans, MD,as directed by  Penni Homans, MD while in the presence of Penni Homans, MD.  I, Mosie Lukes, MD personally performed the services described in this documentation. All medical record entries made by the scribe were at my direction and in my presence. I have reviewed the chart and agree that the record reflects my personal performance and is accurate and complete

## 2021-03-28 DIAGNOSIS — L821 Other seborrheic keratosis: Secondary | ICD-10-CM | POA: Diagnosis not present

## 2021-03-28 DIAGNOSIS — L814 Other melanin hyperpigmentation: Secondary | ICD-10-CM | POA: Diagnosis not present

## 2021-03-28 DIAGNOSIS — L570 Actinic keratosis: Secondary | ICD-10-CM | POA: Diagnosis not present

## 2021-04-16 DIAGNOSIS — R69 Illness, unspecified: Secondary | ICD-10-CM | POA: Diagnosis not present

## 2021-04-16 DIAGNOSIS — E785 Hyperlipidemia, unspecified: Secondary | ICD-10-CM | POA: Diagnosis not present

## 2021-04-16 DIAGNOSIS — Z833 Family history of diabetes mellitus: Secondary | ICD-10-CM | POA: Diagnosis not present

## 2021-04-16 DIAGNOSIS — G4700 Insomnia, unspecified: Secondary | ICD-10-CM | POA: Diagnosis not present

## 2021-04-16 DIAGNOSIS — Z823 Family history of stroke: Secondary | ICD-10-CM | POA: Diagnosis not present

## 2021-04-16 DIAGNOSIS — Z8249 Family history of ischemic heart disease and other diseases of the circulatory system: Secondary | ICD-10-CM | POA: Diagnosis not present

## 2021-04-16 DIAGNOSIS — Z809 Family history of malignant neoplasm, unspecified: Secondary | ICD-10-CM | POA: Diagnosis not present

## 2021-04-16 DIAGNOSIS — G8929 Other chronic pain: Secondary | ICD-10-CM | POA: Diagnosis not present

## 2021-04-30 ENCOUNTER — Other Ambulatory Visit: Payer: Self-pay | Admitting: Family Medicine

## 2021-04-30 DIAGNOSIS — E782 Mixed hyperlipidemia: Secondary | ICD-10-CM

## 2021-04-30 DIAGNOSIS — R030 Elevated blood-pressure reading, without diagnosis of hypertension: Secondary | ICD-10-CM

## 2021-04-30 DIAGNOSIS — R739 Hyperglycemia, unspecified: Secondary | ICD-10-CM

## 2021-04-30 DIAGNOSIS — M8580 Other specified disorders of bone density and structure, unspecified site: Secondary | ICD-10-CM

## 2021-04-30 DIAGNOSIS — Z23 Encounter for immunization: Secondary | ICD-10-CM

## 2021-05-01 NOTE — Telephone Encounter (Signed)
Requesting: alprazolam Contract:  UDS:01/15/21 Last Visit: 02/26/21 Next Visit: 08/29/21 Last Refill:  10/29/20  Please Advise

## 2021-06-05 DIAGNOSIS — H43822 Vitreomacular adhesion, left eye: Secondary | ICD-10-CM | POA: Diagnosis not present

## 2021-06-05 DIAGNOSIS — H35373 Puckering of macula, bilateral: Secondary | ICD-10-CM | POA: Diagnosis not present

## 2021-06-05 DIAGNOSIS — H2511 Age-related nuclear cataract, right eye: Secondary | ICD-10-CM | POA: Diagnosis not present

## 2021-06-05 DIAGNOSIS — Z961 Presence of intraocular lens: Secondary | ICD-10-CM | POA: Diagnosis not present

## 2021-06-12 ENCOUNTER — Other Ambulatory Visit: Payer: Self-pay | Admitting: Family Medicine

## 2021-06-12 NOTE — Telephone Encounter (Signed)
Requesting: ambien 10 mg Contract:01/28/18 UDS:01/15/21 Last Visit:02/26/21 Next Visit:08/29/21 Last Refill:10/29/20  Please Advise

## 2021-07-20 ENCOUNTER — Ambulatory Visit (INDEPENDENT_AMBULATORY_CARE_PROVIDER_SITE_OTHER): Payer: Medicare HMO

## 2021-07-20 VITALS — Ht 62.0 in | Wt 160.0 lb

## 2021-07-20 DIAGNOSIS — Z0000 Encounter for general adult medical examination without abnormal findings: Secondary | ICD-10-CM

## 2021-07-20 NOTE — Progress Notes (Signed)
Subjective:   Tricia Fleming is a 70 y.o. female who presents for an Initial Medicare Annual Wellness Visit.  Virtual Visit via Telephone Note  I connected with  Tricia Fleming on 07/20/21 at  8:40 AM EDT by telephone and verified that I am speaking with the correct person using two identifiers.  Location: Patient: Home Provider: [facility] Persons participating in the virtual visit: patient/Nurse Health Advisor   I discussed the limitations, risks, security and privacy concerns of performing an evaluation and management service by telephone and the availability of in person appointments. The patient expressed understanding and agreed to proceed.  Interactive audio and video telecommunications were attempted between this nurse and patient, however failed, due to patient having technical difficulties OR patient did not have access to video capability.  We continued and completed visit with audio only.  Some vital signs may be absent or patient reported.   Tricia Fleming E Tricia Kessner, LPN   Review of Systems     Cardiac Risk Factors include: advanced age (>37men, >47 women);dyslipidemia;sedentary lifestyle     Objective:    Today's Vitals   07/20/21 0843 07/20/21 0844  Weight: 160 lb (72.6 kg)   Height: 5\' 2"  (1.575 m)   PainSc:  5    Body mass index is 29.26 kg/m.  Advanced Directives 07/20/2021 11/27/2014  Does Patient Have a Medical Advance Directive? Yes No  Type of Paramedic of [place];Living will -  Does patient want to make changes to medical advance directive? - Yes - information given  Copy of [place] in Chart? No - copy requested No - copy requested  Would patient like information on creating a medical advance directive? - Yes - Educational materials given    Current Medications (verified) Outpatient Encounter Medications as of 07/20/2021  Medication Sig   acetaminophen (TYLENOL) 500 MG tablet Take 500 mg by mouth 2 (two) times daily  as needed.   ALPRAZolam (XANAX) 1 MG tablet TAKE 1 TABLET BY MOUTH 2 TIMES DAILY AS NEEDED FOR ANXIETY.   Biotin 5000 MCG CAPS Take by mouth.   fish oil-omega-3 fatty acids 1000 MG capsule Take 1,000 mg by mouth daily. 2 caps   nitroGLYCERIN (NITROSTAT) 0.4 MG SL tablet Place 1 tablet (0.4 mg total) under the tongue every 5 (five) minutes as needed for chest pain.   rosuvastatin (CRESTOR) 40 MG tablet Take 1 tablet (40 mg total) by mouth at bedtime.   sertraline (ZOLOFT) 100 MG tablet Take 1 tablet (100 mg total) by mouth daily.   Turmeric 500 MG CAPS Take 2 capsules by mouth daily.   zolpidem (AMBIEN) 10 MG tablet TAKE 1 TABLET (10 MG TOTAL) BY MOUTH AT BEDTIME AS NEEDED. FOR SLEEP   HYDROcodone-acetaminophen (NORCO/VICODIN) 5-325 MG tablet Take by mouth. (Patient not taking: Reported on 07/20/2021)   pantoprazole (PROTONIX) 40 MG tablet TAKE 1 TABLET BY MOUTH DAILY AS NEEDED (Patient not taking: Reported on 07/20/2021)   No facility-administered encounter medications on file as of 07/20/2021.    Allergies (verified) Meloxicam   History: Past Medical History:  Diagnosis Date   Anxiety    Atypical chest pain 12/08/2016   Breast lesion 06/18/2010   Qualifier: Diagnosis of  By: Charlett Blake MD, Stacey     Depression    Depression with anxiety 06/27/2011   Hyperglycemia 07/27/2013   Hyperlipidemia    Osteopenia 01/15/2016   Preventative health care 06/27/2011   Preventative health care 06/27/2011   SCC (squamous cell  carcinoma), leg 05/04/2014   SCC    Shoulder pain, bilateral 02/13/2013   Past Surgical History:  Procedure Laterality Date   BREAST BIOPSY Left    needle core biopsy, benign   CHOLECYSTECTOMY     TUBAL LIGATION     Family History  Problem Relation Age of Onset   Cancer Father        metastic prostate   Diabetes Mother    Hypertension Mother    Coronary artery disease Mother    Transient ischemic attack Sister    Heart disease Brother    Benign prostatic hyperplasia Brother     Lung disease Brother        in a smoker   Insulin resistance Brother    Hypertension Sister    Benign prostatic hyperplasia Brother    Alcohol abuse Brother    Arthritis Sister    Diabetes Maternal Grandmother    Coronary artery disease Maternal Grandmother    Diabetes Maternal Grandfather    Esophageal varices Brother    Cirrhosis Brother    Cancer Maternal Aunt    Birth defects Paternal Aunt        breast   Social History   Socioeconomic History   Marital status: Married    Spouse name: Not on file   Number of children: 3   Years of education: Not on file   Highest education level: Not on file  Occupational History   Occupation: retired  Tobacco Use   Smoking status: Never   Smokeless tobacco: Never  Substance and Sexual Activity   Alcohol use: Yes    Alcohol/week: 0.0 standard drinks    Comment: a couple beers a week   Drug use: No   Sexual activity: Yes    Partners: Male  Other Topics Concern   Not on file  Social History Narrative   Not on file   Social Determinants of Health   Financial Resource Strain: Low Risk    Difficulty of Paying Living Expenses: Not hard at all  Food Insecurity: Unknown   Worried About Charity fundraiser in the Last Year: Not on file   [employer] of Food in the Last Year: Never true  Transportation Needs: No Transportation Needs   Lack of Transportation (Medical): No   Lack of Transportation (Non-Medical): No  Physical Activity: Sufficiently Active   Days of Exercise per Week: 7 days   Minutes of Exercise per Session: 30 min  Stress: No Stress Concern Present   Feeling of Stress : Only a little  Social Connections: Moderately Isolated   Frequency of Communication with Friends and Family: More than three times a week   Frequency of Social Gatherings with Friends and Family: Twice a week   Attends Religious Services: Never   Marine scientist or Organizations: No   Attends Music therapist: Never   Marital  Status: Married    Tobacco Counseling Counseling given: Not Answered   Clinical Intake:  Pre-visit preparation completed: Yes  Pain : 0-10 Pain Score: 5  Pain Type: Chronic pain Pain Location: Generalized Pain Descriptors / Indicators: Aching, Sore, Discomfort Pain Onset: More than a month ago Pain Frequency: Intermittent     BMI - recorded: 29.26 Nutritional Status: BMI 25 -29 Overweight Nutritional Risks: None Diabetes: No  How often do you need to have someone help you when you read instructions, pamphlets, or other written materials from your doctor or pharmacy?: 1 - Never  Diabetic? No  Interpreter Needed?: No  Information entered by :: Clotile Whittington, LPN   Activities of Daily Living In your present state of health, do you have any difficulty performing the following activities: 07/20/2021 07/24/2020  Hearing? N N  Vision? N N  Difficulty concentrating or making decisions? Y N  Walking or climbing stairs? N N  Dressing or bathing? N N  Doing errands, shopping? N N  Preparing Food and eating ? N -  Using the Toilet? N -  In the past six months, have you accidently leaked urine? Y -  Comment mild - if she waits too long -  Do you have problems with loss of bowel control? N -  Managing your Medications? N -  Managing your Finances? N -  Housekeeping or managing your Housekeeping? N -  Some recent data might be hidden    Patient Care Team: Mosie Lukes, MD as PCP - General Shelly Rubenstein, NP as Nurse Practitioner (Dermatology)  Indicate any recent Medical Services you may have received from other than [facility] providers in the past year (date may be approximate).     Assessment:   This is a routine wellness examination for [place].  Hearing/Vision screen Hearing Screening - Comments:: Denies hearing difficulties Vision Screening - Comments:: Wears glasses prn - up to date with annual eye exams with [facility] in [place] issues and exercise  activities discussed: Current Exercise Habits: Home exercise routine, Type of exercise: Other - see comments;walking (babysits one year old grandson), Time (Minutes): 30, Frequency (Times/Week): 5, Weekly Exercise (Minutes/Week): 150, Intensity: Mild, Exercise limited by: psychological condition(s)   Goals Addressed             This Visit's Progress    Exercise 3x per week (30 min per time)         Depression Screen PHQ 2/9 Scores 07/20/2021 02/26/2021 11/20/2020 06/06/2020 01/28/2018 07/06/2015 07/06/2015  PHQ - 2 Score 4 4 2 6  0 0 0  PHQ- 9 Score 4 4 5 10  - - -    Fall Risk Fall Risk  07/20/2021 11/20/2020 07/24/2020 01/28/2018 07/06/2015  Falls in the past year? 0 0 - No No  Number falls in past yr: 0 0 0 - -  Injury with Fall? 0 0 0 - -  Risk for fall due to : No Fall Risks - - - -  Follow up Falls prevention discussed - - - -    FALL RISK PREVENTION PERTAINING TO THE HOME:  Any stairs in or around the home? Yes  If so, are there any without handrails? No  Home free of loose throw rugs in walkways, pet beds, electrical cords, etc? Yes  Adequate lighting in your home to reduce risk of falls? Yes   ASSISTIVE DEVICES UTILIZED TO PREVENT FALLS:  Life alert? Yes  Use of a cane, walker or w/c? No  Grab bars in the bathroom? Yes  Shower chair or bench in shower? No  Elevated toilet seat or a handicapped toilet? Yes   TIMED UP AND GO:  Was the test performed? No . Telephonic visit.  Cognitive Function:      6CIT Screen 07/20/2021  What Year? 0 points  What month? 0 points  What time? 0 points  Count back from 20 0 points  Months in reverse 0 points  Repeat phrase 2 points  Total Score 2    Immunizations Immunization History  Administered Date(s) Administered   Fluad Quad(high Dose 65+) 07/24/2020  Influenza Split 08/26/2011   Influenza, High Dose Seasonal PF 08/05/2018, 11/09/2019   Influenza,inj,Quad PF,6+ Mos 07/27/2013, 07/24/2014, 07/06/2015   Influenza-Unspecified  08/25/2018   PFIZER(Purple Top)SARS-COV-2 Vaccination 02/16/2020   Pneumococcal Conjugate-13 07/27/2013   Pneumococcal Polysaccharide-23 07/06/2015   Td 06/18/2010   Tdap 05/29/2020   Zoster Recombinat (Shingrix) 08/25/2018, 10/25/2018   Zoster, Live 01/15/2016    TDAP status: Up to date  Flu Vaccine status: Due, Education has been provided regarding the importance of this vaccine. Advised may receive this vaccine at local pharmacy or Health Dept. Aware to provide a copy of the vaccination record if obtained from local pharmacy or Health Dept. Verbalized acceptance and understanding.  Pneumococcal vaccine status: Up to date  Covid-19 vaccine status: Information provided on how to obtain vaccines.   Qualifies for Shingles Vaccine? Yes   Zostavax completed Yes   Shingrix Completed?: Yes  Screening Tests Health Maintenance  Topic Date Due   COVID-19 Vaccine (2 - Pfizer risk series) 03/08/2020   COLONOSCOPY (Pts 45-4yrs Insurance coverage will need to be confirmed)  06/11/2020   INFLUENZA VACCINE  05/27/2021   MAMMOGRAM  06/06/2022   TETANUS/TDAP  05/29/2030   DEXA SCAN  Completed   Hepatitis C Screening  Completed   Zoster Vaccines- Shingrix  Completed   HPV VACCINES  Aged Out    Health Maintenance  Health Maintenance Due  Topic Date Due   COVID-19 Vaccine (2 - Pfizer risk series) 03/08/2020   COLONOSCOPY (Pts 45-58yrs Insurance coverage will need to be confirmed)  06/11/2020   INFLUENZA VACCINE  05/27/2021    Cologuard ordered: She has this at home and just hasn't returned it yet  Mammogram status: Completed 06/06/2020. Repeat every year she plans to call and make appt this week  Bone Density status: Completed 03/15/2020. Results reflect: Bone density results: OSTEOPENIA. Repeat every 2 years.  Lung Cancer Screening: (Low Dose CT Chest recommended if Age 51-80 years, 30 pack-year currently smoking OR have quit w/in 15years.) does not qualify.   Additional  Screening:  Hepatitis C Screening: does qualify; Completed 07/06/2015  Vision Screening: Recommended annual ophthalmology exams for early detection of glaucoma and other disorders of the eye. Is the patient up to date with their annual eye exam?  Yes  Who is the provider or what is the name of the office in which the patient attends annual eye exams? [facility] in [place] If pt is not established with a provider, would they like to be referred to a provider to establish care? No .   Dental Screening: Recommended annual dental exams for proper oral hygiene  Community Resource Referral / Chronic Care Management: CRR required this visit?  No   CCM required this visit?  No      Plan:     I have personally reviewed and noted the following in the patient's chart:   Medical and social history Use of alcohol, tobacco or illicit drugs  Current medications and supplements including opioid prescriptions. Patient is not currently taking opioid prescriptions. Functional ability and status Nutritional status Physical activity Advanced directives List of other physicians Hospitalizations, surgeries, and ER visits in previous 12 months Vitals Screenings to include cognitive, depression, and falls Referrals and appointments  In addition, I have reviewed and discussed with patient certain preventive protocols, quality metrics, and best practice recommendations. A written personalized care plan for preventive services as well as general preventive health recommendations were provided to patient.     Tryphena Perkovich Dionne Ano, LPN  07/20/2021   Nurse Notes: She took a Vyvance her daughter had and it seemed to motivate her to get up and feel like doing things - wants to discuss getting rx for this or something similar at next visit - wakes up feeling achy and sore muscles and has no energy to do anything.

## 2021-07-20 NOTE — Patient Instructions (Signed)
Ms. Goedecke , Thank you for taking time to come for your Medicare Wellness Visit. I appreciate your ongoing commitment to your health goals. Please review the following plan we discussed and let me know if I can assist you in the future.   Screening recommendations/referrals: Colonoscopy: Done 06/11/2010 - Repeat in 10 years or do Cologuard every 3 years until age 71 Mammogram: done 06/06/2020 - Repeat annually *Due - make appointment soon Bone Density: Done 03/15/2020 - Repeat every 2 years Recommended yearly ophthalmology/optometry visit for glaucoma screening and checkup Recommended yearly dental visit for hygiene and checkup  Vaccinations: Influenza vaccine: Done 07/24/2020 - Repeat annually Pneumococcal vaccine: Done 07/27/2013 & 07/06/2015 Tdap vaccine: Done 05/29/2020 - Repeat in 10 years  Shingles vaccine: zostavax done 2017; Shingrix done 08/25/2018 & 10/25/2018   Covid-19:Done 02/16/2020 - due for booster  Advanced directives: Please bring a copy of your health care power of attorney and living will to the office to be added to your chart at your convenience.   Conditions/risks identified: Aim for 30 minutes of exercise or brisk walking each day, drink 6-8 glasses of water and eat lots of fruits and vegetables.   Next appointment: Follow up in one year for your annual wellness visit    Preventive Care 65 Years and Older, Female Preventive care refers to lifestyle choices and visits with your health care provider that can promote health and wellness. What does preventive care include? A yearly physical exam. This is also called an annual well check. Dental exams once or twice a year. Routine eye exams. Ask your health care provider how often you should have your eyes checked. Personal lifestyle choices, including: Daily care of your teeth and gums. Regular physical activity. Eating a healthy diet. Avoiding tobacco and drug use. Limiting alcohol use. Practicing safe sex. Taking  low-dose aspirin every day. Taking vitamin and mineral supplements as recommended by your health care provider. What happens during an annual well check? The services and screenings done by your health care provider during your annual well check will depend on your age, overall health, lifestyle risk factors, and family history of disease. Counseling  Your health care provider may ask you questions about your: Alcohol use. Tobacco use. Drug use. Emotional well-being. Home and relationship well-being. Sexual activity. Eating habits. History of falls. Memory and ability to understand (cognition). Work and work Statistician. Reproductive health. Screening  You may have the following tests or measurements: Height, weight, and BMI. Blood pressure. Lipid and cholesterol levels. These may be checked every 5 years, or more frequently if you are over 41 years old. Skin check. Lung cancer screening. You may have this screening every year starting at age 53 if you have a 30-pack-year history of smoking and currently smoke or have quit within the past 15 years. Fecal occult blood test (FOBT) of the stool. You may have this test every year starting at age 6. Flexible sigmoidoscopy or colonoscopy. You may have a sigmoidoscopy every 5 years or a colonoscopy every 10 years starting at age 32. Hepatitis C blood test. Hepatitis B blood test. Sexually transmitted disease (STD) testing. Diabetes screening. This is done by checking your blood sugar (glucose) after you have not eaten for a while (fasting). You may have this done every 1-3 years. Bone density scan. This is done to screen for osteoporosis. You may have this done starting at age 52. Mammogram. This may be done every 1-2 years. Talk to your health care provider about how often  you should have regular mammograms. Talk with your health care provider about your test results, treatment options, and if necessary, the need for more tests. Vaccines   Your health care provider may recommend certain vaccines, such as: Influenza vaccine. This is recommended every year. Tetanus, diphtheria, and acellular pertussis (Tdap, Td) vaccine. You may need a Td booster every 10 years. Zoster vaccine. You may need this after age 44. Pneumococcal 13-valent conjugate (PCV13) vaccine. One dose is recommended after age 31. Pneumococcal polysaccharide (PPSV23) vaccine. One dose is recommended after age 17. Talk to your health care provider about which screenings and vaccines you need and how often you need them. This information is not intended to replace advice given to you by your health care provider. Make sure you discuss any questions you have with your health care provider. Document Released: 11/09/2015 Document Revised: 07/02/2016 Document Reviewed: 08/14/2015 Elsevier Interactive Patient Education  2017 [place] Prevention in the Home Falls can cause injuries. They can happen to people of all ages. There are many things you can do to make your home safe and to help prevent falls. What can I do on the outside of my home? Regularly fix the edges of walkways and driveways and fix any cracks. Remove anything that might make you trip as you walk through a door, such as a raised step or threshold. Trim any bushes or trees on the path to your home. Use bright outdoor lighting. Clear any walking paths of anything that might make someone trip, such as rocks or tools. Regularly check to see if handrails are loose or broken. Make sure that both sides of any steps have handrails. Any raised decks and porches should have guardrails on the edges. Have any leaves, snow, or ice cleared regularly. Use sand or salt on walking paths during winter. Clean up any spills in your garage right away. This includes oil or grease spills. What can I do in the bathroom? Use night lights. Install grab bars by the toilet and in the tub and shower. Do not use towel  bars as grab bars. Use non-skid mats or decals in the tub or shower. If you need to sit down in the shower, use a plastic, non-slip stool. Keep the floor dry. Clean up any water that spills on the floor as soon as it happens. Remove soap buildup in the tub or shower regularly. Attach bath mats securely with double-sided non-slip rug tape. Do not have throw rugs and other things on the floor that can make you trip. What can I do in the bedroom? Use night lights. Make sure that you have a light by your bed that is easy to reach. Do not use any sheets or blankets that are too big for your bed. They should not hang down onto the floor. Have a firm chair that has side arms. You can use this for support while you get dressed. Do not have throw rugs and other things on the floor that can make you trip. What can I do in the kitchen? Clean up any spills right away. Avoid walking on wet floors. Keep items that you use a lot in easy-to-reach places. If you need to reach something above you, use a strong step stool that has a grab bar. Keep electrical cords out of the way. Do not use floor polish or wax that makes floors slippery. If you must use wax, use non-skid floor wax. Do not have throw rugs and other things on  the floor that can make you trip. What can I do with my stairs? Do not leave any items on the stairs. Make sure that there are handrails on both sides of the stairs and use them. Fix handrails that are broken or loose. Make sure that handrails are as long as the stairways. Check any carpeting to make sure that it is firmly attached to the stairs. Fix any carpet that is loose or worn. Avoid having throw rugs at the top or bottom of the stairs. If you do have throw rugs, attach them to the floor with carpet tape. Make sure that you have a light switch at the top of the stairs and the bottom of the stairs. If you do not have them, ask someone to add them for you. What else can I do to help  prevent falls? Wear shoes that: Do not have high heels. Have rubber bottoms. Are comfortable and fit you well. Are closed at the toe. Do not wear sandals. If you use a stepladder: Make sure that it is fully opened. Do not climb a closed stepladder. Make sure that both sides of the stepladder are locked into place. Ask someone to hold it for you, if possible. Clearly mark and make sure that you can see: Any grab bars or handrails. First and last steps. Where the edge of each step is. Use tools that help you move around (mobility aids) if they are needed. These include: Canes. Walkers. Scooters. Crutches. Turn on the lights when you go into a dark area. Replace any light bulbs as soon as they burn out. Set up your furniture so you have a clear path. Avoid moving your furniture around. If any of your floors are uneven, fix them. If there are any pets around you, be aware of where they are. Review your medicines with your doctor. Some medicines can make you feel dizzy. This can increase your chance of falling. Ask your doctor what other things that you can do to help prevent falls. This information is not intended to replace advice given to you by your health care provider. Make sure you discuss any questions you have with your health care provider. Document Released: 08/09/2009 Document Revised: 03/20/2016 Document Reviewed: 11/17/2014 Elsevier Interactive Patient Education  2017 [employer].

## 2021-07-28 ENCOUNTER — Other Ambulatory Visit: Payer: Self-pay | Admitting: Family Medicine

## 2021-07-30 ENCOUNTER — Other Ambulatory Visit: Payer: Self-pay | Admitting: Family Medicine

## 2021-08-19 ENCOUNTER — Telehealth: Payer: Self-pay | Admitting: Family Medicine

## 2021-08-19 NOTE — Telephone Encounter (Signed)
error 

## 2021-08-21 ENCOUNTER — Other Ambulatory Visit (HOSPITAL_BASED_OUTPATIENT_CLINIC_OR_DEPARTMENT_OTHER): Payer: Self-pay | Admitting: Family Medicine

## 2021-08-21 DIAGNOSIS — Z1231 Encounter for screening mammogram for malignant neoplasm of breast: Secondary | ICD-10-CM

## 2021-08-29 ENCOUNTER — Ambulatory Visit: Payer: Medicare HMO | Admitting: Family Medicine

## 2021-09-02 ENCOUNTER — Encounter: Payer: Self-pay | Admitting: Family Medicine

## 2021-09-02 ENCOUNTER — Ambulatory Visit (HOSPITAL_BASED_OUTPATIENT_CLINIC_OR_DEPARTMENT_OTHER)
Admission: RE | Admit: 2021-09-02 | Discharge: 2021-09-02 | Disposition: A | Discharge status: Home / Self Care | Payer: Medicare HMO | Source: Ambulatory Visit | Attending: Family Medicine | Admitting: Family Medicine

## 2021-09-02 ENCOUNTER — Other Ambulatory Visit: Payer: Self-pay

## 2021-09-02 ENCOUNTER — Ambulatory Visit (INDEPENDENT_AMBULATORY_CARE_PROVIDER_SITE_OTHER): Payer: Medicare HMO | Admitting: Family Medicine

## 2021-09-02 VITALS — BP 112/64 | HR 109 | Temp 98.1°F | Resp 16 | Wt 161.8 lb

## 2021-09-02 DIAGNOSIS — M2550 Pain in unspecified joint: Secondary | ICD-10-CM | POA: Diagnosis not present

## 2021-09-02 DIAGNOSIS — Z23 Encounter for immunization: Secondary | ICD-10-CM

## 2021-09-02 DIAGNOSIS — M545 Low back pain, unspecified: Secondary | ICD-10-CM

## 2021-09-02 DIAGNOSIS — E785 Hyperlipidemia, unspecified: Secondary | ICD-10-CM

## 2021-09-02 DIAGNOSIS — E538 Deficiency of other specified B group vitamins: Secondary | ICD-10-CM

## 2021-09-02 DIAGNOSIS — M542 Cervicalgia: Secondary | ICD-10-CM | POA: Insufficient documentation

## 2021-09-02 DIAGNOSIS — R519 Headache, unspecified: Secondary | ICD-10-CM | POA: Diagnosis not present

## 2021-09-02 DIAGNOSIS — R739 Hyperglycemia, unspecified: Secondary | ICD-10-CM

## 2021-09-02 DIAGNOSIS — R002 Palpitations: Secondary | ICD-10-CM | POA: Diagnosis not present

## 2021-09-02 DIAGNOSIS — Z1211 Encounter for screening for malignant neoplasm of colon: Secondary | ICD-10-CM

## 2021-09-02 DIAGNOSIS — G8929 Other chronic pain: Secondary | ICD-10-CM

## 2021-09-02 DIAGNOSIS — M791 Myalgia, unspecified site: Secondary | ICD-10-CM

## 2021-09-02 LAB — CBC
HCT: 41.8 % (ref 36.0–46.0)
Hemoglobin: 13.7 g/dL (ref 12.0–15.0)
MCHC: 32.7 g/dL (ref 30.0–36.0)
MCV: 87.3 fl (ref 78.0–100.0)
Platelets: 232 10*3/uL (ref 150.0–400.0)
RBC: 4.79 Mil/uL (ref 3.87–5.11)
RDW: 13.5 % (ref 11.5–15.5)
WBC: 5.3 10*3/uL (ref 4.0–10.5)

## 2021-09-02 LAB — TSH: TSH: 3.56 u[IU]/mL (ref 0.35–5.50)

## 2021-09-02 LAB — LIPID PANEL
Cholesterol: 179 mg/dL (ref 0–200)
HDL: 50.8 mg/dL (ref >39.00)
NonHDL: 128.16
Total CHOL/HDL Ratio: 4
Triglycerides: 306 mg/dL — ABNORMAL HIGH (ref 0.0–149.0)
VLDL: 61.2 mg/dL — ABNORMAL HIGH (ref 0.0–40.0)

## 2021-09-02 LAB — COMPREHENSIVE METABOLIC PANEL
ALT: 30 U/L (ref 0–35)
AST: 23 U/L (ref 0–37)
Albumin: 4.8 g/dL (ref 3.5–5.2)
Alkaline Phosphatase: 62 U/L (ref 39–117)
BUN: 21 mg/dL (ref 6–23)
CO2: 30 mEq/L (ref 19–32)
Calcium: 9.8 mg/dL (ref 8.4–10.5)
Chloride: 101 mEq/L (ref 96–112)
Creatinine, Ser: 0.63 mg/dL (ref 0.40–1.20)
GFR: 89.34 mL/min (ref >60.00)
Glucose, Bld: 89 mg/dL (ref 70–99)
Potassium: 4.2 mEq/L (ref 3.5–5.1)
Sodium: 138 mEq/L (ref 135–145)
Total Bilirubin: 0.3 mg/dL (ref 0.2–1.2)
Total Protein: 7.4 g/dL (ref 6.0–8.3)

## 2021-09-02 LAB — LDL CHOLESTEROL, DIRECT: Direct LDL: 96 mg/dL

## 2021-09-02 LAB — HIGH SENSITIVITY CRP: CRP, High Sensitivity: 2.92 mg/L (ref 0.000–5.000)

## 2021-09-02 LAB — CK: Total CK: 140 U/L (ref 7–177)

## 2021-09-02 LAB — SEDIMENTATION RATE: Sed Rate: 20 mm/hr (ref 0–30)

## 2021-09-02 NOTE — Progress Notes (Signed)
Subjective:   By signing my name below, I, Zite Okoli, attest that this documentation has been prepared under the direction and in the presence of Mosie Lukes, MD. 09/02/2021     Patient ID: Tricia Fleming, female    DOB: September 10, 1951, 70 y.o.   MRN: [id number]  Chief Complaint  Patient presents with   6 months follow up    HPI Patient is in today for an office visit and 6 month f/u.  She reports soreness and stiffness in her neck, shoulders, arms and back that radiates to the rest of her body. The soreness is reduced when she wakes up and starts to move around but is mild till it is tile to go to bed again. She adds that the pain was a quick onset but started 2 years ago in October while on family medication in [place]. She tried meloxicam without eating but cannot say if it really helped because it increased her anxiety. She takes a tylenol in the morning but has not noticed its effects. She rates the pain as the same all over her body but has worsened over the two years. She is also very fatigued and has had cold sweats for the past year. Headaches are occasional.  She reports she has been unfocused and very tired and is looking for medication to help her.  She has not been eating a healthy diet.  She denies SOB, chest pain, loss of appetite and palpitations. Denies recent ER visits.   She has received the flu vaccine. She has 1 Covid-19 vaccine at this time and will receive the booster vaccine this week.  Past Medical History:  Diagnosis Date   Anxiety    Atypical chest pain 12/08/2016   Breast lesion 06/18/2010   Qualifier: Diagnosis of  By: Charlett Blake MD, Shritha Bresee     Depression    Depression with anxiety 06/27/2011   Hyperglycemia 07/27/2013   Hyperlipidemia    Osteopenia 01/15/2016   Preventative health care 06/27/2011   Preventative health care 06/27/2011   SCC (squamous cell carcinoma), leg 05/04/2014   SCC    Shoulder pain, bilateral 02/13/2013    Past Surgical History:   Procedure Laterality Date   BREAST BIOPSY Left    needle core biopsy, benign   CHOLECYSTECTOMY     TUBAL LIGATION      Family History  Problem Relation Age of Onset   Cancer Father        metastic prostate   Diabetes Mother    Hypertension Mother    Coronary artery disease Mother    Transient ischemic attack Sister    Heart disease Brother    Benign prostatic hyperplasia Brother    Lung disease Brother        in a smoker   Insulin resistance Brother    Hypertension Sister    Benign prostatic hyperplasia Brother    Alcohol abuse Brother    Arthritis Sister    Diabetes Maternal Grandmother    Coronary artery disease Maternal Grandmother    Diabetes Maternal Grandfather    Esophageal varices Brother    Cirrhosis Brother    Cancer Maternal Aunt    Birth defects Paternal Aunt        breast    Social History   Socioeconomic History   Marital status: Married    Spouse name: Not on file   Number of children: 3   Years of education: Not on file   Highest education level: Not on  file  Occupational History   Occupation: retired  Tobacco Use   Smoking status: Never   Smokeless tobacco: Never  Substance and Sexual Activity   Alcohol use: Yes    Alcohol/week: 0.0 standard drinks    Comment: a couple beers a week   Drug use: No   Sexual activity: Yes    Partners: Male  Other Topics Concern   Not on file  Social History Narrative   Not on file   Social Determinants of Health   Financial Resource Strain: Low Risk    Difficulty of Paying Living Expenses: Not hard at all  Food Insecurity: Unknown   Worried About Charity fundraiser in the Last Year: Not on file   [employer] of Food in the Last Year: Never true  Transportation Needs: No Transportation Needs   Lack of Transportation (Medical): No   Lack of Transportation (Non-Medical): No  Physical Activity: Sufficiently Active   Days of Exercise per Week: 7 days   Minutes of Exercise per Session: 30 min  Stress: No  Stress Concern Present   Feeling of Stress : Only a little  Social Connections: Moderately Isolated   Frequency of Communication with Friends and Family: More than three times a week   Frequency of Social Gatherings with Friends and Family: Twice a week   Attends Religious Services: Never   Marine scientist or Organizations: No   Attends Music therapist: Never   Marital Status: Married  Human resources officer Violence: Not At Risk   Fear of Current or Ex-Partner: No   Emotionally Abused: No   Physically Abused: No   Sexually Abused: No    Outpatient Medications Prior to Visit  Medication Sig Dispense Refill   acetaminophen (TYLENOL) 500 MG tablet Take 500 mg by mouth 2 (two) times daily as needed.     ALPRAZolam (XANAX) 1 MG tablet TAKE 1 TABLET BY MOUTH 2 TIMES DAILY AS NEEDED FOR ANXIETY. 180 tablet 1   Biotin 5000 MCG CAPS Take by mouth.     fish oil-omega-3 fatty acids 1000 MG capsule Take 1,000 mg by mouth daily. 2 caps     nitroGLYCERIN (NITROSTAT) 0.4 MG SL tablet Place 1 tablet (0.4 mg total) under the tongue every 5 (five) minutes as needed for chest pain. 25 tablet 1   rosuvastatin (CRESTOR) 40 MG tablet TAKE 1 TABLET BY MOUTH EVERYDAY AT BEDTIME 90 tablet 1   sertraline (ZOLOFT) 100 MG tablet TAKE 1 TABLET BY MOUTH EVERY DAY 90 tablet 1   Turmeric 500 MG CAPS Take 2 capsules by mouth daily.     zolpidem (AMBIEN) 10 MG tablet TAKE 1 TABLET (10 MG TOTAL) BY MOUTH AT BEDTIME AS NEEDED. FOR SLEEP 90 tablet 1   HYDROcodone-acetaminophen (NORCO/VICODIN) 5-325 MG tablet Take by mouth. (Patient not taking: Reported on 07/20/2021)     pantoprazole (PROTONIX) 40 MG tablet TAKE 1 TABLET BY MOUTH DAILY AS NEEDED (Patient not taking: Reported on 07/20/2021) 90 tablet 1   No facility-administered medications prior to visit.    Allergies  Allergen Reactions   Meloxicam Other (See Comments)    "HEADACHE, POOR APPETITE, AND ANXIOUS"    Review of Systems  Constitutional:   Positive for malaise/fatigue. Negative for fever.       (+) cold sweats  HENT:  Negative for congestion.   Eyes:  Negative for redness.  Respiratory:  Negative for shortness of breath.   Cardiovascular:  Negative for chest pain, palpitations and  leg swelling.  Gastrointestinal:  Negative for abdominal pain, blood in stool and nausea.  Genitourinary:  Negative for dysuria and frequency.  Musculoskeletal:  Positive for back pain (lower), joint pain (total body), myalgias (total body) and neck pain. Negative for falls.  Skin:  Negative for rash.  Neurological:  Negative for dizziness, loss of consciousness and headaches.  Endo/Heme/Allergies:  Negative for polydipsia.  Psychiatric/Behavioral:  Negative for depression. The patient is not nervous/anxious.       Objective:    Physical Exam Constitutional:      General: She is not in acute distress.    Appearance: She is well-developed.  HENT:     Head: Normocephalic and atraumatic.  Eyes:     Conjunctiva/sclera: Conjunctivae normal.  Neck:     Thyroid: No thyromegaly.  Cardiovascular:     Rate and Rhythm: Normal rate and regular rhythm.     Heart sounds: Normal heart sounds. No murmur heard. Pulmonary:     Effort: Pulmonary effort is normal. No respiratory distress.     Breath sounds: Normal breath sounds.  Abdominal:     General: Bowel sounds are normal. There is no distension.     Palpations: Abdomen is soft. There is no mass.     Tenderness: There is no abdominal tenderness.  Musculoskeletal:     Cervical back: Neck supple.  Lymphadenopathy:     Cervical: No cervical adenopathy.  Skin:    General: Skin is warm and dry.  Neurological:     Mental Status: She is alert and oriented to person, place, and time.  Psychiatric:        Behavior: Behavior normal.    BP 112/64   Pulse (!) 109   Temp 98.1 F (36.7 C)   Resp 16   Wt 161 lb 12.8 oz (73.4 kg)   SpO2 97%   BMI 29.59 kg/m  Wt Readings from Last 3 Encounters:   09/02/21 161 lb 12.8 oz (73.4 kg)  07/20/21 160 lb (72.6 kg)  02/26/21 156 lb 9.6 oz (71 kg)    Diabetic Foot Exam - Simple   No data filed    Lab Results  Component Value Date   WBC 5.3 09/02/2021   HGB 13.7 09/02/2021   HCT 41.8 09/02/2021   PLT 232.0 09/02/2021   GLUCOSE 89 09/02/2021   CHOL 179 09/02/2021   TRIG 306.0 (H) 09/02/2021   HDL 50.80 09/02/2021   LDLDIRECT 96.0 09/02/2021   LDLCALC 138 (H) 07/24/2020   ALT 30 09/02/2021   AST 23 09/02/2021   NA 138 09/02/2021   K 4.2 09/02/2021   CL 101 09/02/2021   CREATININE 0.63 09/02/2021   BUN 21 09/02/2021   CO2 30 09/02/2021   TSH 3.56 09/02/2021   HGBA1C 6.2 02/20/2021   MICROALBUR <0.7 07/06/2015    Lab Results  Component Value Date   TSH 3.56 09/02/2021   Lab Results  Component Value Date   WBC 5.3 09/02/2021   HGB 13.7 09/02/2021   HCT 41.8 09/02/2021   MCV 87.3 09/02/2021   PLT 232.0 09/02/2021   Lab Results  Component Value Date   NA 138 09/02/2021   K 4.2 09/02/2021   CO2 30 09/02/2021   GLUCOSE 89 09/02/2021   BUN 21 09/02/2021   CREATININE 0.63 09/02/2021   BILITOT 0.3 09/02/2021   ALKPHOS 62 09/02/2021   AST 23 09/02/2021   ALT 30 09/02/2021   PROT 7.4 09/02/2021   ALBUMIN 4.8 09/02/2021   CALCIUM 9.8  09/02/2021   GFR 89.34 09/02/2021   Lab Results  Component Value Date   CHOL 179 09/02/2021   Lab Results  Component Value Date   HDL 50.80 09/02/2021   Lab Results  Component Value Date   LDLCALC 138 (H) 07/24/2020   Lab Results  Component Value Date   TRIG 306.0 (H) 09/02/2021   Lab Results  Component Value Date   CHOLHDL 4 09/02/2021   Lab Results  Component Value Date   HGBA1C 6.2 02/20/2021       Assessment & Plan:   Problem List Items Addressed This Visit     Hyperglycemia    hgba1c acceptable, minimize simple carbs. Increase exercise as tolerated.       Relevant Orders   Comprehensive metabolic panel (Completed)   Palpitations   Relevant Orders    TSH (Completed)   Low vitamin B12 level    Supplement and monitor      Hyperlipidemia    Encourage heart healthy diet such as MIND or DASH diet, increase exercise, avoid trans fats, simple carbohydrates and processed foods, consider a krill or fish or flaxseed oil cap daily. Has been taking Rosuvastatin 40 mg but is now complaining of worsening arthralgias and myalgias she is asked to stop for a month and see if her pain improves, hydrate and let us know how she is doing so we can plan what next steps should be      Relevant Orders   Lipid panel (Completed)   Myalgia    Present this past year. Hold statin for a month and with low back and neck worst, check labs and xrays      Relevant Orders   Ehrlichia antibody panel   Rocky mtn spotted fvr abs pnl(IgG+IgM)   CRP High sensitivity (Completed)   Sedimentation rate (Completed)   CK (Creatine Kinase) (Completed)   B. burgdorfi antibodies   Other Visit Diagnoses     Need for influenza vaccination    -  Primary   Relevant Orders   Flu Vaccine QUAD High Dose(Fluad) (Completed)   Neck pain on left side       Relevant Orders   DG Cervical Spine Complete   CK (Creatine Kinase) (Completed)   B. burgdorfi antibodies   Chronic low back pain, unspecified back pain laterality, unspecified whether sciatica present       Relevant Orders   CBC (Completed)   DG Lumbar Spine 2-3 Views   Arthralgia, unspecified joint       Relevant Orders   Ehrlichia antibody panel   Rocky mtn spotted fvr abs pnl(IgG+IgM)   CRP High sensitivity (Completed)   Sedimentation rate (Completed)   CK (Creatine Kinase) (Completed)   B. burgdorfi antibodies   Nonintractable headache, unspecified chronicity pattern, unspecified headache type       Relevant Orders   CK (Creatine Kinase) (Completed)   B. burgdorfi antibodies   Colon cancer screening       Relevant Orders   Ambulatory referral to Gastroenterology       No orders of the defined types were  placed in this encounter.   I,Zite Okoli,acting as a Education administrator for Penni Homans, MD.,have documented all relevant documentation on the behalf of Penni Homans, MD,as directed by  Penni Homans, MD while in the presence of Penni Homans, MD.   I, Mosie Lukes, MD., personally preformed the services described in this documentation.  All medical record entries made by the scribe were at my direction and in  my presence.  I have reviewed the chart and discharge instructions (if applicable) and agree that the record reflects my personal performance and is accurate and complete. 09/02/2021

## 2021-09-02 NOTE — Assessment & Plan Note (Signed)
hgba1c acceptable, minimize simple carbs. Increase exercise as tolerated.  

## 2021-09-02 NOTE — Assessment & Plan Note (Signed)
>>  ASSESSMENT AND PLAN FOR MIXED DIABETIC HYPERLIPIDEMIA ASSOCIATED WITH TYPE 2 DIABETES MELLITUS (HCC) WRITTEN ON 09/02/2021 10:47 PM BY BLYTH, STACEY A, MD  hgba1c acceptable, minimize simple carbs. Increase exercise as tolerated.

## 2021-09-02 NOTE — Assessment & Plan Note (Signed)
Encourage heart healthy diet such as MIND or DASH diet, increase exercise, avoid trans fats, simple carbohydrates and processed foods, consider a krill or fish or flaxseed oil cap daily. Has been taking Rosuvastatin 40 mg but is now complaining of worsening arthralgias and myalgias she is asked to stop for a month and see if her pain improves, hydrate and let us know how she is doing so we can plan what next steps should be

## 2021-09-02 NOTE — Assessment & Plan Note (Signed)
Supplement and monitor 

## 2021-09-02 NOTE — Patient Instructions (Addendum)
Hold the Crestor for the next month and let us know if the pain improves  Muscle Pain, Adult Muscle pain, also called myalgia, is a condition in which a person has pain in one or more muscles in the body. Muscle pain may be mild, moderate, or severe. It may feel sharp, achy, or burning. In most cases, the pain lasts only a short time and goes away without treatment. Muscle pain can result from using muscles in a new or different way or after a period of inactivity. It is normal to feel some muscle pain after starting an exercise program. Muscles that have not been used often will be sore at first. What are the causes? This condition is caused by using muscles in a new or different way after a period of inactivity. Other causes may include: Overuse or muscle strain, especially if you are not in shape. This is the most common cause of muscle pain. Injury or bruising. Infectious diseases, including diseases caused by viruses, such as the flu (influenza). Fibromyalgia.This is a long-term, or chronic, condition that causes muscle tenderness, tiredness (fatigue), and headache. Autoimmune or rheumatologic diseases. These are conditions, such as lupus, in which the body's defense system (immunesystem) attacks areas in the body. Certain medicines, including ACE inhibitors and statins. What are the signs or symptoms? The main symptom of this condition is sore or painful muscles, including during activity and when stretching. You may also have slight swelling. How is this diagnosed? This condition is diagnosed with a physical exam. Your health care provider will ask questions about your pain and when it began. If you have not had muscle pain for very long, your health care provider may want to wait before doing much testing. If your muscle pain has lasted a long time, tests may be done right away. In some cases, this may include tests to rule out certain conditions or illnesses. How is this treated? Treatment  for this condition depends on the cause. Home care is often enough to relieve muscle pain. Your health care provider may also prescribe NSAIDs, such as ibuprofen. Follow these instructions at home: Medicines Take over-the-counter and prescription medicines only as told by your health care provider. Ask your health care provider if the medicine prescribed to you requires you to avoid driving or using machinery. Managing pain, swelling, and discomfort   If directed, put ice on the painful area. To do this: Put ice in a plastic bag. Place a towel between your skin and the bag. Leave the ice on for 20 minutes, 2-3 times a day. For the first 2 days of muscle soreness, or if there is swelling: Do not soak in hot baths. Do not use a hot tub, steam room, sauna, heating pad, or other heat source. After 48-72 hours, you may alternate between applying ice and applying heat as told by your health care provider. If directed, apply heat to the affected area as often as told by your health care provider. Use the heat source that your health care provider recommends, such as a moist heat pack or a heating pad. Place a towel between your skin and the heat source. Leave the heat on for 20-30 minutes. Remove the heat if your skin turns bright red. This is especially important if you are unable to feel pain, heat, or cold. You may have a greater risk of getting burned. If you have an injury, raise (elevate) the injured area above the level of your heart while you are sitting  or lying down. Activity  If overuse is causing your muscle pain: Slow down your activities until the pain goes away. Do regular, gentle exercises if you are not usually active. Warm up before exercising. Stretch before and after exercising. This can help lower the risk of muscle pain. Do not continue working out if the pain is severe. Severe pain could mean that you have injured a muscle. Do not lift anything that is heavier than 5-10 lb  (2.3-4.5 kg), or the limit that you are told, until your health care provider says that it is safe. Return to your normal activities as told by your health care provider. Ask your health care provider what activities are safe for you. General instructions Do not use any products that contain nicotine or tobacco, such as cigarettes, e-cigarettes, and chewing tobacco. These can delay healing. If you need help quitting, ask your health care provider. Keep all follow-up visits as told by your health care provider. This is important. Contact a health care provider if you have: Muscle pain that gets worse and medicines do not help. Muscle pain that lasts longer than 3 days. A rash or fever along with muscle pain. Muscle pain after a tick bite. Muscle pain while working out, even though you are in good physical condition. Redness, soreness, or swelling along with muscle pain. Muscle pain after starting a new medicine or changing the dose of a medicine. Get help right away if you have: Trouble breathing. Trouble swallowing. Muscle pain along with a stiff neck, fever, and vomiting. Severe muscle weakness or you cannot move part of your body. These symptoms may represent a serious problem that is an emergency. Do not wait to see if the symptoms will go away. Get medical help right away. Call your local emergency services (911 in the [place] Do not drive yourself to the hospital. Summary Muscle pain usually lasts only a short time and goes away without treatment. This condition is caused by using muscles in a new or different way after a period of inactivity. If your muscle pain lasts longer than 3 days, tell your health care provider. This information is not intended to replace advice given to you by your health care provider. Make sure you discuss any questions you have with your health care provider. Document Revised: 05/03/2021 Document Reviewed: 05/03/2021 Elsevier Patient Education  [employer].

## 2021-09-02 NOTE — Assessment & Plan Note (Signed)
Present this past year. Hold statin for a month and with low back and neck worst, check labs and xrays

## 2021-09-03 ENCOUNTER — Other Ambulatory Visit: Payer: Self-pay | Admitting: Family Medicine

## 2021-09-03 DIAGNOSIS — R918 Other nonspecific abnormal finding of lung field: Secondary | ICD-10-CM

## 2021-09-03 DIAGNOSIS — M47812 Spondylosis without myelopathy or radiculopathy, cervical region: Secondary | ICD-10-CM

## 2021-09-03 DIAGNOSIS — M47817 Spondylosis without myelopathy or radiculopathy, lumbosacral region: Secondary | ICD-10-CM

## 2021-09-05 ENCOUNTER — Other Ambulatory Visit: Payer: Self-pay

## 2021-09-05 ENCOUNTER — Ambulatory Visit (HOSPITAL_BASED_OUTPATIENT_CLINIC_OR_DEPARTMENT_OTHER)
Admission: RE | Admit: 2021-09-05 | Discharge: 2021-09-05 | Disposition: A | Discharge status: Home / Self Care | Payer: Medicare HMO | Source: Ambulatory Visit | Attending: Family Medicine | Admitting: Family Medicine

## 2021-09-05 DIAGNOSIS — R918 Other nonspecific abnormal finding of lung field: Secondary | ICD-10-CM | POA: Diagnosis not present

## 2021-09-05 DIAGNOSIS — Z0389 Encounter for observation for other suspected diseases and conditions ruled out: Secondary | ICD-10-CM | POA: Diagnosis not present

## 2021-09-05 DIAGNOSIS — Z9049 Acquired absence of other specified parts of digestive tract: Secondary | ICD-10-CM | POA: Diagnosis not present

## 2021-09-10 ENCOUNTER — Telehealth: Payer: Self-pay

## 2021-09-10 ENCOUNTER — Other Ambulatory Visit: Payer: Self-pay | Admitting: Family Medicine

## 2021-09-10 DIAGNOSIS — M542 Cervicalgia: Secondary | ICD-10-CM

## 2021-09-10 DIAGNOSIS — M545 Low back pain, unspecified: Secondary | ICD-10-CM

## 2021-09-10 LAB — EHRLICHIA ANTIBODY PANEL
E. CHAFFEENSIS AB IGG: <1:64 {titer}
E. CHAFFEENSIS AB IGM: <1:20 {titer}

## 2021-09-10 LAB — ROCKY MTN SPOTTED FVR ABS PNL(IGG+IGM)
RMSF IgG: NOT DETECTED
RMSF IgM: NOT DETECTED

## 2021-09-10 LAB — B. BURGDORFI ANTIBODIES: B burgdorferi Ab IgG+IgM: <0.9 index

## 2021-09-10 NOTE — Telephone Encounter (Signed)
  Please advise the MRI was denied. I sent and in basket message and also a teams to get update if peer to peer would be done. I did not get a response. Please provide update peer to peer information  [phone] information also put in Dr. B tray as well.

## 2021-09-10 NOTE — Telephone Encounter (Signed)
York Spaniel, MD; Willetta York, Inda Castle A, CMA Please advise the MRI was denied. I sent and in basket message and also a teams to get update if peer to peer would be done. I did not get a response. Please provide update peer to peer information  [phone] information also put in Dr. B tray as well  Lvm to call back

## 2021-09-24 ENCOUNTER — Other Ambulatory Visit: Payer: Self-pay

## 2021-09-24 ENCOUNTER — Ambulatory Visit (HOSPITAL_BASED_OUTPATIENT_CLINIC_OR_DEPARTMENT_OTHER)
Admission: RE | Admit: 2021-09-24 | Discharge: 2021-09-24 | Disposition: A | Discharge status: Home / Self Care | Payer: Medicare HMO | Source: Ambulatory Visit | Attending: Family Medicine | Admitting: Family Medicine

## 2021-09-24 DIAGNOSIS — Z1231 Encounter for screening mammogram for malignant neoplasm of breast: Secondary | ICD-10-CM | POA: Diagnosis not present

## 2021-10-29 ENCOUNTER — Other Ambulatory Visit: Payer: Self-pay | Admitting: Family Medicine

## 2021-10-29 DIAGNOSIS — M8580 Other specified disorders of bone density and structure, unspecified site: Secondary | ICD-10-CM

## 2021-10-29 DIAGNOSIS — R030 Elevated blood-pressure reading, without diagnosis of hypertension: Secondary | ICD-10-CM

## 2021-10-29 DIAGNOSIS — Z23 Encounter for immunization: Secondary | ICD-10-CM

## 2021-10-29 DIAGNOSIS — E782 Mixed hyperlipidemia: Secondary | ICD-10-CM

## 2021-10-29 DIAGNOSIS — R739 Hyperglycemia, unspecified: Secondary | ICD-10-CM

## 2021-10-29 NOTE — Telephone Encounter (Signed)
Requesting: alprazolam 1mg   Contract: 01/28/2018 UDS: 01/15/2021 Last Visit: 09/02/2021 Next Visit: 12/05/2021 Last Refill: 05/01/2021 #180 and 1RF  Please Advise

## 2021-11-06 DIAGNOSIS — R202 Paresthesia of skin: Secondary | ICD-10-CM | POA: Diagnosis not present

## 2021-11-06 DIAGNOSIS — M5416 Radiculopathy, lumbar region: Secondary | ICD-10-CM | POA: Diagnosis not present

## 2021-11-06 DIAGNOSIS — M542 Cervicalgia: Secondary | ICD-10-CM | POA: Diagnosis not present

## 2021-11-06 DIAGNOSIS — M5412 Radiculopathy, cervical region: Secondary | ICD-10-CM | POA: Diagnosis not present

## 2021-11-06 DIAGNOSIS — M545 Low back pain, unspecified: Secondary | ICD-10-CM | POA: Diagnosis not present

## 2021-11-15 DIAGNOSIS — M4722 Other spondylosis with radiculopathy, cervical region: Secondary | ICD-10-CM | POA: Diagnosis not present

## 2021-11-15 DIAGNOSIS — M4726 Other spondylosis with radiculopathy, lumbar region: Secondary | ICD-10-CM | POA: Diagnosis not present

## 2021-12-03 DIAGNOSIS — M545 Low back pain, unspecified: Secondary | ICD-10-CM | POA: Diagnosis not present

## 2021-12-03 DIAGNOSIS — Z8249 Family history of ischemic heart disease and other diseases of the circulatory system: Secondary | ICD-10-CM | POA: Diagnosis not present

## 2021-12-03 DIAGNOSIS — F324 Major depressive disorder, single episode, in partial remission: Secondary | ICD-10-CM | POA: Diagnosis not present

## 2021-12-03 DIAGNOSIS — R030 Elevated blood-pressure reading, without diagnosis of hypertension: Secondary | ICD-10-CM | POA: Diagnosis not present

## 2021-12-03 DIAGNOSIS — E669 Obesity, unspecified: Secondary | ICD-10-CM | POA: Diagnosis not present

## 2021-12-03 DIAGNOSIS — M1990 Unspecified osteoarthritis, unspecified site: Secondary | ICD-10-CM | POA: Diagnosis not present

## 2021-12-03 DIAGNOSIS — Z791 Long term (current) use of non-steroidal anti-inflammatories (NSAID): Secondary | ICD-10-CM | POA: Diagnosis not present

## 2021-12-03 DIAGNOSIS — F419 Anxiety disorder, unspecified: Secondary | ICD-10-CM | POA: Diagnosis not present

## 2021-12-03 DIAGNOSIS — Z6830 Body mass index (BMI) 30.0-30.9, adult: Secondary | ICD-10-CM | POA: Diagnosis not present

## 2021-12-03 DIAGNOSIS — E785 Hyperlipidemia, unspecified: Secondary | ICD-10-CM | POA: Diagnosis not present

## 2021-12-03 DIAGNOSIS — R69 Illness, unspecified: Secondary | ICD-10-CM | POA: Diagnosis not present

## 2021-12-03 DIAGNOSIS — K219 Gastro-esophageal reflux disease without esophagitis: Secondary | ICD-10-CM | POA: Diagnosis not present

## 2021-12-03 DIAGNOSIS — G8929 Other chronic pain: Secondary | ICD-10-CM | POA: Diagnosis not present

## 2021-12-05 ENCOUNTER — Telehealth (INDEPENDENT_AMBULATORY_CARE_PROVIDER_SITE_OTHER): Payer: Medicare HMO | Admitting: Family Medicine

## 2021-12-05 ENCOUNTER — Telehealth: Payer: Self-pay

## 2021-12-05 DIAGNOSIS — R5383 Other fatigue: Secondary | ICD-10-CM | POA: Diagnosis not present

## 2021-12-05 DIAGNOSIS — R739 Hyperglycemia, unspecified: Secondary | ICD-10-CM | POA: Diagnosis not present

## 2021-12-05 DIAGNOSIS — F418 Other specified anxiety disorders: Secondary | ICD-10-CM

## 2021-12-05 DIAGNOSIS — E538 Deficiency of other specified B group vitamins: Secondary | ICD-10-CM

## 2021-12-05 DIAGNOSIS — R7989 Other specified abnormal findings of blood chemistry: Secondary | ICD-10-CM

## 2021-12-05 DIAGNOSIS — E785 Hyperlipidemia, unspecified: Secondary | ICD-10-CM | POA: Diagnosis not present

## 2021-12-05 DIAGNOSIS — R69 Illness, unspecified: Secondary | ICD-10-CM | POA: Diagnosis not present

## 2021-12-05 MED ORDER — METHYLPHENIDATE HCL 5 MG PO TABS: 5.0000 mg | ORAL_TABLET | Freq: Two times a day (BID) | ORAL | 0 refills | Status: DC

## 2021-12-05 NOTE — Telephone Encounter (Signed)
Lvm to call back to get roomed for vv

## 2021-12-05 NOTE — Patient Instructions (Signed)

## 2021-12-06 ENCOUNTER — Telehealth: Payer: Self-pay

## 2021-12-06 NOTE — Telephone Encounter (Signed)
Pt scheduled labs and bp check 3/14. Advised pt nurse will call her back set up ov with dr. Charlett Blake 2-4 wks after. Pt is already scheduled a cpe 5/18. Please advise.

## 2021-12-06 NOTE — Telephone Encounter (Signed)
Called pt to set up  1 month nurse visit for BP and pulse check and lab visit for cmp, cbc, lipid, tsh, hgba1c the a VV 2-4 weeks after that

## 2021-12-08 NOTE — Assessment & Plan Note (Signed)
Will attempt a trial of Ritalin 5 mg po bid, she is aware of the risks and chooses to proceed.

## 2021-12-08 NOTE — Progress Notes (Signed)
MyChart Video Visit    Virtual Visit via Video Note   This visit type was conducted due to national recommendations for restrictions regarding the COVID-19 Pandemic (e.g. social distancing) in an effort to limit this patient's exposure and mitigate transmission in our community. This patient is at least at moderate risk for complications without adequate follow up. This format is felt to be most appropriate for this patient at this time. Physical exam was limited by quality of the video and audio technology used for the visit. [name], CMA was able to get the patient set up on a video visit.  Patient location: home Patient and provider in visit Provider location: Office  I discussed the limitations of evaluation and management by telemedicine and the availability of in person appointments. The patient expressed understanding and agreed to proceed.  Visit Date: 12/05/2021  Today's healthcare provider: Penni Homans, MD     Subjective:    Patient ID: Tricia Fleming, female    DOB: 11/01/1950, 71 y.o.   MRN: [id number]  Chief Complaint  Patient presents with   3 months follow up    Pt would like to discuss ongoing aches and pains    HPI Patient is in today for follow up on chronic medical concerns.no recent febrile illness or hospitalizations. No polyuria or polydipsia complaints. She continues to struggle with anxiety, depression and fatigue. Denies CP/palp/SOB/HA/congestion/fevers/GI or GU c/o. Taking meds as prescribed   Past Medical History:  Diagnosis Date   Anxiety    Atypical chest pain 12/08/2016   Breast lesion 06/18/2010   Qualifier: Diagnosis of  By: Charlett Blake MD, Nidal Rivet     Depression    Depression with anxiety 06/27/2011   Hyperglycemia 07/27/2013   Hyperlipidemia    Osteopenia 01/15/2016   Preventative health care 06/27/2011   Preventative health care 06/27/2011   SCC (squamous cell carcinoma), leg 05/04/2014   SCC    Shoulder pain, bilateral 02/13/2013    Past  Surgical History:  Procedure Laterality Date   BREAST BIOPSY Left    needle core biopsy, benign   CHOLECYSTECTOMY     TUBAL LIGATION      Family History  Problem Relation Age of Onset   Cancer Father        metastic prostate   Diabetes Mother    Hypertension Mother    Coronary artery disease Mother    Transient ischemic attack Sister    Heart disease Brother    Benign prostatic hyperplasia Brother    Lung disease Brother        in a smoker   Insulin resistance Brother    Hypertension Sister    Benign prostatic hyperplasia Brother    Alcohol abuse Brother    Arthritis Sister    Diabetes Maternal Grandmother    Coronary artery disease Maternal Grandmother    Diabetes Maternal Grandfather    Esophageal varices Brother    Cirrhosis Brother    Cancer Maternal Aunt    Birth defects Paternal Aunt        breast    Social History   Socioeconomic History   Marital status: Married    Spouse name: Not on file   Number of children: 3   Years of education: Not on file   Highest education level: Not on file  Occupational History   Occupation: retired  Tobacco Use   Smoking status: Never   Smokeless tobacco: Never  Substance and Sexual Activity   Alcohol use: Yes  Alcohol/week: 0.0 standard drinks    Comment: a couple beers a week   Drug use: No   Sexual activity: Yes    Partners: Male  Other Topics Concern   Not on file  Social History Narrative   Not on file   Social Determinants of Health   Financial Resource Strain: Low Risk    Difficulty of Paying Living Expenses: Not hard at all  Food Insecurity: Unknown   Worried About Charity fundraiser in the Last Year: Not on file   [employer] of Food in the Last Year: Never true  Transportation Needs: No Transportation Needs   Lack of Transportation (Medical): No   Lack of Transportation (Non-Medical): No  Physical Activity: Sufficiently Active   Days of Exercise per Week: 7 days   Minutes of Exercise per Session: 30  min  Stress: No Stress Concern Present   Feeling of Stress : Only a little  Social Connections: Moderately Isolated   Frequency of Communication with Friends and Family: More than three times a week   Frequency of Social Gatherings with Friends and Family: Twice a week   Attends Religious Services: Never   Marine scientist or Organizations: No   Attends Music therapist: Never   Marital Status: Married  Human resources officer Violence: Not At Risk   Fear of Current or Ex-Partner: No   Emotionally Abused: No   Physically Abused: No   Sexually Abused: No    Outpatient Medications Prior to Visit  Medication Sig Dispense Refill   acetaminophen (TYLENOL) 500 MG tablet Take 500 mg by mouth 2 (two) times daily as needed.     ALPRAZolam (XANAX) 1 MG tablet TAKE 1 TABLET BY MOUTH TWICE A DAY AS NEEDED FOR ANXIETY 180 tablet 1   Biotin 5000 MCG CAPS Take by mouth.     celecoxib (CELEBREX) 100 MG capsule Take 100 mg by mouth daily.     fish oil-omega-3 fatty acids 1000 MG capsule Take 1,000 mg by mouth daily. 2 caps     nitroGLYCERIN (NITROSTAT) 0.4 MG SL tablet Place 1 tablet (0.4 mg total) under the tongue every 5 (five) minutes as needed for chest pain. 25 tablet 1   rosuvastatin (CRESTOR) 40 MG tablet TAKE 1 TABLET BY MOUTH EVERYDAY AT BEDTIME 90 tablet 1   sertraline (ZOLOFT) 100 MG tablet TAKE 1 TABLET BY MOUTH EVERY DAY 90 tablet 1   Turmeric 500 MG CAPS Take 2 capsules by mouth daily.     zolpidem (AMBIEN) 10 MG tablet TAKE 1 TABLET (10 MG TOTAL) BY MOUTH AT BEDTIME AS NEEDED. FOR SLEEP 90 tablet 1   No facility-administered medications prior to visit.    Allergies  Allergen Reactions   Meloxicam Other (See Comments)    "HEADACHE, POOR APPETITE, AND ANXIOUS"    Review of Systems  Constitutional:  Positive for malaise/fatigue. Negative for fever.  HENT:  Negative for congestion.   Eyes:  Negative for blurred vision.  Respiratory:  Negative for shortness of breath.    Cardiovascular:  Negative for chest pain, palpitations and leg swelling.  Gastrointestinal:  Negative for abdominal pain, blood in stool and nausea.  Genitourinary:  Negative for dysuria and frequency.  Musculoskeletal:  Negative for falls.  Skin:  Negative for rash.  Neurological:  Negative for dizziness, loss of consciousness and headaches.  Endo/Heme/Allergies:  Negative for environmental allergies.  Psychiatric/Behavioral:  Positive for depression. The patient is nervous/anxious.       Objective:  Physical Exam Constitutional:      General: She is not in acute distress.    Appearance: Normal appearance. She is not ill-appearing or toxic-appearing.  HENT:     Head: Normocephalic and atraumatic.     Right Ear: External ear normal.     Left Ear: External ear normal.     Nose: Nose normal.  Eyes:     General:        Right eye: No discharge.        Left eye: No discharge.  Pulmonary:     Effort: Pulmonary effort is normal.  Skin:    Findings: No rash.  Neurological:     Mental Status: She is alert and oriented to person, place, and time.  Psychiatric:        Behavior: Behavior normal.    There were no vitals taken for this visit. Wt Readings from Last 3 Encounters:  09/02/21 161 lb 12.8 oz (73.4 kg)  07/20/21 160 lb (72.6 kg)  02/26/21 156 lb 9.6 oz (71 kg)    Diabetic Foot Exam - Simple   No data filed    Lab Results  Component Value Date   WBC 5.3 09/02/2021   HGB 13.7 09/02/2021   HCT 41.8 09/02/2021   PLT 232.0 09/02/2021   GLUCOSE 89 09/02/2021   CHOL 179 09/02/2021   TRIG 306.0 (H) 09/02/2021   HDL 50.80 09/02/2021   LDLDIRECT 96.0 09/02/2021   LDLCALC 138 (H) 07/24/2020   ALT 30 09/02/2021   AST 23 09/02/2021   NA 138 09/02/2021   K 4.2 09/02/2021   CL 101 09/02/2021   CREATININE 0.63 09/02/2021   BUN 21 09/02/2021   CO2 30 09/02/2021   TSH 3.56 09/02/2021   HGBA1C 6.2 02/20/2021   MICROALBUR <0.7 07/06/2015    Lab Results  Component  Value Date   TSH 3.56 09/02/2021   Lab Results  Component Value Date   WBC 5.3 09/02/2021   HGB 13.7 09/02/2021   HCT 41.8 09/02/2021   MCV 87.3 09/02/2021   PLT 232.0 09/02/2021   Lab Results  Component Value Date   NA 138 09/02/2021   K 4.2 09/02/2021   CO2 30 09/02/2021   GLUCOSE 89 09/02/2021   BUN 21 09/02/2021   CREATININE 0.63 09/02/2021   BILITOT 0.3 09/02/2021   ALKPHOS 62 09/02/2021   AST 23 09/02/2021   ALT 30 09/02/2021   PROT 7.4 09/02/2021   ALBUMIN 4.8 09/02/2021   CALCIUM 9.8 09/02/2021   GFR 89.34 09/02/2021   Lab Results  Component Value Date   CHOL 179 09/02/2021   Lab Results  Component Value Date   HDL 50.80 09/02/2021   Lab Results  Component Value Date   LDLCALC 138 (H) 07/24/2020   Lab Results  Component Value Date   TRIG 306.0 (H) 09/02/2021   Lab Results  Component Value Date   CHOLHDL 4 09/02/2021   Lab Results  Component Value Date   HGBA1C 6.2 02/20/2021       Assessment & Plan:   Problem List Items Addressed This Visit     Depression with anxiety    She feels her depression and anxiety are made worse by her inability to get much done. Will attempt a course of low dose Ritalin to see if it is helpful      Hyperglycemia - Primary    hgba1c acceptable, minimize simple carbs. Increase exercise as tolerated.       Relevant Orders  Hemoglobin A1c   Fatigue    Will attempt a trial of Ritalin 5 mg po bid, she is aware of the risks and chooses to proceed.       Low vitamin B12 level    Supplement and monitor      Hyperlipidemia    Encourage heart healthy diet such as MIND or DASH diet, increase exercise, avoid trans fats, simple carbohydrates and processed foods, consider a krill or fish or flaxseed oil cap daily.       Relevant Orders   CBC with Differential/Platelet   Comprehensive metabolic panel   Lipid panel   TSH    I am having Ludean H. Luca start on methylphenidate. I am also having her maintain her  fish oil-omega-3 fatty acids, Biotin, nitroGLYCERIN, acetaminophen, zolpidem, Turmeric, rosuvastatin, sertraline, ALPRAZolam, and celecoxib.  Meds ordered this encounter  Medications   methylphenidate (RITALIN) 5 MG tablet    Sig: Take 1 tablet (5 mg total) by mouth 2 (two) times daily with breakfast and lunch.    Dispense:  60 tablet    Refill:  0    I discussed the assessment and treatment plan with the patient. The patient was provided an opportunity to ask questions and all were answered. The patient agreed with the plan and demonstrated an understanding of the instructions.   The patient was advised to call back or seek an in-person evaluation if the symptoms worsen or if the condition fails to improve as anticipated.  I provided 22 minutes of face-to-face time during this encounter.   Penni Homans, MD [facility] at [facility] [phone] (phone) [phone] (fax)  [place]

## 2021-12-08 NOTE — Assessment & Plan Note (Signed)
hgba1c acceptable, minimize simple carbs. Increase exercise as tolerated.  

## 2021-12-08 NOTE — Assessment & Plan Note (Signed)
She feels her depression and anxiety are made worse by her inability to get much done. Will attempt a course of low dose Ritalin to see if it is helpful

## 2021-12-08 NOTE — Assessment & Plan Note (Signed)
>>  ASSESSMENT AND PLAN FOR MIXED DIABETIC HYPERLIPIDEMIA ASSOCIATED WITH TYPE 2 DIABETES MELLITUS (HCC) WRITTEN ON 12/08/2021 10:15 PM BY BLYTH, STACEY A, MD  hgba1c acceptable, minimize simple carbs. Increase exercise as tolerated.

## 2021-12-08 NOTE — Assessment & Plan Note (Signed)
Encourage heart healthy diet such as MIND or DASH diet, increase exercise, avoid trans fats, simple carbohydrates and processed foods, consider a krill or fish or flaxseed oil cap daily.  °

## 2021-12-08 NOTE — Assessment & Plan Note (Signed)
Supplement and monitor 

## 2021-12-09 NOTE — Telephone Encounter (Signed)
Pt aware.

## 2022-01-07 ENCOUNTER — Other Ambulatory Visit (INDEPENDENT_AMBULATORY_CARE_PROVIDER_SITE_OTHER): Payer: Medicare HMO

## 2022-01-07 ENCOUNTER — Ambulatory Visit (INDEPENDENT_AMBULATORY_CARE_PROVIDER_SITE_OTHER): Payer: Medicare HMO | Admitting: Family Medicine

## 2022-01-07 VITALS — BP 122/84 | HR 95

## 2022-01-07 DIAGNOSIS — R030 Elevated blood-pressure reading, without diagnosis of hypertension: Secondary | ICD-10-CM | POA: Diagnosis not present

## 2022-01-07 DIAGNOSIS — E785 Hyperlipidemia, unspecified: Secondary | ICD-10-CM

## 2022-01-07 DIAGNOSIS — R739 Hyperglycemia, unspecified: Secondary | ICD-10-CM | POA: Diagnosis not present

## 2022-01-07 LAB — COMPREHENSIVE METABOLIC PANEL
ALT: 28 U/L (ref 0–35)
AST: 26 U/L (ref 0–37)
Albumin: 4.8 g/dL (ref 3.5–5.2)
Alkaline Phosphatase: 63 U/L (ref 39–117)
BUN: 22 mg/dL (ref 6–23)
CO2: 33 mEq/L — ABNORMAL HIGH (ref 19–32)
Calcium: 9.9 mg/dL (ref 8.4–10.5)
Chloride: 99 mEq/L (ref 96–112)
Creatinine, Ser: 0.61 mg/dL (ref 0.40–1.20)
GFR: 89.81 mL/min (ref >60.00)
Glucose, Bld: 115 mg/dL — ABNORMAL HIGH (ref 70–99)
Potassium: 4.8 mEq/L (ref 3.5–5.1)
Sodium: 140 mEq/L (ref 135–145)
Total Bilirubin: 0.4 mg/dL (ref 0.2–1.2)
Total Protein: 7.1 g/dL (ref 6.0–8.3)

## 2022-01-07 LAB — CBC WITH DIFFERENTIAL/PLATELET
Basophils Absolute: 0 10*3/uL (ref 0.0–0.1)
Basophils Relative: 0.7 % (ref 0.0–3.0)
Eosinophils Absolute: 0.1 10*3/uL (ref 0.0–0.7)
Eosinophils Relative: 1.6 % (ref 0.0–5.0)
HCT: 41.3 % (ref 36.0–46.0)
Hemoglobin: 13.9 g/dL (ref 12.0–15.0)
Lymphocytes Relative: 34.4 % (ref 12.0–46.0)
Lymphs Abs: 2.2 10*3/uL (ref 0.7–4.0)
MCHC: 33.5 g/dL (ref 30.0–36.0)
MCV: 86.7 fl (ref 78.0–100.0)
Monocytes Absolute: 0.4 10*3/uL (ref 0.1–1.0)
Monocytes Relative: 5.7 % (ref 3.0–12.0)
Neutro Abs: 3.7 10*3/uL (ref 1.4–7.7)
Neutrophils Relative %: 57.6 % (ref 43.0–77.0)
Platelets: 227 10*3/uL (ref 150.0–400.0)
RBC: 4.77 Mil/uL (ref 3.87–5.11)
RDW: 14.3 % (ref 11.5–15.5)
WBC: 6.3 10*3/uL (ref 4.0–10.5)

## 2022-01-07 LAB — TSH: TSH: 2.53 u[IU]/mL (ref 0.35–5.50)

## 2022-01-07 LAB — LIPID PANEL
Cholesterol: 142 mg/dL (ref 0–200)
HDL: 48.2 mg/dL (ref >39.00)
LDL Cholesterol: 56 mg/dL (ref 0–99)
NonHDL: 93.41
Total CHOL/HDL Ratio: 3
Triglycerides: 188 mg/dL — ABNORMAL HIGH (ref 0.0–149.0)
VLDL: 37.6 mg/dL (ref 0.0–40.0)

## 2022-01-07 LAB — HEMOGLOBIN A1C: Hgb A1c MFr Bld: 6.3 % (ref 4.6–6.5)

## 2022-01-07 NOTE — Progress Notes (Signed)
Pt here for Blood pressure check per Mosie Lukes, MD ? ?Pt currently takes: No BP meds. Pt taking Ritalin '5MG'$  twice daily. Pt states she feels the ritalin is helping a little but feels like it wears off later in the day. Pt takes her second dose around lunch time. ? ? ?BP Readings from Last 3 Encounters:  ?09/02/21 112/64  ?02/26/21 116/78  ?11/20/20 132/78  ?  ? ?BP today: 124/80 L arm, after sitting for 5 minutes 122/84 ? ?HR: 95 ? ?

## 2022-01-13 ENCOUNTER — Other Ambulatory Visit: Payer: Self-pay | Admitting: Family Medicine

## 2022-01-13 MED ORDER — METHYLPHENIDATE HCL 5 MG PO TABS: 5.0000 mg | ORAL_TABLET | Freq: Two times a day (BID) | ORAL | 0 refills | Status: DC

## 2022-01-13 NOTE — Telephone Encounter (Signed)
Medication: methylphenidate (RITALIN) 5 MG tablet  Has the patient contacted their pharmacy? No.   Preferred Pharmacy:  [id number] - [place], [place] - [address] [place] [address] Phone: [phone]  Fax: [phone] 

## 2022-01-13 NOTE — Telephone Encounter (Signed)
Requesting: Ritalin '5MG'$   ?Contract: 01/28/18 ?UDS: 01/15/21 ?Last Visit: 01/07/22 ?Next Visit: 5/18/223 ?Last Refill: 12/05/21 ? ?Please Advise  ?

## 2022-01-23 ENCOUNTER — Other Ambulatory Visit: Payer: Self-pay | Admitting: Family Medicine

## 2022-02-06 DIAGNOSIS — Z85828 Personal history of other malignant neoplasm of skin: Secondary | ICD-10-CM | POA: Diagnosis not present

## 2022-02-06 DIAGNOSIS — L814 Other melanin hyperpigmentation: Secondary | ICD-10-CM | POA: Diagnosis not present

## 2022-02-06 DIAGNOSIS — D225 Melanocytic nevi of trunk: Secondary | ICD-10-CM | POA: Diagnosis not present

## 2022-02-06 DIAGNOSIS — L853 Xerosis cutis: Secondary | ICD-10-CM | POA: Diagnosis not present

## 2022-02-06 DIAGNOSIS — L905 Scar conditions and fibrosis of skin: Secondary | ICD-10-CM | POA: Diagnosis not present

## 2022-02-06 DIAGNOSIS — I781 Nevus, non-neoplastic: Secondary | ICD-10-CM | POA: Diagnosis not present

## 2022-02-06 DIAGNOSIS — L821 Other seborrheic keratosis: Secondary | ICD-10-CM | POA: Diagnosis not present

## 2022-02-12 ENCOUNTER — Telehealth: Payer: Self-pay | Admitting: Family Medicine

## 2022-02-12 MED ORDER — METHYLPHENIDATE HCL 5 MG PO TABS: 5.0000 mg | ORAL_TABLET | Freq: Two times a day (BID) | ORAL | 0 refills | Status: DC

## 2022-02-12 NOTE — Telephone Encounter (Signed)
Requesting: Ritalin '5mg'$  ?Contract: 01/28/18 ?UDS: 01/15/21 ?Last Visit: 01/07/22 ?Next Visit: 03/13/22 ?Last Refill: 01/13/22 ? ?Please Advise  ?

## 2022-02-12 NOTE — Telephone Encounter (Signed)
Last ritalin dispensed: 01/15/2022, okay to send a prescription ?

## 2022-02-12 NOTE — Telephone Encounter (Signed)
Medication: methylphenidate (RITALIN) 5 MG tablet  ? ?Has the patient contacted their pharmacy? No. ? ? ?Preferred Pharmacy (with phone number or street name):  ?[id number] [place] - [address] [address] ?Phone:  [phone] Fax:  [phone] ? ?Agent: Please be advised that RX refills may take up to 3 business days. We ask that you follow-up with your pharmacy.  ?

## 2022-02-15 ENCOUNTER — Other Ambulatory Visit: Payer: Self-pay | Admitting: Family Medicine

## 2022-03-13 ENCOUNTER — Encounter: Payer: Medicare HMO | Admitting: Family Medicine

## 2022-03-21 ENCOUNTER — Encounter: Payer: Self-pay | Admitting: Family

## 2022-03-21 ENCOUNTER — Ambulatory Visit (INDEPENDENT_AMBULATORY_CARE_PROVIDER_SITE_OTHER): Payer: Medicare HMO | Admitting: Family

## 2022-03-21 VITALS — BP 146/85 | HR 90 | Temp 98.7°F | Resp 16 | Ht 62.0 in | Wt 164.0 lb

## 2022-03-21 DIAGNOSIS — G4700 Insomnia, unspecified: Secondary | ICD-10-CM | POA: Diagnosis not present

## 2022-03-21 DIAGNOSIS — Z1211 Encounter for screening for malignant neoplasm of colon: Secondary | ICD-10-CM

## 2022-03-21 DIAGNOSIS — R41840 Attention and concentration deficit: Secondary | ICD-10-CM | POA: Diagnosis not present

## 2022-03-21 DIAGNOSIS — E785 Hyperlipidemia, unspecified: Secondary | ICD-10-CM | POA: Diagnosis not present

## 2022-03-21 DIAGNOSIS — Z0000 Encounter for general adult medical examination without abnormal findings: Secondary | ICD-10-CM

## 2022-03-21 DIAGNOSIS — F418 Other specified anxiety disorders: Secondary | ICD-10-CM

## 2022-03-21 DIAGNOSIS — R69 Illness, unspecified: Secondary | ICD-10-CM | POA: Diagnosis not present

## 2022-03-21 MED ORDER — METHYLPHENIDATE HCL 5 MG PO TABS: 7.5000 mg | ORAL_TABLET | Freq: Two times a day (BID) | ORAL | 0 refills | Status: DC

## 2022-03-21 MED ORDER — SERTRALINE HCL 100 MG PO TABS: 150.0000 mg | ORAL_TABLET | Freq: Every day | ORAL | 0 refills | Status: DC

## 2022-03-21 NOTE — Patient Instructions (Signed)
Please increase zoloft to 1.5 tabs once daily Increase Ritalin to '5mg'$  1.'5mg'$  twice daily.

## 2022-03-21 NOTE — Assessment & Plan Note (Signed)
Lab Results  Component Value Date   CHOL 142 01/07/2022   HDL 48.20 01/07/2022   LDLCALC 56 01/07/2022   LDLDIRECT 96.0 09/02/2021   TRIG 188.0 (H) 01/07/2022   CHOLHDL 3 01/07/2022   Stable on crestor. Continue same.

## 2022-03-21 NOTE — Assessment & Plan Note (Addendum)
Uncontrolled. Will increase sertraline to 1.5 tabs once daily. She continues xanax bid.

## 2022-03-21 NOTE — Assessment & Plan Note (Signed)
Mammogram up to date.  Encouraged her to get the bivalent covid booster. Refer for colonoscopy.

## 2022-03-21 NOTE — Assessment & Plan Note (Signed)
Only slight improvement in her focus with ritalin '5mg'$  bid. BP is stable. Reviewed PDMP aware- OK. Will increase to 7.'5mg'$  bid. Follow up in 1 month. Will need to update controlled substance contract at that visit.

## 2022-03-21 NOTE — Progress Notes (Signed)
Subjective:   By signing my name below, I, Tricia Fleming, attest that this documentation has been prepared under the direction and in the presence of Tricia Fleming, 03/21/2022    Patient ID: Tricia Fleming, female    DOB: 11-03-1950, 71 y.o.   MRN: [id number]  No chief complaint on file.   HPI Patient is in today for a comprehensive physical exam.  Dull Ache in Left Abdomen - She complains of of a dull ache in her left upper abdomen. She denies of any constipation.  Ritalin/Adderall XR - She is interested in increasing her intake of 5 Mg of Ritalin (twice a day) or possibly switching to Adderall XR.  Depression - She takes 100 Mg of Zoloft. She reports that she does not feel "up". She feels as though she cries easier. She does not feel like she has things to look forward. She reports that once she gets out of bed, she's productive and focused. She reports that at one point her mood was improved.  Cholesterol - She is taking 40 Mg of Crestor. Lab Results  Component Value Date   CHOL 142 01/07/2022   HDL 48.20 01/07/2022   LDLCALC 56 01/07/2022   LDLDIRECT 96.0 09/02/2021   TRIG 188.0 (H) 01/07/2022   CHOLHDL 3 01/07/2022  Insomnia - She takes 10 Mg of Ambien as needed. Xanax - She takes 1 Mg of Xanax twice a day. She states that the medication is improving her symptoms .   She denies having any fever, ear pain, new muscle pain, joint pain, new moles, congestion, sinus pain, sore throat, palpations, wheezing, n/v/d, constipation, blood in stool, dysuria, frequency, hematuria, headaches, depresssion or anxiety at this time.  Social History: She reports no recent surgeries. She denies of any changes to her family medical history. She states that her oldest brother, Tricia Fleming has urinary cancer. Her second oldest has COPD. Her third oldest brother had a valve replacement. Her oldest sister, Tricia Fleming, has memory issues and potential heart troubles. Her third oldest sister, Tricia Fleming, has  had both knee and hip replacements. She is also an alcoholic. She reports that one of her sons passed away from overdose.  Colonoscopy: Last completed on 06/11/2010. Dexa: Last completed on 03/15/2020 Mammogram: Last completed on11/29/2022 Immunizations: She is UTD on pneumonia, shingles, and tetanus vaccine. She reports that she has received one dose of Covid 19 vaccine  1 Covid vaccine  Diet: She reports that she could improve her diet.  Exercise: She does not regularly exercise.  Dental: She is UTD on dental exams. Vision: She is not UTD on vision exams.     Health Maintenance Due  Topic Date Due   COVID-19 Vaccine (2 - Pfizer risk series) 03/08/2020   COLONOSCOPY (Pts 45-66yr Insurance coverage will need to be confirmed)  06/11/2020    Past Medical History:  Diagnosis Date   Anxiety    Atypical chest pain 12/08/2016   Breast lesion 06/18/2010   Qualifier: Diagnosis of  By: BCharlett BlakeMD, Stacey     Depression    Depression with anxiety 06/27/2011   Hyperglycemia 07/27/2013   Hyperlipidemia    Osteopenia 01/15/2016   Preventative health care 06/27/2011   Preventative health care 06/27/2011   SCC (squamous cell carcinoma), leg 05/04/2014   SCC    Shoulder pain, bilateral 02/13/2013    Past Surgical History:  Procedure Laterality Date   BREAST BIOPSY Left    needle core biopsy, benign   CHOLECYSTECTOMY  TUBAL LIGATION      Family History  Problem Relation Age of Onset   Diabetes Mother    Hypertension Mother    Coronary artery disease Mother    Cancer Father        metastic prostate   Abdominal Wall Hernia Sister    Hypertension Sister    Alcohol abuse Sister    Memory loss Sister    CVA Sister    Heart disease Sister    Heart disease Brother    Bladder Cancer Brother    Prostate cancer Brother    Benign prostatic hyperplasia Brother    Lung disease Brother        in a smoker   AAA (abdominal aortic aneurysm) Brother    Valvular heart disease Brother    Alcohol  abuse Brother    Benign prostatic hyperplasia Brother    Alcohol abuse Brother    Esophageal varices Brother    Diabetes Maternal Grandmother    Coronary artery disease Maternal Grandmother    Diabetes Maternal Grandfather    Drug abuse Son        drug overdose   Cancer Maternal Aunt    Birth defects Paternal Aunt        breast    Social History   Socioeconomic History   Marital status: Married    Spouse name: Not on file   Number of children: 3   Years of education: Not on file   Highest education level: Not on file  Occupational History   Occupation: retired  Tobacco Use   Smoking status: Never   Smokeless tobacco: Never  Substance and Sexual Activity   Alcohol use: Yes    Alcohol/week: 0.0 standard drinks    Comment: a couple beers a week   Drug use: No   Sexual activity: Yes    Partners: Male  Other Topics Concern   Not on file  Social History Narrative   Married   2 cats, 1 dog   Babysits her grandson   Social Determinants of Health   Financial Resource Strain: Low Risk    Difficulty of Paying Living Expenses: Not hard at all  Food Insecurity: Unknown   Worried About Charity fundraiser in the Last Year: Not on file   [employer] of Food in the Last Year: Never true  Transportation Needs: No Transportation Needs   Lack of Transportation (Medical): No   Lack of Transportation (Non-Medical): No  Physical Activity: Sufficiently Active   Days of Exercise per Week: 7 days   Minutes of Exercise per Session: 30 min  Stress: No Stress Concern Present   Feeling of Stress : Only a little  Social Connections: Moderately Isolated   Frequency of Communication with Friends and Family: More than three times a week   Frequency of Social Gatherings with Friends and Family: Twice a week   Attends Religious Services: Never   Marine scientist or Organizations: No   Attends Music therapist: Never   Marital Status: Married  Human resources officer Violence: Not  At Risk   Fear of Current or Ex-Partner: No   Emotionally Abused: No   Physically Abused: No   Sexually Abused: No    Outpatient Medications Prior to Visit  Medication Sig Dispense Refill   acetaminophen (TYLENOL) 500 MG tablet Take 500 mg by mouth 2 (two) times daily as needed.     ALPRAZolam (XANAX) 1 MG tablet TAKE 1 TABLET BY MOUTH TWICE A  DAY AS NEEDED FOR ANXIETY 180 tablet 1   Biotin 5000 MCG CAPS Take by mouth.     celecoxib (CELEBREX) 100 MG capsule Take 100 mg by mouth daily.     fish oil-omega-3 fatty acids 1000 MG capsule Take 1,000 mg by mouth daily. 2 caps     nitroGLYCERIN (NITROSTAT) 0.4 MG SL tablet Place 1 tablet (0.4 mg total) under the tongue every 5 (five) minutes as needed for chest pain. 25 tablet 1   rosuvastatin (CRESTOR) 40 MG tablet TAKE 1 TABLET BY MOUTH EVERYDAY AT BEDTIME 90 tablet 1   Turmeric 500 MG CAPS Take 2 capsules by mouth daily.     zolpidem (AMBIEN) 10 MG tablet TAKE 1 TABLET (10 MG TOTAL) BY MOUTH AT BEDTIME AS NEEDED. FOR SLEEP 90 tablet 1   methylphenidate (RITALIN) 5 MG tablet Take 1 tablet (5 mg total) by mouth 2 (two) times daily with breakfast and lunch. 60 tablet 0   sertraline (ZOLOFT) 100 MG tablet TAKE 1 TABLET BY MOUTH EVERY DAY 90 tablet 1   No facility-administered medications prior to visit.    Allergies  Allergen Reactions   Meloxicam Other (See Comments)    "HEADACHE, POOR APPETITE, AND ANXIOUS"    Review of Systems  Constitutional:  Negative for fever.  HENT:  Negative for congestion, ear pain, sinus pain and sore throat.   Respiratory:  Negative for wheezing.   Cardiovascular:  Negative for palpitations.  Gastrointestinal:  Positive for abdominal pain (LUQ). Negative for blood in stool, constipation, diarrhea, nausea and vomiting.  Genitourinary:  Negative for dysuria, frequency and hematuria.  Musculoskeletal:  Negative for joint pain and myalgias.  Skin:        (-) [place]  Neurological:  Negative for headaches.   Psychiatric/Behavioral:  Negative for depression. The patient is not nervous/anxious.       Objective:    Physical Exam Constitutional:      General: She is not in acute distress.    Appearance: Normal appearance. She is not ill-appearing.  HENT:     Head: Normocephalic and atraumatic.     Right Ear: Tympanic membrane, ear canal and external ear normal.     Left Ear: Tympanic membrane, ear canal and external ear normal.     Mouth/Throat:     Pharynx: No oropharyngeal exudate.  Eyes:     Extraocular Movements: Extraocular movements intact.     Pupils: Pupils are equal, round, and reactive to light.  Neck:     Thyroid: No thyromegaly.  Cardiovascular:     Rate and Rhythm: Normal rate and regular rhythm.     Heart sounds: Normal heart sounds. No murmur heard.   No gallop.  Pulmonary:     Effort: Pulmonary effort is normal. No respiratory distress.     Breath sounds: Normal breath sounds. No wheezing or rales.  Abdominal:     General: Bowel sounds are normal. There is no distension.     Palpations: Abdomen is soft.     Tenderness: There is no abdominal tenderness. There is no guarding.  Musculoskeletal:     Comments: 5/5 strength in both upper and lower extremities  Lymphadenopathy:     Cervical: No cervical adenopathy.  Skin:    General: Skin is warm and dry.  Neurological:     Mental Status: She is alert and oriented to person, place, and time.     Deep Tendon Reflexes:     Reflex Scores:  Patellar reflexes are 2+ on the right side and 2+ on the left side. Psychiatric:        Mood and Affect: Mood normal.        Behavior: Behavior normal.        Judgment: Judgment normal.    BP (!) 146/85 (BP Location: Right Arm, Patient Position: Sitting, Cuff Size: Small)   Pulse 90   Temp 98.7 F (37.1 C) (Oral)   Resp 16   Ht '5\' 2"'$  (1.575 m)   Wt 164 lb (74.4 kg)   SpO2 99%   BMI 30.00 kg/m  Wt Readings from Last 3 Encounters:  03/21/22 164 lb (74.4 kg)  09/02/21  161 lb 12.8 oz (73.4 kg)  07/20/21 160 lb (72.6 kg)       Assessment & Plan:   Problem List Items Addressed This Visit       Unprioritized   Preventative health care    Mammogram up to date.  Encouraged her to get the bivalent covid booster. Refer for colonoscopy.         Insomnia    Stable with prn use of ambien.        Hyperlipidemia    Lab Results  Component Value Date   CHOL 142 01/07/2022   HDL 48.20 01/07/2022   LDLCALC 56 01/07/2022   LDLDIRECT 96.0 09/02/2021   TRIG 188.0 (H) 01/07/2022   CHOLHDL 3 01/07/2022  Stable on crestor. Continue same.       Depression with anxiety    Uncontrolled. Will increase sertraline to 1.5 tabs once daily. She continues xanax bid.        Relevant Medications   sertraline (ZOLOFT) 100 MG tablet   Attention deficit    Only slight improvement in her focus with ritalin '5mg'$  bid. BP is stable. Reviewed PDMP aware- OK. Will increase to 7.'5mg'$  bid. Follow up in 1 month. Will need to update controlled substance contract at that visit.        Other Visit Diagnoses     Colon cancer screening    -  Primary   Relevant Orders   Ambulatory referral to Gastroenterology       Meds ordered this encounter  Medications   sertraline (ZOLOFT) 100 MG tablet    Sig: Take 1.5 tablets (150 mg total) by mouth daily.    Dispense:  135 tablet    Refill:  0    Order Specific Question:   Supervising Provider    Answer:   Penni Homans A [[id number]]   methylphenidate (RITALIN) 5 MG tablet    Sig: Take 1.5 tablets (7.5 mg total) by mouth 2 (two) times daily with breakfast and lunch.    Dispense:  90 tablet    Refill:  0    Order Specific Question:   Supervising Provider    Answer:   Penni Homans A [[id number]]    I, Nance Pear, Fleming, personally preformed the services described in this documentation.  All medical record entries made by the scribe were at my direction and in my presence.  I have reviewed the chart and discharge instructions (if  applicable) and agree that the record reflects my personal performance and is accurate and complete. 03/21/2022   I,Amber Collins,acting as a scribe for Nance Pear, Fleming.,have documented all relevant documentation on the behalf of Nance Pear, Fleming,as directed by  Nance Pear, Fleming while in the presence of Nance Pear, Fleming.    Nance Pear, Fleming

## 2022-03-21 NOTE — Assessment & Plan Note (Signed)
Stable with prn use of ambien.

## 2022-04-28 ENCOUNTER — Other Ambulatory Visit: Payer: Self-pay | Admitting: Family

## 2022-04-28 ENCOUNTER — Other Ambulatory Visit: Payer: Self-pay | Admitting: Family Medicine

## 2022-04-28 DIAGNOSIS — E782 Mixed hyperlipidemia: Secondary | ICD-10-CM

## 2022-04-28 DIAGNOSIS — R739 Hyperglycemia, unspecified: Secondary | ICD-10-CM

## 2022-04-28 DIAGNOSIS — R030 Elevated blood-pressure reading, without diagnosis of hypertension: Secondary | ICD-10-CM

## 2022-04-28 DIAGNOSIS — M8580 Other specified disorders of bone density and structure, unspecified site: Secondary | ICD-10-CM

## 2022-04-28 DIAGNOSIS — Z23 Encounter for immunization: Secondary | ICD-10-CM

## 2022-04-28 NOTE — Telephone Encounter (Signed)
Requesting: xanax 1 MG Contract: 4/419 UDS: 01/15/21 Last Visit: 03/21/22 Next Visit: 07/22/22 Last Refill: 10/29/21  Please Advise

## 2022-05-15 ENCOUNTER — Telehealth: Payer: Self-pay | Admitting: Family Medicine

## 2022-05-15 NOTE — Telephone Encounter (Signed)
Patient called to request a prescription for Adderall  per her visit with Melissa on 5/26. Patient would like the highest does that is safe for her    [id number] [place] [address] [id number]

## 2022-05-16 NOTE — Telephone Encounter (Signed)
Patient was supposed to have a 1 month follow up at end of June.  Lvm for patient to call and schedule her follow up to evaluate her elevated BP and ADD in order to change medication.  She will need a new drug contract if medication change.

## 2022-05-19 NOTE — Telephone Encounter (Signed)
Lvm for patient to call for appointment

## 2022-05-30 ENCOUNTER — Telehealth: Payer: Self-pay | Admitting: Family Medicine

## 2022-05-30 NOTE — Telephone Encounter (Signed)
Requesting: Ritalin Contract: N/A UDS:N/A Last Visit:03/21/22 Next Visit:07/22/22 Last Refill:03/21/22  Please Advise

## 2022-05-30 NOTE — Telephone Encounter (Signed)
Medication: methylphenidate (RITALIN) 5 MG tablet  Has the patient contacted their pharmacy? No.   Preferred Pharmacy:  [id number] [place], [place] - [address] [place] [address] Phone: [phone]  Fax: [phone] 

## 2022-06-01 ENCOUNTER — Other Ambulatory Visit: Payer: Self-pay | Admitting: Family Medicine

## 2022-06-01 MED ORDER — METHYLPHENIDATE HCL 5 MG PO TABS: 7.5000 mg | ORAL_TABLET | Freq: Two times a day (BID) | ORAL | 0 refills | Status: DC

## 2022-07-03 ENCOUNTER — Other Ambulatory Visit: Payer: Self-pay | Admitting: Family Medicine

## 2022-07-14 ENCOUNTER — Other Ambulatory Visit: Payer: Self-pay | Admitting: Family Medicine

## 2022-07-14 ENCOUNTER — Telehealth: Payer: Self-pay | Admitting: Family Medicine

## 2022-07-14 MED ORDER — METHYLPHENIDATE HCL 5 MG PO TABS: 7.5000 mg | ORAL_TABLET | Freq: Two times a day (BID) | ORAL | 0 refills | Status: DC

## 2022-07-14 NOTE — Telephone Encounter (Signed)
Medication: methylphenidate (RITALIN) 5 MG tablet [[id number]]   Has the patient contacted their pharmacy? No.   Preferred Pharmacy (with phone number or street name): [id number] [place], [place] [place] [address] Phone:  [phone] Fax:  [phone]   Agent: Please be advised that RX refills may take up to 3 business days. We ask that you follow-up with your pharmacy.

## 2022-07-22 ENCOUNTER — Ambulatory Visit (INDEPENDENT_AMBULATORY_CARE_PROVIDER_SITE_OTHER): Payer: Medicare HMO | Admitting: *Deleted

## 2022-07-22 VITALS — BP 130/79 | HR 88 | Ht 62.0 in | Wt 167.2 lb

## 2022-07-22 DIAGNOSIS — Z0000 Encounter for general adult medical examination without abnormal findings: Secondary | ICD-10-CM

## 2022-07-22 DIAGNOSIS — Z23 Encounter for immunization: Secondary | ICD-10-CM

## 2022-07-22 NOTE — Patient Instructions (Signed)
Tricia Fleming , Thank you for taking time to come for your Medicare Wellness Visit. I appreciate your ongoing commitment to your health goals. Please review the following plan we discussed and let me know if I can assist you in the future.   These are the goals we discussed:  Goals      Exercise 3x per week (30 min per time)        This is a list of the screening recommended for you and due dates:  Health Maintenance  Topic Date Due   COVID-19 Vaccine (2 - Pfizer risk series) 03/08/2020   Colon Cancer Screening  06/11/2020   Flu Shot  05/27/2022   Mammogram  09/25/2023   Tetanus Vaccine  05/29/2030   Pneumonia Vaccine  Completed   DEXA scan (bone density measurement)  Completed   Hepatitis C Screening: USPSTF Recommendation to screen - Ages 40-79 yo.  Completed   Zoster (Shingles) Vaccine  Completed   HPV Vaccine  Aged Out      Next appointment: Follow up in one year for your annual wellness visit    Preventive Care 65 Years and Older, Female Preventive care refers to lifestyle choices and visits with your health care provider that can promote health and wellness. What does preventive care include? A yearly physical exam. This is also called an annual well check. Dental exams once or twice a year. Routine eye exams. Ask your health care provider how often you should have your eyes checked. Personal lifestyle choices, including: Daily care of your teeth and gums. Regular physical activity. Eating a healthy diet. Avoiding tobacco and drug use. Limiting alcohol use. Practicing safe sex. Taking low-dose aspirin every day. Taking vitamin and mineral supplements as recommended by your health care provider. What happens during an annual well check? The services and screenings done by your health care provider during your annual well check will depend on your age, overall health, lifestyle risk factors, and family history of disease. Counseling  Your health care provider may ask  you questions about your: Alcohol use. Tobacco use. Drug use. Emotional well-being. Home and relationship well-being. Sexual activity. Eating habits. History of falls. Memory and ability to understand (cognition). Work and work Statistician. Reproductive health. Screening  You may have the following tests or measurements: Height, weight, and BMI. Blood pressure. Lipid and cholesterol levels. These may be checked every 5 years, or more frequently if you are over 47 years old. Skin check. Lung cancer screening. You may have this screening every year starting at age 52 if you have a 30-pack-year history of smoking and currently smoke or have quit within the past 15 years. Fecal occult blood test (FOBT) of the stool. You may have this test every year starting at age 4. Flexible sigmoidoscopy or colonoscopy. You may have a sigmoidoscopy every 5 years or a colonoscopy every 10 years starting at age 57. Hepatitis C blood test. Hepatitis B blood test. Sexually transmitted disease (STD) testing. Diabetes screening. This is done by checking your blood sugar (glucose) after you have not eaten for a while (fasting). You may have this done every 1-3 years. Bone density scan. This is done to screen for osteoporosis. You may have this done starting at age 33. Mammogram. This may be done every 1-2 years. Talk to your health care provider about how often you should have regular mammograms. Talk with your health care provider about your test results, treatment options, and if necessary, the need for more tests. Vaccines  Your health care provider may recommend certain vaccines, such as: Influenza vaccine. This is recommended every year. Tetanus, diphtheria, and acellular pertussis (Tdap, Td) vaccine. You may need a Td booster every 10 years. Zoster vaccine. You may need this after age 39. Pneumococcal 13-valent conjugate (PCV13) vaccine. One dose is recommended after age 25. Pneumococcal  polysaccharide (PPSV23) vaccine. One dose is recommended after age 42. Talk to your health care provider about which screenings and vaccines you need and how often you need them. This information is not intended to replace advice given to you by your health care provider. Make sure you discuss any questions you have with your health care provider. Document Released: 11/09/2015 Document Revised: 07/02/2016 Document Reviewed: 08/14/2015 Elsevier Interactive Patient Education  2017 [place] Prevention in the Home Falls can cause injuries. They can happen to people of all ages. There are many things you can do to make your home safe and to help prevent falls. What can I do on the outside of my home? Regularly fix the edges of walkways and driveways and fix any cracks. Remove anything that might make you trip as you walk through a door, such as a raised step or threshold. Trim any bushes or trees on the path to your home. Use bright outdoor lighting. Clear any walking paths of anything that might make someone trip, such as rocks or tools. Regularly check to see if handrails are loose or broken. Make sure that both sides of any steps have handrails. Any raised decks and porches should have guardrails on the edges. Have any leaves, snow, or ice cleared regularly. Use sand or salt on walking paths during winter. Clean up any spills in your garage right away. This includes oil or grease spills. What can I do in the bathroom? Use night lights. Install grab bars by the toilet and in the tub and shower. Do not use towel bars as grab bars. Use non-skid mats or decals in the tub or shower. If you need to sit down in the shower, use a plastic, non-slip stool. Keep the floor dry. Clean up any water that spills on the floor as soon as it happens. Remove soap buildup in the tub or shower regularly. Attach bath mats securely with double-sided non-slip rug tape. Do not have throw rugs and other  things on the floor that can make you trip. What can I do in the bedroom? Use night lights. Make sure that you have a light by your bed that is easy to reach. Do not use any sheets or blankets that are too big for your bed. They should not hang down onto the floor. Have a firm chair that has side arms. You can use this for support while you get dressed. Do not have throw rugs and other things on the floor that can make you trip. What can I do in the kitchen? Clean up any spills right away. Avoid walking on wet floors. Keep items that you use a lot in easy-to-reach places. If you need to reach something above you, use a strong step stool that has a grab bar. Keep electrical cords out of the way. Do not use floor polish or wax that makes floors slippery. If you must use wax, use non-skid floor wax. Do not have throw rugs and other things on the floor that can make you trip. What can I do with my stairs? Do not leave any items on the stairs. Make sure that there are  handrails on both sides of the stairs and use them. Fix handrails that are broken or loose. Make sure that handrails are as long as the stairways. Check any carpeting to make sure that it is firmly attached to the stairs. Fix any carpet that is loose or worn. Avoid having throw rugs at the top or bottom of the stairs. If you do have throw rugs, attach them to the floor with carpet tape. Make sure that you have a light switch at the top of the stairs and the bottom of the stairs. If you do not have them, ask someone to add them for you. What else can I do to help prevent falls? Wear shoes that: Do not have high heels. Have rubber bottoms. Are comfortable and fit you well. Are closed at the toe. Do not wear sandals. If you use a stepladder: Make sure that it is fully opened. Do not climb a closed stepladder. Make sure that both sides of the stepladder are locked into place. Ask someone to hold it for you, if possible. Clearly  mark and make sure that you can see: Any grab bars or handrails. First and last steps. Where the edge of each step is. Use tools that help you move around (mobility aids) if they are needed. These include: Canes. Walkers. Scooters. Crutches. Turn on the lights when you go into a dark area. Replace any light bulbs as soon as they burn out. Set up your furniture so you have a clear path. Avoid moving your furniture around. If any of your floors are uneven, fix them. If there are any pets around you, be aware of where they are. Review your medicines with your doctor. Some medicines can make you feel dizzy. This can increase your chance of falling. Ask your doctor what other things that you can do to help prevent falls. This information is not intended to replace advice given to you by your health care provider. Make sure you discuss any questions you have with your health care provider. Document Released: 08/09/2009 Document Revised: 03/20/2016 Document Reviewed: 11/17/2014 Elsevier Interactive Patient Education  2017 [employer].

## 2022-07-22 NOTE — Progress Notes (Signed)
Subjective:   Tricia Fleming is a 71 y.o. female who presents for Medicare Annual (Subsequent) preventive examination.  Review of Systems    Defer to PCP Cardiac Risk Factors include: advanced age (>30mn, >>25women);dyslipidemia;sedentary lifestyle     Objective:    Today's Vitals   07/22/22 0818  BP: 130/79  Pulse: 88  Weight: 167 lb 3.2 oz (75.8 kg)  Height: '5\' 2"'$  (1.575 m)   Body mass index is 30.58 kg/m.     07/22/2022    8:26 AM 07/20/2021    8:53 AM 11/27/2014   11:56 AM  Advanced Directives  Does Patient Have a Medical Advance Directive? Yes Yes No  Type of Advance Directive Out of facility DNR (pink MOST or yellow form) [place]   Does patient want to make changes to medical advance directive? No - Patient declined  Yes - information given  Copy of [place]?  No - copy requested No - copy requested  Would patient like information on creating a medical advance directive?   Yes - Educational materials given    Current Medications (verified) Outpatient Encounter Medications as of 07/22/2022  Medication Sig   acetaminophen (TYLENOL) 500 MG tablet Take 500 mg by mouth 2 (two) times daily as needed.   ALPRAZolam (XANAX) 1 MG tablet TAKE 1 TABLET BY MOUTH TWICE A DAY AS NEEDED FOR ANXIETY   Biotin 5000 MCG CAPS Take by mouth.   celecoxib (CELEBREX) 100 MG capsule Take 100 mg by mouth daily.   fish oil-omega-3 fatty acids 1000 MG capsule Take 1,000 mg by mouth daily. 2 caps   methylphenidate (RITALIN) 5 MG tablet Take 1.5 tablets (7.5 mg total) by mouth 2 (two) times daily with breakfast and lunch.   nitroGLYCERIN (NITROSTAT) 0.4 MG SL tablet Place 1 tablet (0.4 mg total) under the tongue every 5 (five) minutes as needed for chest pain.   rosuvastatin (CRESTOR) 40 MG tablet TAKE 1 TABLET BY MOUTH EVERYDAY AT BEDTIME   sertraline (ZOLOFT) 100 MG tablet TAKE 1.5 TABLETS (150 MG TOTAL) BY MOUTH DAILY   Turmeric  500 MG CAPS Take 2 capsules by mouth daily.   zolpidem (AMBIEN) 10 MG tablet TAKE 1 TABLET (10 MG TOTAL) BY MOUTH AT BEDTIME AS NEEDED. FOR SLEEP   No facility-administered encounter medications on file as of 07/22/2022.    Allergies (verified) Meloxicam   History: Past Medical History:  Diagnosis Date   Anxiety    Atypical chest pain 12/08/2016   Breast lesion 06/18/2010   Qualifier: Diagnosis of  By: BCharlett BlakeMD, Stacey     Depression    Depression with anxiety 06/27/2011   Hyperglycemia 07/27/2013   Hyperlipidemia    Osteopenia 01/15/2016   Preventative health care 06/27/2011   Preventative health care 06/27/2011   SCC (squamous cell carcinoma), leg 05/04/2014   SCC    Shoulder pain, bilateral 02/13/2013   Past Surgical History:  Procedure Laterality Date   BREAST BIOPSY Left    needle core biopsy, benign   CHOLECYSTECTOMY     TUBAL LIGATION     Family History  Problem Relation Age of Onset   Diabetes Mother    Hypertension Mother    Coronary artery disease Mother    Cancer Father        metastic prostate   Abdominal Wall Hernia Sister    Hypertension Sister    Alcohol abuse Sister    Memory loss Sister  CVA Sister    Heart disease Sister    Heart disease Brother    Bladder Cancer Brother    Prostate cancer Brother    Benign prostatic hyperplasia Brother    Lung disease Brother        in a smoker   AAA (abdominal aortic aneurysm) Brother    Valvular heart disease Brother    Alcohol abuse Brother    Benign prostatic hyperplasia Brother    Alcohol abuse Brother    Esophageal varices Brother    Diabetes Maternal Grandmother    Coronary artery disease Maternal Grandmother    Diabetes Maternal Grandfather    Drug abuse Son        drug overdose   Cancer Maternal Aunt    Birth defects Paternal Aunt        breast   Social History   Socioeconomic History   Marital status: Married    Spouse name: Not on file   Number of children: 3   Years of education: Not on  file   Highest education level: Not on file  Occupational History   Occupation: retired  Tobacco Use   Smoking status: Never   Smokeless tobacco: Never  Substance and Sexual Activity   Alcohol use: Yes    Alcohol/week: 0.0 standard drinks of alcohol    Comment: a couple beers a week   Drug use: No   Sexual activity: Yes    Partners: Male  Other Topics Concern   Not on file  Social History Narrative   Married   2 cats, 1 dog   Babysits her grandson   Social Determinants of Health   Financial Resource Strain: Low Risk  (07/20/2021)   Overall Financial Resource Strain (CARDIA)    Difficulty of Paying Living Expenses: Not hard at all  Food Insecurity: No Food Insecurity (07/22/2022)   Hunger Vital Sign    Worried About Running Out of Food in the Last Year: Never true    [place] in the Last Year: Never true  Transportation Needs: No Transportation Needs (07/20/2021)   PRAPARE - Hydrologist (Medical): No    Lack of Transportation (Non-Medical): No  Physical Activity: Sufficiently Active (07/20/2021)   Exercise Vital Sign    Days of Exercise per Week: 7 days    Minutes of Exercise per Session: 30 min  Stress: No Stress Concern Present (07/20/2021)   [place]    Feeling of Stress : Only a little  Social Connections: Moderately Isolated (07/20/2021)   Social Connection and Isolation Panel [NHANES]    Frequency of Communication with Friends and Family: More than three times a week    Frequency of Social Gatherings with Friends and Family: Twice a week    Attends Religious Services: Never    Marine scientist or Organizations: No    Attends Music therapist: Never    Marital Status: Married    Tobacco Counseling Counseling given: Not Answered   Clinical Intake:  Pre-visit preparation completed: Yes  Pain : No/denies pain     Diabetes: No  How often  do you need to have someone help you when you read instructions, pamphlets, or other written materials from your doctor or pharmacy?: 1 - Never  Diabetic? No  Activities of Daily Living    07/22/2022    8:32 AM  In your present state of health, do you have any  difficulty performing the following activities:  Hearing? 0  Vision? 0  Difficulty concentrating or making decisions? 1  Walking or climbing stairs? 0  Dressing or bathing? 0  Doing errands, shopping? 0  Preparing Food and eating ? N  Using the Toilet? N  In the past six months, have you accidently leaked urine? Y  Do you have problems with loss of bowel control? N  Managing your Medications? N  Managing your Finances? N  Housekeeping or managing your Housekeeping? N    Patient Care Team: Mosie Lukes, MD as PCP - General Shelly Rubenstein, NP as Nurse Practitioner (Dermatology)  Indicate any recent Medical Services you may have received from other than [facility] providers in the past year (date may be approximate).     Assessment:   This is a routine wellness examination for Boyce.  Hearing/Vision screen No results found.  Dietary issues and exercise activities discussed: Current Exercise Habits: The patient does not participate in regular exercise at present, Exercise limited by: None identified   Goals Addressed   None    Depression Screen    07/22/2022    8:28 AM 03/21/2022    9:45 AM 07/20/2021    8:48 AM 02/26/2021    8:51 AM 11/20/2020    9:55 AM 06/06/2020    3:36 PM 01/28/2018    2:12 PM  PHQ 2/9 Scores  PHQ - 2 Score '2 2 4 4 2 6 '$ 0  PHQ- 9 Score  '2 4 4 5 10     '$ Fall Risk    07/22/2022    8:26 AM 03/21/2022    9:46 AM 07/20/2021    8:53 AM 11/20/2020    9:54 AM 07/24/2020    9:32 AM  Fall Risk   Falls in the past year? 0 0 0 0   Number falls in past yr: 0 0 0 0 0  Injury with Fall? 0 0 0 0 0  Risk for fall due to : No Fall Risks  No Fall Risks    Follow up Falls evaluation completed  Falls prevention  discussed      FALL RISK PREVENTION PERTAINING TO THE HOME:  Any stairs in or around the home? Yes  If so, are there any without handrails? No  Home free of loose throw rugs in walkways, pet beds, electrical cords, etc? Yes  Adequate lighting in your home to reduce risk of falls? Yes   ASSISTIVE DEVICES UTILIZED TO PREVENT FALLS:  Life alert? No  Use of a cane, walker or w/c? No  Grab bars in the bathroom? No  Shower chair or bench in shower? No  Elevated toilet seat or a handicapped toilet? Yes   TIMED UP AND GO:  Was the test performed? Yes .  Length of time to ambulate 10 feet: 5 sec.   Gait steady and fast without use of assistive device  Cognitive Function:        07/22/2022    8:37 AM 07/20/2021    8:53 AM  6CIT Screen  What Year? 0 points 0 points  What month? 0 points 0 points  What time? 0 points 0 points  Count back from 20 0 points 0 points  Months in reverse 0 points 0 points  Repeat phrase 0 points 2 points  Total Score 0 points 2 points    Immunizations Immunization History  Administered Date(s) Administered   Fluad Quad(high Dose 65+) 07/24/2020, 09/02/2021, 07/22/2022   Influenza Split 08/26/2011   Influenza,  High Dose Seasonal PF 08/05/2018, 11/09/2019   Influenza,inj,Quad PF,6+ Mos 07/27/2013, 07/24/2014, 07/06/2015   Influenza-Unspecified 08/25/2018   PFIZER(Purple Top)SARS-COV-2 Vaccination 02/16/2020   Pneumococcal Conjugate-13 07/27/2013   Pneumococcal Polysaccharide-23 07/06/2015   Td 06/18/2010   Tdap 05/29/2020   Zoster Recombinat (Shingrix) 08/25/2018, 10/25/2018   Zoster, Live 01/15/2016    TDAP status: Up to date  Flu Vaccine status: Completed at today's visit  Pneumococcal vaccine status: Up to date  Covid-19 vaccine status: Information provided on how to obtain vaccines.   Qualifies for Shingles Vaccine? Yes   Zostavax completed Yes   Shingrix Completed?: Yes  Screening Tests Health Maintenance  Topic Date Due    COVID-19 Vaccine (2 - Pfizer risk series) 03/08/2020   COLONOSCOPY (Pts 45-60yr Insurance coverage will need to be confirmed)  06/11/2020   MAMMOGRAM  09/25/2023   TETANUS/TDAP  05/29/2030   Pneumonia Vaccine 72 Years old  Completed   INFLUENZA VACCINE  Completed   DEXA SCAN  Completed   Hepatitis C Screening  Completed   Zoster Vaccines- Shingrix  Completed   HPV VACCINES  Aged Out    Health Maintenance  Health Maintenance Due  Topic Date Due   COVID-19 Vaccine (2 - Pfizer risk series) 03/08/2020   COLONOSCOPY (Pts 45-458yrInsurance coverage will need to be confirmed)  06/11/2020    Colorectal cancer screening: Type of screening: Colonoscopy. Completed 06/11/10. Repeat every 10 years  Mammogram status: Completed 09/24/21. Repeat every year  Bone Density status: Completed 03/15/20. Results reflect: Bone density results: OSTEOPENIA. Repeat every 2 years.  Lung Cancer Screening: (Low Dose CT Chest recommended if Age 72-80ears, 30 pack-year currently smoking OR have quit w/in 15years.) does not qualify.   Lung Cancer Screening Referral: N/a  Additional Screening:  Hepatitis C Screening: does qualify; Completed N/a  Vision Screening: Recommended annual ophthalmology exams for early detection of glaucoma and other disorders of the eye. Is the patient up to date with their annual eye exam?  No  Who is the provider or what is the name of the office in which the patient attends annual eye exams? [facility] If pt is not established with a provider, would they like to be referred to a provider to establish care? No .   Dental Screening: Recommended annual dental exams for proper oral hygiene  Community Resource Referral / Chronic Care Management: CRR required this visit?  No   CCM required this visit?  No      Plan:     I have personally reviewed and noted the following in the patient's chart:   Medical and social history Use of alcohol, tobacco or illicit drugs   Current medications and supplements including opioid prescriptions. Patient is not currently taking opioid prescriptions. Functional ability and status Nutritional status Physical activity Advanced directives List of other physicians Hospitalizations, surgeries, and ER visits in previous 12 months Vitals Screenings to include cognitive, depression, and falls Referrals and appointments  In addition, I have reviewed and discussed with patient certain preventive protocols, quality metrics, and best practice recommendations. A written personalized care plan for preventive services as well as general preventive health recommendations were provided to patient.     [place] 07/22/2022   Nurse Notes: None

## 2022-08-21 ENCOUNTER — Other Ambulatory Visit: Payer: Self-pay | Admitting: Family Medicine

## 2022-08-21 ENCOUNTER — Telehealth: Payer: Self-pay | Admitting: Family Medicine

## 2022-08-21 MED ORDER — METHYLPHENIDATE HCL 5 MG PO TABS: 7.5000 mg | ORAL_TABLET | Freq: Two times a day (BID) | ORAL | 0 refills | Status: DC

## 2022-08-21 NOTE — Telephone Encounter (Signed)
Medication: methylphenidate (RITALIN) 5 MG tablet  Has the patient contacted their pharmacy? No.   Preferred Pharmacy:  [id number] [place] [address] [place] [address]Phone: [phone] Fax: [phone]

## 2022-08-26 DIAGNOSIS — H5203 Hypermetropia, bilateral: Secondary | ICD-10-CM | POA: Diagnosis not present

## 2022-09-08 NOTE — Assessment & Plan Note (Signed)
Supplement and monitor 

## 2022-09-08 NOTE — Assessment & Plan Note (Signed)
>>  ASSESSMENT AND PLAN FOR MIXED DIABETIC HYPERLIPIDEMIA ASSOCIATED WITH TYPE 2 DIABETES MELLITUS (HCC) WRITTEN ON 09/08/2022  8:42 PM BY BLYTH, STACEY A, MD  hgba1c acceptable, minimize simple carbs. Increase exercise as tolerated. Continue current meds

## 2022-09-08 NOTE — Assessment & Plan Note (Signed)
Encouraged good sleep hygiene such as dark, quiet room. No blue/green glowing lights such as computer screens in bedroom. No alcohol or stimulants in evening. Cut down on caffeine as able. Regular exercise is helpful but not just prior to bed time.  Tolerating Ambien

## 2022-09-08 NOTE — Assessment & Plan Note (Signed)
hgba1c acceptable, minimize simple carbs. Increase exercise as tolerated. Continue current meds 

## 2022-09-08 NOTE — Assessment & Plan Note (Signed)
Encourage heart healthy diet such as MIND or DASH diet, increase exercise, avoid trans fats, simple carbohydrates and processed foods, consider a krill or fish or flaxseed oil cap daily.  Tolerating Rosuvastatin 

## 2022-09-08 NOTE — Assessment & Plan Note (Signed)
Stable on Sertraline and Alprazolam prn

## 2022-09-09 ENCOUNTER — Ambulatory Visit (INDEPENDENT_AMBULATORY_CARE_PROVIDER_SITE_OTHER): Payer: Medicare HMO | Admitting: Family Medicine

## 2022-09-09 VITALS — BP 128/76 | HR 101 | Temp 98.0°F | Resp 16 | Ht 62.0 in | Wt 165.8 lb

## 2022-09-09 DIAGNOSIS — E785 Hyperlipidemia, unspecified: Secondary | ICD-10-CM

## 2022-09-09 DIAGNOSIS — R6889 Other general symptoms and signs: Secondary | ICD-10-CM | POA: Diagnosis not present

## 2022-09-09 DIAGNOSIS — K649 Unspecified hemorrhoids: Secondary | ICD-10-CM | POA: Diagnosis not present

## 2022-09-09 DIAGNOSIS — R7989 Other specified abnormal findings of blood chemistry: Secondary | ICD-10-CM | POA: Diagnosis not present

## 2022-09-09 DIAGNOSIS — R350 Frequency of micturition: Secondary | ICD-10-CM

## 2022-09-09 DIAGNOSIS — R739 Hyperglycemia, unspecified: Secondary | ICD-10-CM | POA: Diagnosis not present

## 2022-09-09 DIAGNOSIS — R41840 Attention and concentration deficit: Secondary | ICD-10-CM

## 2022-09-09 DIAGNOSIS — R69 Illness, unspecified: Secondary | ICD-10-CM | POA: Diagnosis not present

## 2022-09-09 DIAGNOSIS — F418 Other specified anxiety disorders: Secondary | ICD-10-CM

## 2022-09-09 DIAGNOSIS — G4700 Insomnia, unspecified: Secondary | ICD-10-CM | POA: Diagnosis not present

## 2022-09-09 LAB — LIPID PANEL
Cholesterol: 135 mg/dL (ref 0–200)
HDL: 44.7 mg/dL (ref >39.00)
LDL Cholesterol: 55 mg/dL (ref 0–99)
NonHDL: 90.56
Total CHOL/HDL Ratio: 3
Triglycerides: 180 mg/dL — ABNORMAL HIGH (ref 0.0–149.0)
VLDL: 36 mg/dL (ref 0.0–40.0)

## 2022-09-09 LAB — COMPREHENSIVE METABOLIC PANEL
ALT: 35 U/L (ref 0–35)
AST: 33 U/L (ref 0–37)
Albumin: 4.9 g/dL (ref 3.5–5.2)
Alkaline Phosphatase: 65 U/L (ref 39–117)
BUN: 19 mg/dL (ref 6–23)
CO2: 34 mEq/L — ABNORMAL HIGH (ref 19–32)
Calcium: 10.1 mg/dL (ref 8.4–10.5)
Chloride: 97 mEq/L (ref 96–112)
Creatinine, Ser: 0.72 mg/dL (ref 0.40–1.20)
GFR: 83.6 mL/min (ref >60.00)
Glucose, Bld: 96 mg/dL (ref 70–99)
Potassium: 5.1 mEq/L (ref 3.5–5.1)
Sodium: 138 mEq/L (ref 135–145)
Total Bilirubin: 0.4 mg/dL (ref 0.2–1.2)
Total Protein: 7.8 g/dL (ref 6.0–8.3)

## 2022-09-09 LAB — CBC
HCT: 41.6 % (ref 36.0–46.0)
Hemoglobin: 13.6 g/dL (ref 12.0–15.0)
MCHC: 32.7 g/dL (ref 30.0–36.0)
MCV: 86.2 fl (ref 78.0–100.0)
Platelets: 224 10*3/uL (ref 150.0–400.0)
RBC: 4.82 Mil/uL (ref 3.87–5.11)
RDW: 14.5 % (ref 11.5–15.5)
WBC: 6.2 10*3/uL (ref 4.0–10.5)

## 2022-09-09 LAB — T4, FREE: Free T4: 0.64 ng/dL (ref 0.60–1.60)

## 2022-09-09 LAB — HEMOGLOBIN A1C: Hgb A1c MFr Bld: 6.5 % (ref 4.6–6.5)

## 2022-09-09 LAB — VITAMIN B12: Vitamin B-12: 264 pg/mL (ref 211–911)

## 2022-09-09 LAB — TSH: TSH: 2.72 u[IU]/mL (ref 0.35–5.50)

## 2022-09-09 MED ORDER — AMPHETAMINE-DEXTROAMPHETAMINE 10 MG PO TABS: 10.0000 mg | ORAL_TABLET | Freq: Two times a day (BID) | ORAL | 0 refills | Status: DC

## 2022-09-09 MED ORDER — ZOLPIDEM TARTRATE 10 MG PO TABS
10.0000 mg | ORAL_TABLET | Freq: Every evening | ORAL | 1 refills | Status: DC | PRN
Start: 2022-09-09 — End: 2023-08-26

## 2022-09-09 NOTE — Progress Notes (Signed)
Subjective:   By signing my name below, I, Kellie Simmering, attest that this documentation has been prepared under the direction and in the presence of Mosie Lukes, MD., 09/09/2022.   Patient ID: Tricia Fleming, female    DOB: Oct 25, 1951, 71 y.o.   MRN: [id number]  Chief Complaint  Patient presents with   Depression    Here for Depression   HPI Patient is in today for an office visit.  Patient denies CP/palp/SOB/HA/congestion/fevers c/o.  Bowel Movements: Patient states that she takes Miralax daily to assist her bowel movements. She further reports having frequent flatulence.   Cold Sweats: Patient reports that she experiences intermittent cold sweats, which usually last 15 minutes and cause her to feel clammy.  Depression: Patient is currently taking Methylphenidate 5 mg and Sertraline 100 mg to manage her depression. She reports that Methylphenidate causes her to feel fatigue and does not cause a lasting effect of relief so she is interested in taking Adderall instead.   Refills: She is requesting a refill on Zolpidem 10 mg.  Sleep: She is currently taking Zolpidem 10 mg to help her sleep.  Urology: Patient reports that she experiences urinary frequency at night and urine is leaking. She denies burning or hematuria.   Immunization History  Administered Date(s) Administered   Fluad Quad(high Dose 65+) 07/24/2020, 09/02/2021, 07/22/2022   Influenza Split 08/26/2011   Influenza, High Dose Seasonal PF 08/05/2018, 11/09/2019   Influenza,inj,Quad PF,6+ Mos 07/27/2013, 07/24/2014, 07/06/2015   Influenza-Unspecified 08/25/2018   PFIZER(Purple Top)SARS-COV-2 Vaccination 02/16/2020   Pneumococcal Conjugate-13 07/27/2013   Pneumococcal Polysaccharide-23 07/06/2015   Td 06/18/2010   Tdap 05/29/2020   Zoster Recombinat (Shingrix) 08/25/2018, 10/25/2018   Zoster, Live 01/15/2016   Past Medical History:  Diagnosis Date   Anxiety    Atypical chest pain 12/08/2016   Breast lesion  06/18/2010   Qualifier: Diagnosis of  By: Charlett Blake MD, Harvir Patry     Depression    Depression with anxiety 06/27/2011   Hyperglycemia 07/27/2013   Hyperlipidemia    Osteopenia 01/15/2016   Preventative health care 06/27/2011   Preventative health care 06/27/2011   SCC (squamous cell carcinoma), leg 05/04/2014   SCC    Shoulder pain, bilateral 02/13/2013   Past Surgical History:  Procedure Laterality Date   BREAST BIOPSY Left    needle core biopsy, benign   CHOLECYSTECTOMY     TUBAL LIGATION     Family History  Problem Relation Age of Onset   Diabetes Mother    Hypertension Mother    Coronary artery disease Mother    Cancer Father        metastic prostate   Abdominal Wall Hernia Sister    Hypertension Sister    Alcohol abuse Sister    Memory loss Sister    CVA Sister    Heart disease Sister    Heart disease Brother    Bladder Cancer Brother    Prostate cancer Brother    Benign prostatic hyperplasia Brother    Lung disease Brother        in a smoker   AAA (abdominal aortic aneurysm) Brother    Valvular heart disease Brother    Alcohol abuse Brother    Benign prostatic hyperplasia Brother    Alcohol abuse Brother    Esophageal varices Brother    Diabetes Maternal Grandmother    Coronary artery disease Maternal Grandmother    Diabetes Maternal Grandfather    Drug abuse Son  drug overdose   Cancer Maternal Aunt    Birth defects Paternal Aunt        breast   Social History   Socioeconomic History   Marital status: Married    Spouse name: Not on file   Number of children: 3   Years of education: Not on file   Highest education level: Not on file  Occupational History   Occupation: retired  Tobacco Use   Smoking status: Never   Smokeless tobacco: Never  Substance and Sexual Activity   Alcohol use: Yes    Alcohol/week: 0.0 standard drinks of alcohol    Comment: a couple beers a week   Drug use: No   Sexual activity: Yes    Partners: Male  Other Topics Concern    Not on file  Social History Narrative   Married   2 cats, 1 dog   Babysits her grandson   Social Determinants of Health   Financial Resource Strain: Low Risk  (07/20/2021)   Overall Financial Resource Strain (CARDIA)    Difficulty of Paying Living Expenses: Not hard at all  Food Insecurity: No Food Insecurity (07/22/2022)   Hunger Vital Sign    Worried About Running Out of Food in the Last Year: Never true    [place] in the Last Year: Never true  Transportation Needs: No Transportation Needs (07/20/2021)   PRAPARE - Hydrologist (Medical): No    Lack of Transportation (Non-Medical): No  Physical Activity: Sufficiently Active (07/20/2021)   Exercise Vital Sign    Days of Exercise per Week: 7 days    Minutes of Exercise per Session: 30 min  Stress: No Stress Concern Present (07/20/2021)   [place]    Feeling of Stress : Only a little  Social Connections: Moderately Isolated (07/20/2021)   Social Connection and Isolation Panel [NHANES]    Frequency of Communication with Friends and Family: More than three times a week    Frequency of Social Gatherings with Friends and Family: Twice a week    Attends Religious Services: Never    Marine scientist or Organizations: No    Attends Archivist Meetings: Never    Marital Status: Married  Human resources officer Violence: Not At Risk (07/20/2021)   Humiliation, Afraid, Rape, and Kick questionnaire    Fear of Current or Ex-Partner: No    Emotionally Abused: No    Physically Abused: No    Sexually Abused: No   Outpatient Medications Prior to Visit  Medication Sig Dispense Refill   acetaminophen (TYLENOL) 500 MG tablet Take 500 mg by mouth 2 (two) times daily as needed.     ALPRAZolam (XANAX) 1 MG tablet TAKE 1 TABLET BY MOUTH TWICE A DAY AS NEEDED FOR ANXIETY 180 tablet 1   Biotin 5000 MCG CAPS Take by mouth.     celecoxib  (CELEBREX) 100 MG capsule Take 100 mg by mouth daily.     fish oil-omega-3 fatty acids 1000 MG capsule Take 1,000 mg by mouth daily. 2 caps     nitroGLYCERIN (NITROSTAT) 0.4 MG SL tablet Place 1 tablet (0.4 mg total) under the tongue every 5 (five) minutes as needed for chest pain. 25 tablet 1   rosuvastatin (CRESTOR) 40 MG tablet TAKE 1 TABLET BY MOUTH EVERYDAY AT BEDTIME 90 tablet 1   sertraline (ZOLOFT) 100 MG tablet TAKE 1.5 TABLETS (150 MG TOTAL) BY MOUTH DAILY 135  tablet 0   Turmeric 500 MG CAPS Take 2 capsules by mouth daily.     methylphenidate (RITALIN) 5 MG tablet Take 1.5 tablets (7.5 mg total) by mouth 2 (two) times daily with breakfast and lunch. 90 tablet 0   zolpidem (AMBIEN) 10 MG tablet TAKE 1 TABLET (10 MG TOTAL) BY MOUTH AT BEDTIME AS NEEDED. FOR SLEEP 90 tablet 1   No facility-administered medications prior to visit.   Allergies  Allergen Reactions   Meloxicam Other (See Comments)    "HEADACHE, POOR APPETITE, AND ANXIOUS"   Review of Systems  Constitutional:  Negative for fever.  HENT:  Negative for congestion.   Respiratory:  Negative for shortness of breath.   Cardiovascular:  Negative for chest pain and palpitations.  Gastrointestinal: Negative.   Neurological:  Negative for headaches.  Psychiatric/Behavioral:  Positive for depression.       Objective:    Physical Exam Constitutional:      General: She is not in acute distress.    Appearance: Normal appearance. She is not ill-appearing.  HENT:     Head: Normocephalic and atraumatic.     Right Ear: External ear normal.     Left Ear: External ear normal.     Mouth/Throat:     Mouth: Mucous membranes are moist.     Pharynx: Oropharynx is clear.  Eyes:     Extraocular Movements: Extraocular movements intact.     Pupils: Pupils are equal, round, and reactive to light.  Cardiovascular:     Rate and Rhythm: Normal rate and regular rhythm.     Pulses: Normal pulses.     Heart sounds: Normal heart sounds. No  murmur heard.    No gallop.  Pulmonary:     Effort: Pulmonary effort is normal. No respiratory distress.     Breath sounds: Normal breath sounds. No wheezing or rales.  Abdominal:     General: Bowel sounds are normal.  Skin:    General: Skin is warm and dry.  Neurological:     Mental Status: She is alert and oriented to person, place, and time.  Psychiatric:        Mood and Affect: Mood normal.        Behavior: Behavior normal.        Judgment: Judgment normal.    BP 128/76 (BP Location: Right Arm, Patient Position: Sitting, Cuff Size: Normal)   Pulse (!) 101   Temp 98 F (36.7 C) (Oral)   Resp 16   Ht '5\' 2"'$  (1.575 m)   Wt 165 lb 12.8 oz (75.2 kg)   BMI 30.33 kg/m  Wt Readings from Last 3 Encounters:  09/09/22 165 lb 12.8 oz (75.2 kg)  07/22/22 167 lb 3.2 oz (75.8 kg)  03/21/22 164 lb (74.4 kg)   Diabetic Foot Exam - Simple   No data filed    Lab Results  Component Value Date   WBC 6.3 01/07/2022   HGB 13.9 01/07/2022   HCT 41.3 01/07/2022   PLT 227.0 01/07/2022   GLUCOSE 115 (H) 01/07/2022   CHOL 142 01/07/2022   TRIG 188.0 (H) 01/07/2022   HDL 48.20 01/07/2022   LDLDIRECT 96.0 09/02/2021   LDLCALC 56 01/07/2022   ALT 28 01/07/2022   AST 26 01/07/2022   NA 140 01/07/2022   K 4.8 01/07/2022   CL 99 01/07/2022   CREATININE 0.61 01/07/2022   BUN 22 01/07/2022   CO2 33 (H) 01/07/2022   TSH 2.53 01/07/2022   HGBA1C 6.3  01/07/2022   MICROALBUR <0.7 07/06/2015   Lab Results  Component Value Date   TSH 2.53 01/07/2022   Lab Results  Component Value Date   WBC 6.3 01/07/2022   HGB 13.9 01/07/2022   HCT 41.3 01/07/2022   MCV 86.7 01/07/2022   PLT 227.0 01/07/2022   Lab Results  Component Value Date   NA 140 01/07/2022   K 4.8 01/07/2022   CO2 33 (H) 01/07/2022   GLUCOSE 115 (H) 01/07/2022   BUN 22 01/07/2022   CREATININE 0.61 01/07/2022   BILITOT 0.4 01/07/2022   ALKPHOS 63 01/07/2022   AST 26 01/07/2022   ALT 28 01/07/2022   PROT 7.1  01/07/2022   ALBUMIN 4.8 01/07/2022   CALCIUM 9.9 01/07/2022   GFR 89.81 01/07/2022   Lab Results  Component Value Date   CHOL 142 01/07/2022   Lab Results  Component Value Date   HDL 48.20 01/07/2022   Lab Results  Component Value Date   LDLCALC 56 01/07/2022   Lab Results  Component Value Date   TRIG 188.0 (H) 01/07/2022   Lab Results  Component Value Date   CHOLHDL 3 01/07/2022   Lab Results  Component Value Date   HGBA1C 6.3 01/07/2022      Assessment & Plan:   Problem List Items Addressed This Visit     Insomnia    Encouraged good sleep hygiene such as dark, quiet room. No blue/green glowing lights such as computer screens in bedroom. No alcohol or stimulants in evening. Cut down on caffeine as able. Regular exercise is helpful but not just prior to bed time.  Tolerating Ambien      Relevant Orders   CBC   Depression with anxiety    Stable on Sertraline and Alprazolam prn but still struggles with anhedonia. May consider changing meds or dosing at next visit. Only changing to Adderall today      Hyperglycemia - Primary    hgba1c acceptable, minimize simple carbs. Increase exercise as tolerated. Continue current meds       Relevant Orders   Hemoglobin A1c   TSH   Low vitamin B12 level    Supplement and monitor       Relevant Orders   CBC   Vitamin B12   Hyperlipidemia    Encourage heart healthy diet such as MIND or DASH diet, increase exercise, avoid trans fats, simple carbohydrates and processed foods, consider a krill or fish or flaxseed oil cap daily.  Tolerating Rosuvastatin      Relevant Orders   Comprehensive metabolic panel   Lipid panel   Attention deficit    She feels the Ritalin might make her more tired. Will try switching to Adderall 10 mg po bid and reevaluate at next visit or as needed      Urinary frequency    Notes sensation of having to go but then not being able to go completely, physical exam shows some urethral prolapse.  Referred to urology for further consideration.      Hemorrhoids    External, not imflamed. No changes. F/u with gastroenterology, she agrees to call [facility]      Other Visit Diagnoses     Heat intolerance       Relevant Orders   T4, free      Meds ordered this encounter  Medications   amphetamine-dextroamphetamine (ADDERALL) 10 MG tablet    Sig: Take 1 tablet (10 mg total) by mouth 2 (two) times daily.  Dispense:  60 tablet    Refill:  0   zolpidem (AMBIEN) 10 MG tablet    Sig: Take 1 tablet (10 mg total) by mouth at bedtime as needed. for sleep    Dispense:  90 tablet    Refill:  1    This request is for a new prescription for a controlled substance as required by Federal/State law.   I, Penni Homans, MD, personally preformed the services described in this documentation.  All medical record entries made by the scribe were at my direction and in my presence.  I have reviewed the chart and discharge instructions (if applicable) and agree that the record reflects my personal performance and is accurate and complete. 09/09/2022  I,Mohammed Iqbal,acting as a scribe for Penni Homans, MD.,have documented all relevant documentation on the behalf of Penni Homans, MD,as directed by  Penni Homans, MD while in the presence of Penni Homans, MD.  Penni Homans, MD

## 2022-09-09 NOTE — Assessment & Plan Note (Signed)
Notes sensation of having to go but then not being able to go completely, physical exam shows some urethral prolapse. Referred to urology for further consideration.

## 2022-09-09 NOTE — Assessment & Plan Note (Signed)
She feels the Ritalin might make her more tired. Will try switching to Adderall 10 mg po bid and reevaluate at next visit or as needed

## 2022-09-09 NOTE — Patient Instructions (Signed)
RSV, Respiratory Syncitial Virus, Arexvy at pharmacy Prevnar 20 new pneumonia shotInsomnia Insomnia is a sleep disorder that makes it difficult to fall asleep or stay asleep. Insomnia can cause fatigue, low energy, difficulty concentrating, mood swings, and poor performance at work or school. There are three different ways to classify insomnia: Difficulty falling asleep. Difficulty staying asleep. Waking up too early in the morning. Any type of insomnia can be long-term (chronic) or short-term (acute). Both are common. Short-term insomnia usually lasts for 3 months or less. Chronic insomnia occurs at least three times a week for longer than 3 months. What are the causes? Insomnia may be caused by another condition, situation, or substance, such as: Having certain mental health conditions, such as anxiety and depression. Using caffeine, alcohol, tobacco, or drugs. Having gastrointestinal conditions, such as gastroesophageal reflux disease (GERD). Having certain medical conditions. These include: Asthma. Alzheimer's disease. Stroke. Chronic pain. An overactive thyroid gland (hyperthyroidism). Other sleep disorders, such as restless legs syndrome and sleep apnea. Menopause. Sometimes, the cause of insomnia may not be known. What increases the risk? Risk factors for insomnia include: Gender. Females are affected more often than males. Age. Insomnia is more common as people get older. Stress and certain medical and mental health conditions. Lack of exercise. Having an irregular work schedule. This may include working night shifts and traveling between different time zones. What are the signs or symptoms? If you have insomnia, the main symptom is having trouble falling asleep or having trouble staying asleep. This may lead to other symptoms, such as: Feeling tired or having low energy. Feeling nervous about going to sleep. Not feeling rested in the morning. Having trouble  concentrating. Feeling irritable, anxious, or depressed. How is this diagnosed? This condition may be diagnosed based on: Your symptoms and medical history. Your health care provider may ask about: Your sleep habits. Any medical conditions you have. Your mental health. A physical exam. How is this treated? Treatment for insomnia depends on the cause. Treatment may focus on treating an underlying condition that is causing the insomnia. Treatment may also include: Medicines to help you sleep. Counseling or therapy. Lifestyle adjustments to help you sleep better. Follow these instructions at home: Eating and drinking  Limit or avoid alcohol, caffeinated beverages, and products that contain nicotine and tobacco, especially close to bedtime. These can disrupt your sleep. Do not eat a large meal or eat spicy foods right before bedtime. This can lead to digestive discomfort that can make it hard for you to sleep. Sleep habits  Keep a sleep diary to help you and your health care provider figure out what could be causing your insomnia. Write down: When you sleep. When you wake up during the night. How well you sleep and how rested you feel the next day. Any side effects of medicines you are taking. What you eat and drink. Make your bedroom a dark, comfortable place where it is easy to fall asleep. Put up shades or blackout curtains to block light from outside. Use a white noise machine to block noise. Keep the temperature cool. Limit screen use before bedtime. This includes: Not watching TV. Not using your smartphone, tablet, or computer. Stick to a routine that includes going to bed and waking up at the same times every day and night. This can help you fall asleep faster. Consider making a quiet activity, such as reading, part of your nighttime routine. Try to avoid taking naps during the day so that you sleep better at  night. Get out of bed if you are still awake after 15 minutes of  trying to sleep. Keep the lights down, but try reading or doing a quiet activity. When you feel sleepy, go back to bed. General instructions Take over-the-counter and prescription medicines only as told by your health care provider. Exercise regularly as told by your health care provider. However, avoid exercising in the hours right before bedtime. Use relaxation techniques to manage stress. Ask your health care provider to suggest some techniques that may work well for you. These may include: Breathing exercises. Routines to release muscle tension. Visualizing peaceful scenes. Make sure that you drive carefully. Do not drive if you feel very sleepy. Keep all follow-up visits. This is important. Contact a health care provider if: You are tired throughout the day. You have trouble in your daily routine due to sleepiness. You continue to have sleep problems, or your sleep problems get worse. Get help right away if: You have thoughts about hurting yourself or someone else. Get help right away if you feel like you may hurt yourself or others, or have thoughts about taking your own life. Go to your nearest emergency room or: Call 911. Call the [facility] at [phone] or 988. This is open 24 hours a day. Text the Crisis Text Line at 812-370-2807. Summary Insomnia is a sleep disorder that makes it difficult to fall asleep or stay asleep. Insomnia can be long-term (chronic) or short-term (acute). Treatment for insomnia depends on the cause. Treatment may focus on treating an underlying condition that is causing the insomnia. Keep a sleep diary to help you and your health care provider figure out what could be causing your insomnia. This information is not intended to replace advice given to you by your health care provider. Make sure you discuss any questions you have with your health care provider. Document Revised: 09/23/2021 Document Reviewed: 09/23/2021 Elsevier  Patient Education  [place].

## 2022-09-09 NOTE — Assessment & Plan Note (Signed)
External, not imflamed. No changes. F/u with gastroenterology, she agrees to call [facility] in [facility]

## 2022-09-23 DIAGNOSIS — Z9889 Other specified postprocedural states: Secondary | ICD-10-CM | POA: Diagnosis not present

## 2022-09-23 DIAGNOSIS — H26492 Other secondary cataract, left eye: Secondary | ICD-10-CM | POA: Diagnosis not present

## 2022-09-23 DIAGNOSIS — H35373 Puckering of macula, bilateral: Secondary | ICD-10-CM | POA: Diagnosis not present

## 2022-09-23 DIAGNOSIS — H40013 Open angle with borderline findings, low risk, bilateral: Secondary | ICD-10-CM | POA: Diagnosis not present

## 2022-09-23 DIAGNOSIS — Z961 Presence of intraocular lens: Secondary | ICD-10-CM | POA: Diagnosis not present

## 2022-09-23 DIAGNOSIS — H2511 Age-related nuclear cataract, right eye: Secondary | ICD-10-CM | POA: Diagnosis not present

## 2022-10-02 DIAGNOSIS — H26492 Other secondary cataract, left eye: Secondary | ICD-10-CM | POA: Diagnosis not present

## 2022-10-08 ENCOUNTER — Telehealth: Payer: Self-pay | Admitting: Family Medicine

## 2022-10-08 NOTE — Telephone Encounter (Signed)
Patient wants to know if she can be changed to amphetamine-dextroamphetamine (ADDERALL) 10 MG tablet Extended Release instead of current prescription. If she cannot be changed to the Extended Release, she said she is okay to stay on same medication.   Patient still uses [facility] on [address] in [place]

## 2022-10-08 NOTE — Telephone Encounter (Signed)
Called pt and agreed to staying at 10 mg twice a day And appt made to follow up/ discuss medication

## 2022-10-10 ENCOUNTER — Other Ambulatory Visit: Payer: Self-pay | Admitting: Family Medicine

## 2022-10-10 MED ORDER — AMPHETAMINE-DEXTROAMPHET ER 10 MG PO CP24: 10.0000 mg | ORAL_CAPSULE | Freq: Every day | ORAL | 0 refills | Status: DC

## 2022-10-28 ENCOUNTER — Other Ambulatory Visit: Payer: Self-pay | Admitting: Family Medicine

## 2022-10-28 DIAGNOSIS — Z23 Encounter for immunization: Secondary | ICD-10-CM

## 2022-10-28 DIAGNOSIS — R030 Elevated blood-pressure reading, without diagnosis of hypertension: Secondary | ICD-10-CM

## 2022-10-28 DIAGNOSIS — M8580 Other specified disorders of bone density and structure, unspecified site: Secondary | ICD-10-CM

## 2022-10-28 DIAGNOSIS — E782 Mixed hyperlipidemia: Secondary | ICD-10-CM

## 2022-10-28 DIAGNOSIS — R739 Hyperglycemia, unspecified: Secondary | ICD-10-CM

## 2022-11-27 ENCOUNTER — Ambulatory Visit: Payer: Medicare HMO | Admitting: Family Medicine

## 2022-12-01 ENCOUNTER — Other Ambulatory Visit: Payer: Self-pay | Admitting: Family Medicine

## 2022-12-02 ENCOUNTER — Ambulatory Visit (INDEPENDENT_AMBULATORY_CARE_PROVIDER_SITE_OTHER): Payer: Medicare HMO | Admitting: Family Medicine

## 2022-12-02 VITALS — BP 124/72 | HR 97 | Temp 97.5°F | Resp 16 | Ht 62.0 in | Wt 163.8 lb

## 2022-12-02 DIAGNOSIS — R41840 Attention and concentration deficit: Secondary | ICD-10-CM | POA: Diagnosis not present

## 2022-12-02 DIAGNOSIS — F418 Other specified anxiety disorders: Secondary | ICD-10-CM

## 2022-12-02 DIAGNOSIS — R739 Hyperglycemia, unspecified: Secondary | ICD-10-CM | POA: Diagnosis not present

## 2022-12-02 DIAGNOSIS — R7989 Other specified abnormal findings of blood chemistry: Secondary | ICD-10-CM

## 2022-12-02 DIAGNOSIS — K219 Gastro-esophageal reflux disease without esophagitis: Secondary | ICD-10-CM

## 2022-12-02 DIAGNOSIS — R69 Illness, unspecified: Secondary | ICD-10-CM | POA: Diagnosis not present

## 2022-12-02 MED ORDER — VENLAFAXINE HCL ER 75 MG PO CP24: 75.0000 mg | ORAL_CAPSULE | Freq: Every day | ORAL | 0 refills | Status: DC

## 2022-12-02 MED ORDER — VENLAFAXINE HCL ER 37.5 MG PO CP24: 37.5000 mg | ORAL_CAPSULE | Freq: Every day | ORAL | 0 refills | Status: DC

## 2022-12-02 MED ORDER — AMPHETAMINE-DEXTROAMPHET ER 15 MG PO CP24: 15.0000 mg | ORAL_CAPSULE | ORAL | 0 refills | Status: DC

## 2022-12-02 NOTE — Progress Notes (Signed)
Subjective:   By signing my name below, I, Kellie Simmering, attest that this documentation has been prepared under the direction and in the presence of Mosie Lukes, MD., 12/02/2022.     Patient ID: Tricia Fleming, female    DOB: 1951/06/18, 72 y.o.   MRN: [id number]  Chief Complaint  Patient presents with   Medication Problem    Here to discuss medication   HPI Patient is in today for an office visit. She denies CP/palpitations/SOB/HA/congestion/ fever/chills/GI or GU symptoms.  ADD She continues taking Adderall XR to manage her ADD and is requesting an increase to 15 mg.  Anxiety/Depression Patient continues taking Sertraline 150 mg daily to manage anxiety/depression but states this is not providing her the relief she needs. She experiences anhedonia and is seeking alternate medications to Sertraline.  Past Medical History:  Diagnosis Date   Anxiety    Atypical chest pain 12/08/2016   Breast lesion 06/18/2010   Qualifier: Diagnosis of  By: Charlett Blake MD, Aveya Beal     Depression    Depression with anxiety 06/27/2011   Hyperglycemia 07/27/2013   Hyperlipidemia    Osteopenia 01/15/2016   Preventative health care 06/27/2011   Preventative health care 06/27/2011   SCC (squamous cell carcinoma), leg 05/04/2014   SCC    Shoulder pain, bilateral 02/13/2013    Past Surgical History:  Procedure Laterality Date   BREAST BIOPSY Left    needle core biopsy, benign   CHOLECYSTECTOMY     TUBAL LIGATION      Family History  Problem Relation Age of Onset   Diabetes Mother    Hypertension Mother    Coronary artery disease Mother    Cancer Father        metastic prostate   Abdominal Wall Hernia Sister    Hypertension Sister    Alcohol abuse Sister    Memory loss Sister    CVA Sister    Heart disease Sister    Heart disease Brother    Bladder Cancer Brother    Prostate cancer Brother    Benign prostatic hyperplasia Brother    Lung disease Brother        in a smoker   AAA (abdominal  aortic aneurysm) Brother    Valvular heart disease Brother    Alcohol abuse Brother    Benign prostatic hyperplasia Brother    Alcohol abuse Brother    Esophageal varices Brother    Diabetes Maternal Grandmother    Coronary artery disease Maternal Grandmother    Diabetes Maternal Grandfather    Drug abuse Son        drug overdose   Cancer Maternal Aunt    Birth defects Paternal Aunt        breast    Social History   Socioeconomic History   Marital status: Married    Spouse name: Not on file   Number of children: 3   Years of education: Not on file   Highest education level: Not on file  Occupational History   Occupation: retired  Tobacco Use   Smoking status: Never   Smokeless tobacco: Never  Substance and Sexual Activity   Alcohol use: Yes    Alcohol/week: 0.0 standard drinks of alcohol    Comment: a couple beers a week   Drug use: No   Sexual activity: Yes    Partners: Male  Other Topics Concern   Not on file  Social History Narrative   Married   2 cats, 1 dog  Babysits her grandson   Social Determinants of Health   Financial Resource Strain: Low Risk  (07/20/2021)   Overall Financial Resource Strain (CARDIA)    Difficulty of Paying Living Expenses: Not hard at all  Food Insecurity: No Food Insecurity (07/22/2022)   Hunger Vital Sign    Worried About Running Out of Food in the Last Year: Never true    Ran Out of Food in the Last Year: Never true  Transportation Needs: No Transportation Needs (07/20/2021)   PRAPARE - Hydrologist (Medical): No    Lack of Transportation (Non-Medical): No  Physical Activity: Sufficiently Active (07/20/2021)   Exercise Vital Sign    Days of Exercise per Week: 7 days    Minutes of Exercise per Session: 30 min  Stress: No Stress Concern Present (07/20/2021)   [place]    Feeling of Stress : Only a little  Social Connections:  Moderately Isolated (07/20/2021)   Social Connection and Isolation Panel [NHANES]    Frequency of Communication with Friends and Family: More than three times a week    Frequency of Social Gatherings with Friends and Family: Twice a week    Attends Religious Services: Never    Marine scientist or Organizations: No    Attends Archivist Meetings: Never    Marital Status: Married  Human resources officer Violence: Not At Risk (07/20/2021)   Humiliation, Afraid, Rape, and Kick questionnaire    Fear of Current or Ex-Partner: No    Emotionally Abused: No    Physically Abused: No    Sexually Abused: No    Outpatient Medications Prior to Visit  Medication Sig Dispense Refill   acetaminophen (TYLENOL) 500 MG tablet Take 500 mg by mouth 2 (two) times daily as needed.     ALPRAZolam (XANAX) 1 MG tablet TAKE 1 TABLET BY MOUTH TWICE A DAY AS NEEDED FOR ANXIETY 180 tablet 1   Biotin 5000 MCG CAPS Take by mouth.     celecoxib (CELEBREX) 100 MG capsule Take 100 mg by mouth daily.     fish oil-omega-3 fatty acids 1000 MG capsule Take 1,000 mg by mouth daily. 2 caps     nitroGLYCERIN (NITROSTAT) 0.4 MG SL tablet Place 1 tablet (0.4 mg total) under the tongue every 5 (five) minutes as needed for chest pain. 25 tablet 1   rosuvastatin (CRESTOR) 40 MG tablet TAKE 1 TABLET BY MOUTH EVERYDAY AT BEDTIME 90 tablet 1   Turmeric 500 MG CAPS Take 2 capsules by mouth daily.     zolpidem (AMBIEN) 10 MG tablet Take 1 tablet (10 mg total) by mouth at bedtime as needed. for sleep 90 tablet 1   amphetamine-dextroamphetamine (ADDERALL XR) 10 MG 24 hr capsule Take 1 capsule (10 mg total) by mouth daily. 60 capsule 0   sertraline (ZOLOFT) 100 MG tablet TAKE 1.5 TABLETS (150MG TOTAL) BY MOUTH DAILY 135 tablet 0   No facility-administered medications prior to visit.    Allergies  Allergen Reactions   Meloxicam Other (See Comments)    "HEADACHE, POOR APPETITE, AND ANXIOUS"    Review of Systems   Constitutional:  Negative for chills and fever.  HENT:  Negative for congestion.   Respiratory:  Negative for shortness of breath.   Cardiovascular:  Negative for chest pain and palpitations.  Gastrointestinal:  Negative for abdominal pain, blood in stool, constipation, diarrhea, nausea and vomiting.  Genitourinary:  Negative for dysuria, frequency,  hematuria and urgency.  Skin:           Neurological:  Negative for headaches.       Objective:    Physical Exam Constitutional:      General: She is not in acute distress.    Appearance: Normal appearance. She is normal weight. She is not ill-appearing.  HENT:     Head: Normocephalic and atraumatic.     Right Ear: External ear normal.     Left Ear: External ear normal.     Nose: Nose normal.     Mouth/Throat:     Mouth: Mucous membranes are moist.     Pharynx: Oropharynx is clear.  Eyes:     General:        Right eye: No discharge.        Left eye: No discharge.     Extraocular Movements: Extraocular movements intact.     Conjunctiva/sclera: Conjunctivae normal.     Pupils: Pupils are equal, round, and reactive to light.  Cardiovascular:     Rate and Rhythm: Normal rate and regular rhythm.     Pulses: Normal pulses.     Heart sounds: Normal heart sounds. No murmur heard.    No gallop.  Pulmonary:     Effort: Pulmonary effort is normal. No respiratory distress.     Breath sounds: Normal breath sounds. No wheezing or rales.  Abdominal:     General: Bowel sounds are normal.     Palpations: Abdomen is soft.     Tenderness: There is no abdominal tenderness. There is no guarding.  Musculoskeletal:        General: Normal range of motion.     Cervical back: Normal range of motion.     Right lower leg: No edema.     Left lower leg: No edema.  Skin:    General: Skin is warm and dry.  Neurological:     Mental Status: She is alert and oriented to person, place, and time.  Psychiatric:        Mood and Affect: Mood normal.         Behavior: Behavior normal.        Judgment: Judgment normal.     BP 124/72 (BP Location: Right Arm, Patient Position: Sitting, Cuff Size: Normal)   Pulse 97   Temp (!) 97.5 F (36.4 C) (Oral)   Resp 16   Ht 5' 2"$  (1.575 m)   Wt 163 lb 12.8 oz (74.3 kg)   SpO2 95%   BMI 29.96 kg/m  Wt Readings from Last 3 Encounters:  12/02/22 163 lb 12.8 oz (74.3 kg)  09/09/22 165 lb 12.8 oz (75.2 kg)  07/22/22 167 lb 3.2 oz (75.8 kg)    Diabetic Foot Exam - Simple   No data filed    Lab Results  Component Value Date   WBC 6.2 09/09/2022   HGB 13.6 09/09/2022   HCT 41.6 09/09/2022   PLT 224.0 09/09/2022   GLUCOSE 96 09/09/2022   CHOL 135 09/09/2022   TRIG 180.0 (H) 09/09/2022   HDL 44.70 09/09/2022   LDLDIRECT 96.0 09/02/2021   LDLCALC 55 09/09/2022   ALT 35 09/09/2022   AST 33 09/09/2022   NA 138 09/09/2022   K 5.1 09/09/2022   CL 97 09/09/2022   CREATININE 0.72 09/09/2022   BUN 19 09/09/2022   CO2 34 (H) 09/09/2022   TSH 2.72 09/09/2022   HGBA1C 6.5 09/09/2022   MICROALBUR <0.7 07/06/2015    Lab  Results  Component Value Date   TSH 2.72 09/09/2022   Lab Results  Component Value Date   WBC 6.2 09/09/2022   HGB 13.6 09/09/2022   HCT 41.6 09/09/2022   MCV 86.2 09/09/2022   PLT 224.0 09/09/2022   Lab Results  Component Value Date   NA 138 09/09/2022   K 5.1 09/09/2022   CO2 34 (H) 09/09/2022   GLUCOSE 96 09/09/2022   BUN 19 09/09/2022   CREATININE 0.72 09/09/2022   BILITOT 0.4 09/09/2022   ALKPHOS 65 09/09/2022   AST 33 09/09/2022   ALT 35 09/09/2022   PROT 7.8 09/09/2022   ALBUMIN 4.9 09/09/2022   CALCIUM 10.1 09/09/2022   GFR 83.60 09/09/2022   Lab Results  Component Value Date   CHOL 135 09/09/2022   Lab Results  Component Value Date   HDL 44.70 09/09/2022   Lab Results  Component Value Date   LDLCALC 55 09/09/2022   Lab Results  Component Value Date   TRIG 180.0 (H) 09/09/2022   Lab Results  Component Value Date   CHOLHDL 3  09/09/2022   Lab Results  Component Value Date   HGBA1C 6.5 09/09/2022      Assessment & Plan:  ADD: Adderall XR increased to 15 mg.  Anxiety/Depression: Encouraged counseling ([facility]). Prescribed Venlafaxine 37.5 mg which will be taken with Sertraline 100 mg daily for one week. Sertraline will be reduced to 50 mg the following week and then terminated the week after while Venlafaxine will be increased to 75 mg and then taken by itself daily.  Problem List Items Addressed This Visit     Attention deficit - Primary    Adderall XR 15 mg daily prescription given today      Depression with anxiety    Sertraline has not helped adequately. Switch from Sertraline to Venlafaxine XR 37.5 then titrate up to 75 mg daily. Encouraged counseling as well      Relevant Medications   venlafaxine XR (EFFEXOR XR) 37.5 MG 24 hr capsule   venlafaxine XR (EFFEXOR XR) 75 MG 24 hr capsule   GERD    Avoid offending foods, start probiotics. Do not eat large meals in late evening and consider raising head of bed.        Hyperglycemia    hgba1c acceptable, minimize simple carbs. Increase exercise as tolerated.       Low vitamin B12 level    Supplement and monitor       Meds ordered this encounter  Medications   venlafaxine XR (EFFEXOR XR) 37.5 MG 24 hr capsule    Sig: Take 1 capsule (37.5 mg total) by mouth daily with breakfast.    Dispense:  7 capsule    Refill:  0   venlafaxine XR (EFFEXOR XR) 75 MG 24 hr capsule    Sig: Take 1 capsule (75 mg total) by mouth daily with breakfast.    Dispense:  30 capsule    Refill:  0    Start after completing week of 37.5 mg dose   amphetamine-dextroamphetamine (ADDERALL XR) 15 MG 24 hr capsule    Sig: Take 1 capsule by mouth every morning.    Dispense:  30 capsule    Refill:  0   I, Penni Homans, MD, personally preformed the services described in this documentation.  All medical record entries made by the scribe were at my  direction and in my presence.  I have reviewed the chart and discharge instructions (if applicable)  and agree that the record reflects my personal performance and is accurate and complete. 12/02/2022  I,Mohammed Iqbal,acting as a scribe for Penni Homans, MD.,have documented all relevant documentation on the behalf of Penni Homans, MD,as directed by  Penni Homans, MD while in the presence of Penni Homans, MD.  Penni Homans, MD

## 2022-12-02 NOTE — Patient Instructions (Signed)
Sertraline 100 mg daily x 7 days while you take the Venlafaxine (Effexor) 37.5 mg tabs daily for 1 week The the beginning of week 2 you will drop the Sertraline to 50 mg (1/2 tab) daily for one more week then stop it. That same week you will pick up Venlafaxine 75 mg tab and take 1 daily

## 2022-12-07 NOTE — Assessment & Plan Note (Signed)
Avoid offending foods, start probiotics. Do not eat large meals in late evening and consider raising head of bed.  

## 2022-12-07 NOTE — Assessment & Plan Note (Signed)
Adderall XR 15 mg daily prescription given today

## 2022-12-07 NOTE — Assessment & Plan Note (Signed)
>>  ASSESSMENT AND PLAN FOR MIXED DIABETIC HYPERLIPIDEMIA ASSOCIATED WITH TYPE 2 DIABETES MELLITUS (H[facility]) WRITTEN ON 12/07/2022  3:43 PM BY BLYTH, STACEY A, MD  hgba1c acceptable, minimize simple carbs. Increase exercise as tolerated.

## 2022-12-07 NOTE — Assessment & Plan Note (Signed)
Supplement and monitor 

## 2022-12-07 NOTE — Assessment & Plan Note (Signed)
hgba1c acceptable, minimize simple carbs. Increase exercise as tolerated.  

## 2022-12-07 NOTE — Assessment & Plan Note (Signed)
Sertraline has not helped adequately. Switch from Sertraline to Venlafaxine XR 37.5 then titrate up to 75 mg daily. Encouraged counseling as well

## 2022-12-29 ENCOUNTER — Other Ambulatory Visit: Payer: Self-pay | Admitting: Family Medicine

## 2023-01-01 ENCOUNTER — Other Ambulatory Visit: Payer: Self-pay | Admitting: Family Medicine

## 2023-01-16 ENCOUNTER — Telehealth: Payer: Self-pay | Admitting: Family Medicine

## 2023-01-16 NOTE — Telephone Encounter (Signed)
Prescription Request  01/16/2023  Is this a "Controlled Substance" medicine? Yes  LOV: 12/02/2022  What is the name of the medication or equipment?  amphetamine-dextroamphetamine (ADDERALL XR) 15 MG 24 hr capsule   Have you contacted your pharmacy to request a refill? No   Which pharmacy would you like this sent to?  [id number] - [place],  - [address] [place] [address] Phone: [phone] Fax: [phone]    Patient notified that their request is being sent to the clinical staff for review and that they should receive a response within 2 business days.   Please advise at Mobile [phone] (mobile)

## 2023-01-29 ENCOUNTER — Other Ambulatory Visit: Payer: Self-pay | Admitting: Family Medicine

## 2023-02-05 ENCOUNTER — Other Ambulatory Visit: Payer: Self-pay | Admitting: Family Medicine

## 2023-02-05 ENCOUNTER — Telehealth: Payer: Self-pay | Admitting: Family Medicine

## 2023-02-05 MED ORDER — AMPHETAMINE-DEXTROAMPHET ER 15 MG PO CP24: 15.0000 mg | ORAL_CAPSULE | ORAL | 0 refills | Status: DC

## 2023-02-05 NOTE — Telephone Encounter (Signed)
Prescription Request  02/05/2023  Is this a "Controlled Substance" medicine? Yes  LOV: 12/02/2022  What is the name of the medication or equipment?  amphetamine-dextroamphetamine (ADDERALL XR) 15 MG 24 hr capsule  Have you contacted your pharmacy to request a refill? No   Which pharmacy would you like this sent to?  [id number] - [place], [place] - [address] [place] [address] Phone: [phone] Fax: [phone]    Patient notified that their request is being sent to the clinical staff for review and that they should receive a response within 2 business days.   Please advise at [facility] [phone]

## 2023-02-10 DIAGNOSIS — L821 Other seborrheic keratosis: Secondary | ICD-10-CM | POA: Diagnosis not present

## 2023-02-10 DIAGNOSIS — I781 Nevus, non-neoplastic: Secondary | ICD-10-CM | POA: Diagnosis not present

## 2023-02-10 DIAGNOSIS — L905 Scar conditions and fibrosis of skin: Secondary | ICD-10-CM | POA: Diagnosis not present

## 2023-02-10 DIAGNOSIS — L814 Other melanin hyperpigmentation: Secondary | ICD-10-CM | POA: Diagnosis not present

## 2023-02-10 DIAGNOSIS — D225 Melanocytic nevi of trunk: Secondary | ICD-10-CM | POA: Diagnosis not present

## 2023-02-10 DIAGNOSIS — Z85828 Personal history of other malignant neoplasm of skin: Secondary | ICD-10-CM | POA: Diagnosis not present

## 2023-02-12 DIAGNOSIS — G4700 Insomnia, unspecified: Secondary | ICD-10-CM | POA: Diagnosis not present

## 2023-02-12 DIAGNOSIS — K219 Gastro-esophageal reflux disease without esophagitis: Secondary | ICD-10-CM | POA: Diagnosis not present

## 2023-02-12 DIAGNOSIS — R69 Illness, unspecified: Secondary | ICD-10-CM | POA: Diagnosis not present

## 2023-02-12 DIAGNOSIS — E785 Hyperlipidemia, unspecified: Secondary | ICD-10-CM | POA: Diagnosis not present

## 2023-02-12 DIAGNOSIS — Z809 Family history of malignant neoplasm, unspecified: Secondary | ICD-10-CM | POA: Diagnosis not present

## 2023-02-12 DIAGNOSIS — M8580 Other specified disorders of bone density and structure, unspecified site: Secondary | ICD-10-CM | POA: Diagnosis not present

## 2023-02-12 DIAGNOSIS — Z823 Family history of stroke: Secondary | ICD-10-CM | POA: Diagnosis not present

## 2023-02-12 DIAGNOSIS — E669 Obesity, unspecified: Secondary | ICD-10-CM | POA: Diagnosis not present

## 2023-02-12 DIAGNOSIS — Z8249 Family history of ischemic heart disease and other diseases of the circulatory system: Secondary | ICD-10-CM | POA: Diagnosis not present

## 2023-02-12 DIAGNOSIS — M1990 Unspecified osteoarthritis, unspecified site: Secondary | ICD-10-CM | POA: Diagnosis not present

## 2023-02-12 DIAGNOSIS — R32 Unspecified urinary incontinence: Secondary | ICD-10-CM | POA: Diagnosis not present

## 2023-02-17 DIAGNOSIS — M791 Myalgia, unspecified site: Secondary | ICD-10-CM | POA: Diagnosis not present

## 2023-02-17 DIAGNOSIS — Z6830 Body mass index (BMI) 30.0-30.9, adult: Secondary | ICD-10-CM | POA: Diagnosis not present

## 2023-02-17 DIAGNOSIS — R051 Acute cough: Secondary | ICD-10-CM | POA: Diagnosis not present

## 2023-02-17 DIAGNOSIS — J069 Acute upper respiratory infection, unspecified: Secondary | ICD-10-CM | POA: Diagnosis not present

## 2023-03-03 ENCOUNTER — Other Ambulatory Visit: Payer: Self-pay | Admitting: Family Medicine

## 2023-03-09 ENCOUNTER — Other Ambulatory Visit (HOSPITAL_BASED_OUTPATIENT_CLINIC_OR_DEPARTMENT_OTHER): Payer: Self-pay | Admitting: Family Medicine

## 2023-03-09 DIAGNOSIS — Z1231 Encounter for screening mammogram for malignant neoplasm of breast: Secondary | ICD-10-CM

## 2023-03-11 ENCOUNTER — Telehealth: Payer: Self-pay | Admitting: Family Medicine

## 2023-03-11 ENCOUNTER — Other Ambulatory Visit: Payer: Self-pay | Admitting: Family Medicine

## 2023-03-11 DIAGNOSIS — R739 Hyperglycemia, unspecified: Secondary | ICD-10-CM

## 2023-03-11 DIAGNOSIS — M8580 Other specified disorders of bone density and structure, unspecified site: Secondary | ICD-10-CM

## 2023-03-11 DIAGNOSIS — R030 Elevated blood-pressure reading, without diagnosis of hypertension: Secondary | ICD-10-CM

## 2023-03-11 DIAGNOSIS — E782 Mixed hyperlipidemia: Secondary | ICD-10-CM

## 2023-03-11 DIAGNOSIS — Z23 Encounter for immunization: Secondary | ICD-10-CM

## 2023-03-11 MED ORDER — ALPRAZOLAM 1 MG PO TABS: ORAL_TABLET | ORAL | 0 refills | Status: DC

## 2023-03-11 NOTE — Telephone Encounter (Signed)
Prescription Request  03/11/2023  Is this a "Controlled Substance" medicine? Yes  LOV: 12/02/2022  What is the name of the medication or equipment?  amphetamine-dextroamphetamine (ADDERALL XR) 15 MG 24 hr capsule    Have you contacted your pharmacy to request a refill? No   Which pharmacy would you like this sent to?  [id number] - [place], [place] - [address] [place] [address] Phone: [phone] Fax: [phone]    Patient notified that their request is being sent to the clinical staff for review and that they should receive a response within 2 business days.   Please advise at Mobile [phone] (mobile)

## 2023-03-13 ENCOUNTER — Other Ambulatory Visit: Payer: Self-pay | Admitting: Family Medicine

## 2023-03-13 MED ORDER — AMPHETAMINE-DEXTROAMPHET ER 15 MG PO CP24: 15.0000 mg | ORAL_CAPSULE | ORAL | 0 refills | Status: DC

## 2023-03-13 NOTE — Telephone Encounter (Signed)
Prescription Request   03/11/2023   Is this a "Controlled Substance" medicine? Yes   LOV: 12/02/2022  Requesting:Adderall 15 mg  Contract:4/24 UDS: Last Visit:4/24    Please Advise  What is the name of the medication or equipment?  amphetamine-dextroamphetamine (ADDERALL XR) 15 MG 24 hr capsule      Have you contacted your pharmacy to request a refill? No    Which pharmacy would you like this sent to?  [id number] - [place], [place] - [address] AT [address] FROM [facility] [address] [place] [address] Phone: [phone] Fax: [phone]

## 2023-03-13 NOTE — Telephone Encounter (Signed)
Patient called requesting an update on her Adderall refill. She states she wanted her Adderall filled not her xanax. Please advise.

## 2023-03-15 NOTE — Assessment & Plan Note (Signed)
hgba1c acceptable, minimize simple carbs. Increase exercise as tolerated.  

## 2023-03-15 NOTE — Assessment & Plan Note (Signed)
Encourage heart healthy diet such as MIND or DASH diet, increase exercise, avoid trans fats, simple carbohydrates and processed foods, consider a krill or fish or flaxseed oil cap daily. Tolerating Rosuvastatin 

## 2023-03-15 NOTE — Assessment & Plan Note (Signed)
Stable on Venlafaxine and Alprazolam, still struggling with anhedonia but is not interested in adjusting meds in this space yet. No suicidal ideation noted. She will let us know if she wants to adjust meds.

## 2023-03-15 NOTE — Assessment & Plan Note (Signed)
>>  ASSESSMENT AND PLAN FOR MIXED DIABETIC HYPERLIPIDEMIA ASSOCIATED WITH TYPE 2 DIABETES MELLITUS (HCC) WRITTEN ON 03/15/2023 10:13 PM BY BLYTH, STACEY A, MD  hgba1c acceptable, minimize simple carbs. Increase exercise as tolerated.

## 2023-03-15 NOTE — Assessment & Plan Note (Signed)
Encouraged to get adequate exercise, calcium and vitamin d intake

## 2023-03-15 NOTE — Assessment & Plan Note (Signed)
Supplement and monitor 

## 2023-03-15 NOTE — Assessment & Plan Note (Addendum)
Tolerating Adderall XR 15 mg but still droops in afternoon will allow just 5 mg of short acting Adderall in afternoon prn and reevaluate

## 2023-03-16 ENCOUNTER — Encounter (HOSPITAL_BASED_OUTPATIENT_CLINIC_OR_DEPARTMENT_OTHER): Payer: Self-pay

## 2023-03-16 ENCOUNTER — Ambulatory Visit (HOSPITAL_BASED_OUTPATIENT_CLINIC_OR_DEPARTMENT_OTHER)
Admission: RE | Admit: 2023-03-16 | Discharge: 2023-03-16 | Disposition: A | Discharge status: Home / Self Care | Payer: Medicare HMO | Source: Ambulatory Visit | Attending: Family Medicine | Admitting: Family Medicine

## 2023-03-16 ENCOUNTER — Encounter: Payer: Self-pay | Admitting: Family Medicine

## 2023-03-16 ENCOUNTER — Other Ambulatory Visit: Payer: Self-pay

## 2023-03-16 ENCOUNTER — Ambulatory Visit (INDEPENDENT_AMBULATORY_CARE_PROVIDER_SITE_OTHER): Payer: Medicare HMO | Admitting: Family Medicine

## 2023-03-16 VITALS — BP 128/74 | HR 114 | Temp 97.9°F | Resp 16 | Ht 62.0 in | Wt 166.6 lb

## 2023-03-16 DIAGNOSIS — G4733 Obstructive sleep apnea (adult) (pediatric): Secondary | ICD-10-CM | POA: Insufficient documentation

## 2023-03-16 DIAGNOSIS — Z1211 Encounter for screening for malignant neoplasm of colon: Secondary | ICD-10-CM

## 2023-03-16 DIAGNOSIS — Z79899 Other long term (current) drug therapy: Secondary | ICD-10-CM | POA: Diagnosis not present

## 2023-03-16 DIAGNOSIS — M8580 Other specified disorders of bone density and structure, unspecified site: Secondary | ICD-10-CM

## 2023-03-16 DIAGNOSIS — R739 Hyperglycemia, unspecified: Secondary | ICD-10-CM

## 2023-03-16 DIAGNOSIS — E539 Vitamin B deficiency, unspecified: Secondary | ICD-10-CM | POA: Diagnosis not present

## 2023-03-16 DIAGNOSIS — Z780 Asymptomatic menopausal state: Secondary | ICD-10-CM

## 2023-03-16 DIAGNOSIS — R41840 Attention and concentration deficit: Secondary | ICD-10-CM

## 2023-03-16 DIAGNOSIS — R0683 Snoring: Secondary | ICD-10-CM | POA: Diagnosis not present

## 2023-03-16 DIAGNOSIS — R7989 Other specified abnormal findings of blood chemistry: Secondary | ICD-10-CM | POA: Diagnosis not present

## 2023-03-16 DIAGNOSIS — E2839 Other primary ovarian failure: Secondary | ICD-10-CM

## 2023-03-16 DIAGNOSIS — E785 Hyperlipidemia, unspecified: Secondary | ICD-10-CM

## 2023-03-16 DIAGNOSIS — Z1231 Encounter for screening mammogram for malignant neoplasm of breast: Secondary | ICD-10-CM | POA: Diagnosis not present

## 2023-03-16 DIAGNOSIS — R5383 Other fatigue: Secondary | ICD-10-CM | POA: Diagnosis not present

## 2023-03-16 DIAGNOSIS — F418 Other specified anxiety disorders: Secondary | ICD-10-CM

## 2023-03-16 DIAGNOSIS — M85851 Other specified disorders of bone density and structure, right thigh: Secondary | ICD-10-CM | POA: Diagnosis not present

## 2023-03-16 LAB — CBC WITH DIFFERENTIAL/PLATELET
Basophils Absolute: 0 10*3/uL (ref 0.0–0.1)
Basophils Relative: 0.6 % (ref 0.0–3.0)
Eosinophils Absolute: 0.1 10*3/uL (ref 0.0–0.7)
Eosinophils Relative: 1.7 % (ref 0.0–5.0)
HCT: 41.6 % (ref 36.0–46.0)
Hemoglobin: 13.7 g/dL (ref 12.0–15.0)
Lymphocytes Relative: 42.8 % (ref 12.0–46.0)
Lymphs Abs: 3 10*3/uL (ref 0.7–4.0)
MCHC: 33.1 g/dL (ref 30.0–36.0)
MCV: 85.9 fl (ref 78.0–100.0)
Monocytes Absolute: 0.4 10*3/uL (ref 0.1–1.0)
Monocytes Relative: 5.1 % (ref 3.0–12.0)
Neutro Abs: 3.5 10*3/uL (ref 1.4–7.7)
Neutrophils Relative %: 49.8 % (ref 43.0–77.0)
Platelets: 269 10*3/uL (ref 150.0–400.0)
RBC: 4.84 Mil/uL (ref 3.87–5.11)
RDW: 14.6 % (ref 11.5–15.5)
WBC: 7.1 10*3/uL (ref 4.0–10.5)

## 2023-03-16 LAB — LDL CHOLESTEROL, DIRECT: Direct LDL: 68 mg/dL

## 2023-03-16 LAB — LIPID PANEL
Cholesterol: 137 mg/dL (ref 0–200)
HDL: 47.4 mg/dL (ref >39.00)
NonHDL: 89.76
Total CHOL/HDL Ratio: 3
Triglycerides: 212 mg/dL — ABNORMAL HIGH (ref 0.0–149.0)
VLDL: 42.4 mg/dL — ABNORMAL HIGH (ref 0.0–40.0)

## 2023-03-16 LAB — COMPREHENSIVE METABOLIC PANEL
ALT: 37 U/L — ABNORMAL HIGH (ref 0–35)
AST: 30 U/L (ref 0–37)
Albumin: 4.5 g/dL (ref 3.5–5.2)
Alkaline Phosphatase: 74 U/L (ref 39–117)
BUN: 18 mg/dL (ref 6–23)
CO2: 31 mEq/L (ref 19–32)
Calcium: 10.7 mg/dL — ABNORMAL HIGH (ref 8.4–10.5)
Chloride: 99 mEq/L (ref 96–112)
Creatinine, Ser: 0.74 mg/dL (ref 0.40–1.20)
GFR: 80.6 mL/min (ref >60.00)
Glucose, Bld: 100 mg/dL — ABNORMAL HIGH (ref 70–99)
Potassium: 4.7 mEq/L (ref 3.5–5.1)
Sodium: 141 mEq/L (ref 135–145)
Total Bilirubin: 0.4 mg/dL (ref 0.2–1.2)
Total Protein: 7.6 g/dL (ref 6.0–8.3)

## 2023-03-16 LAB — HEMOGLOBIN A1C: Hgb A1c MFr Bld: 6.5 % (ref 4.6–6.5)

## 2023-03-16 MED ORDER — AMPHETAMINE-DEXTROAMPHETAMINE 5 MG PO TABS: 5.0000 mg | ORAL_TABLET | Freq: Every day | ORAL | 0 refills | Status: DC

## 2023-03-16 MED ORDER — AMPHETAMINE-DEXTROAMPHET ER 15 MG PO CP24: 15.0000 mg | ORAL_CAPSULE | ORAL | 0 refills | Status: DC

## 2023-03-16 NOTE — Assessment & Plan Note (Signed)
With restless sleep and excessive fatigue feels tired as soon as she wakes up she agrees to sleep study she is referred to pulmonology for further consideration

## 2023-03-16 NOTE — Patient Instructions (Signed)
Fatigue If you have fatigue, you feel tired all the time and have a lack of energy or a lack of motivation. Fatigue may make it difficult to start or complete tasks because of exhaustion. Occasional or mild fatigue is often a normal response to activity or life. However, long-term (chronic) or extreme fatigue may be a symptom of a medical condition such as: Depression. Not having enough red blood cells or hemoglobin in the blood (anemia). A problem with a small gland located in the lower front part of the neck (thyroid disorder). Rheumatologic conditions. These are problems related to the body's defense system (immune system). Infections, especially certain viral infections. Fatigue can also lead to negative health outcomes over time. Follow these instructions at home: Medicines Take over-the-counter and prescription medicines only as told by your health care provider. Take a multivitamin if told by your health care provider. Do not use herbal or dietary supplements unless they are approved by your health care provider. Eating and drinking  Avoid heavy meals in the evening. Eat a well-balanced diet, which includes lean proteins, whole grains, plenty of fruits and vegetables, and low-fat dairy products. Avoid eating or drinking too many products with caffeine in them. Avoid alcohol. Drink enough fluid to keep your urine pale yellow. Activity  Exercise regularly, as told by your health care provider. Use or practice techniques to help you relax, such as yoga, tai chi, meditation, or massage therapy. Lifestyle Change situations that cause you stress. Try to keep your work and personal schedules in balance. Do not use recreational or illegal drugs. General instructions Monitor your fatigue for any changes. Go to bed and get up at the same time every day. Avoid fatigue by pacing yourself during the day and getting enough sleep at night. Maintain a healthy weight. Contact a health care  provider if: Your fatigue does not get better. You have a fever. You suddenly lose or gain weight. You have headaches. You have trouble falling asleep or sleeping through the night. You feel angry, guilty, anxious, or sad. You have swelling in your legs or another part of your body. Get help right away if: You feel confused, feel like you might faint, or faint. Your vision is blurry or you have a severe headache. You have severe pain in your abdomen, your back, or the area between your waist and hips (pelvis). You have chest pain, shortness of breath, or an irregular or fast heartbeat. You are unable to urinate, or you urinate less than normal. You have abnormal bleeding from the rectum, nose, lungs, nipples, or, if you are female, the vagina. You vomit blood. You have thoughts about hurting yourself or others. These symptoms may be an emergency. Get help right away. Call 911. Do not wait to see if the symptoms will go away. Do not drive yourself to the hospital. Get help right away if you feel like you may hurt yourself or others, or have thoughts about taking your own life. Go to your nearest emergency room or: Call 911. Call the National Suicide Prevention Lifeline at 1-800-273-8255 or 988. This is open 24 hours a day. Text the Crisis Text Line at 741741. Summary If you have fatigue, you feel tired all the time and have a lack of energy or a lack of motivation. Fatigue may make it difficult to start or complete tasks because of exhaustion. Long-term (chronic) or extreme fatigue may be a symptom of a medical condition. Exercise regularly, as told by your health care provider.   Change situations that cause you stress. Try to keep your work and personal schedules in balance. This information is not intended to replace advice given to you by your health care provider. Make sure you discuss any questions you have with your health care provider. Document Revised: 08/05/2021 Document  Reviewed: 08/05/2021 Elsevier Patient Education  2023 [employer]  

## 2023-03-16 NOTE — Progress Notes (Signed)
Subjective:    Patient ID: Tricia Fleming, female    DOB: [birth year], 72 y.o.   MRN: [id number]  Chief Complaint  Patient presents with   Follow-up    Follow up    HPI Patient is in today for follow up on chronic medical concerns. No recent febrile illness or hospitalizations. Denies CP/palp/SOB/HA/congestion/fevers/GI or GU c/o. Taking meds as prescribed. Her greatest concern is fatigue and lack of joy. She wakes up tired but she denies headaches. She does acknowledge restless sleep and heavy snoring. She endorses anhedonia, not worsening.   Past Medical History:  Diagnosis Date   Anxiety    Atypical chest pain 12/08/2016   Breast lesion 06/18/2010   Qualifier: Diagnosis of  By: Abner Greenspan MD, Danitza Schoenfeldt     Depression    Depression with anxiety 06/27/2011   Hyperglycemia 07/27/2013   Hyperlipidemia    Osteopenia 01/15/2016   Preventative health care 06/27/2011   Preventative health care 06/27/2011   SCC (squamous cell carcinoma), leg 05/04/2014   SCC    Shoulder pain, bilateral 02/13/2013    Past Surgical History:  Procedure Laterality Date   BREAST BIOPSY Left    needle core biopsy, benign   CHOLECYSTECTOMY     TUBAL LIGATION      Family History  Problem Relation Age of Onset   Diabetes Mother    Hypertension Mother    Coronary artery disease Mother    Cancer Father        metastic prostate   Abdominal Wall Hernia Sister    Hypertension Sister    Alcohol abuse Sister    Memory loss Sister    CVA Sister    Heart disease Sister    Heart disease Brother    Bladder Cancer Brother    Prostate cancer Brother    Benign prostatic hyperplasia Brother    Lung disease Brother        in a smoker   AAA (abdominal aortic aneurysm) Brother    Valvular heart disease Brother    Alcohol abuse Brother    Benign prostatic hyperplasia Brother    Alcohol abuse Brother    Esophageal varices Brother    Diabetes Maternal Grandmother    Coronary artery disease Maternal Grandmother     Diabetes Maternal Grandfather    Drug abuse Son        drug overdose   Cancer Maternal Aunt    Birth defects Paternal Aunt        breast    Social History   Socioeconomic History   Marital status: Married    Spouse name: Not on file   Number of children: 3   Years of education: Not on file   Highest education level: Not on file  Occupational History   Occupation: retired  Tobacco Use   Smoking status: Never   Smokeless tobacco: Never  Substance and Sexual Activity   Alcohol use: Yes    Alcohol/week: 0.0 standard drinks of alcohol    Comment: a couple beers a week   Drug use: No   Sexual activity: Yes    Partners: Male  Other Topics Concern   Not on file  Social History Narrative   Married   2 cats, 1 dog   Babysits her grandson   Social Determinants of Health   Financial Resource Strain: Low Risk  (07/20/2021)   Overall Financial Resource Strain (CARDIA)    Difficulty of Paying Living Expenses: Not hard at all  Food Insecurity: No  Food Insecurity (07/22/2022)   Hunger Vital Sign    Worried About Running Out of Food in the Last Year: Never true    Ran Out of Food in the Last Year: Never true  Transportation Needs: No Transportation Needs (07/20/2021)   PRAPARE - Administrator, Civil Service (Medical): No    Lack of Transportation (Non-Medical): No  Physical Activity: Sufficiently Active (07/20/2021)   Exercise Vital Sign    Days of Exercise per Week: 7 days    Minutes of Exercise per Session: 30 min  Stress: No Stress Concern Present (07/20/2021)   [employer] of Occupational Health - Occupational Stress Questionnaire    Feeling of Stress : Only a little  Social Connections: Moderately Isolated (07/20/2021)   Social Connection and Isolation Panel [NHANES]    Frequency of Communication with Friends and Family: More than three times a week    Frequency of Social Gatherings with Friends and Family: Twice a week    Attends Religious Services: Never     Database administrator or Organizations: No    Attends Banker Meetings: Never    Marital Status: Married  Catering manager Violence: Not At Risk (07/20/2021)   Humiliation, Afraid, Rape, and Kick questionnaire    Fear of Current or Ex-Partner: No    Emotionally Abused: No    Physically Abused: No    Sexually Abused: No    Outpatient Medications Prior to Visit  Medication Sig Dispense Refill   acetaminophen (TYLENOL) 500 MG tablet Take 500 mg by mouth 2 (two) times daily as needed.     ALPRAZolam (XANAX) 1 MG tablet TAKE 1 TABLET BY MOUTH TWICE A DAY AS NEEDED FOR ANXIETY 180 tablet 0   amphetamine-dextroamphetamine (ADDERALL XR) 15 MG 24 hr capsule Take 1 capsule by mouth every morning. 30 capsule 0   Biotin 5000 MCG CAPS Take by mouth.     celecoxib (CELEBREX) 100 MG capsule Take 100 mg by mouth daily.     fish oil-omega-3 fatty acids 1000 MG capsule Take 1,000 mg by mouth daily. 2 caps     nitroGLYCERIN (NITROSTAT) 0.4 MG SL tablet Place 1 tablet (0.4 mg total) under the tongue every 5 (five) minutes as needed for chest pain. 25 tablet 1   rosuvastatin (CRESTOR) 40 MG tablet TAKE 1 TABLET BY MOUTH EVERYDAY AT BEDTIME 90 tablet 1   Turmeric 500 MG CAPS Take 2 capsules by mouth daily.     venlafaxine XR (EFFEXOR-XR) 75 MG 24 hr capsule Take 1 capsule (75 mg total) by mouth daily with breakfast. 90 capsule 1   zolpidem (AMBIEN) 10 MG tablet Take 1 tablet (10 mg total) by mouth at bedtime as needed. for sleep 90 tablet 1   No facility-administered medications prior to visit.    Allergies  Allergen Reactions   Meloxicam Other (See Comments)    "HEADACHE, POOR APPETITE, AND ANXIOUS"    Review of Systems  Constitutional:  Positive for malaise/fatigue. Negative for fever.  HENT:  Negative for congestion.   Eyes:  Negative for blurred vision.  Respiratory:  Negative for shortness of breath.   Cardiovascular:  Negative for chest pain, palpitations and leg swelling.   Gastrointestinal:  Negative for abdominal pain, blood in stool and nausea.  Genitourinary:  Negative for dysuria and frequency.  Musculoskeletal:  Negative for falls.  Skin:  Negative for rash.  Neurological:  Negative for dizziness, loss of consciousness and headaches.  Endo/Heme/Allergies:  Negative for environmental  allergies.  Psychiatric/Behavioral:  Positive for depression. The patient is nervous/anxious. The patient does not have insomnia.        Objective:    Physical Exam Constitutional:      General: She is not in acute distress.    Appearance: Normal appearance. She is well-developed. She is not toxic-appearing.  HENT:     Head: Normocephalic and atraumatic.     Right Ear: External ear normal.     Left Ear: External ear normal.     Nose: Nose normal.  Eyes:     General:        Right eye: No discharge.        Left eye: No discharge.     Conjunctiva/sclera: Conjunctivae normal.  Neck:     Thyroid: No thyromegaly.  Cardiovascular:     Rate and Rhythm: Normal rate and regular rhythm.     Heart sounds: Normal heart sounds. No murmur heard. Pulmonary:     Effort: Pulmonary effort is normal. No respiratory distress.     Breath sounds: Normal breath sounds.  Abdominal:     General: Bowel sounds are normal.     Palpations: Abdomen is soft.     Tenderness: There is no abdominal tenderness. There is no guarding.  Musculoskeletal:        General: Normal range of motion.     Cervical back: Neck supple.  Lymphadenopathy:     Cervical: No cervical adenopathy.  Skin:    General: Skin is warm and dry.  Neurological:     Mental Status: She is alert and oriented to person, place, and time.  Psychiatric:        Mood and Affect: Mood normal.        Behavior: Behavior normal.        Thought Content: Thought content normal.        Judgment: Judgment normal.     BP 128/74 (BP Location: Right Arm, Patient Position: Sitting, Cuff Size: Normal)   Pulse (!) 114   Temp 97.9  F (36.6 C) (Oral)   Resp 16   Ht 5\' 2"  (1.575 m)   Wt 166 lb 9.6 oz (75.6 kg)   SpO2 96%   BMI 30.47 kg/m  Wt Readings from Last 3 Encounters:  03/16/23 166 lb 9.6 oz (75.6 kg)  12/02/22 163 lb 12.8 oz (74.3 kg)  09/09/22 165 lb 12.8 oz (75.2 kg)    Diabetic Foot Exam - Simple   No data filed    Lab Results  Component Value Date   WBC 7.1 03/16/2023   HGB 13.7 03/16/2023   HCT 41.6 03/16/2023   PLT 269.0 03/16/2023   GLUCOSE 100 (H) 03/16/2023   CHOL 137 03/16/2023   TRIG 212.0 (H) 03/16/2023   HDL 47.40 03/16/2023   LDLDIRECT 68.0 03/16/2023   LDLCALC 55 09/09/2022   ALT 37 (H) 03/16/2023   AST 30 03/16/2023   NA 141 03/16/2023   K 4.7 03/16/2023   CL 99 03/16/2023   CREATININE 0.74 03/16/2023   BUN 18 03/16/2023   CO2 31 03/16/2023   TSH 4.08 03/16/2023   HGBA1C 6.5 03/16/2023   MICROALBUR <0.7 07/06/2015    Lab Results  Component Value Date   TSH 4.08 03/16/2023   Lab Results  Component Value Date   WBC 7.1 03/16/2023   HGB 13.7 03/16/2023   HCT 41.6 03/16/2023   MCV 85.9 03/16/2023   PLT 269.0 03/16/2023   Lab Results  Component Value Date  NA 141 03/16/2023   K 4.7 03/16/2023   CO2 31 03/16/2023   GLUCOSE 100 (H) 03/16/2023   BUN 18 03/16/2023   CREATININE 0.74 03/16/2023   BILITOT 0.4 03/16/2023   ALKPHOS 74 03/16/2023   AST 30 03/16/2023   ALT 37 (H) 03/16/2023   PROT 7.6 03/16/2023   ALBUMIN 4.5 03/16/2023   CALCIUM 10.7 (H) 03/16/2023   GFR 80.60 03/16/2023   Lab Results  Component Value Date   CHOL 137 03/16/2023   Lab Results  Component Value Date   HDL 47.40 03/16/2023   Lab Results  Component Value Date   LDLCALC 55 09/09/2022   Lab Results  Component Value Date   TRIG 212.0 (H) 03/16/2023   Lab Results  Component Value Date   CHOLHDL 3 03/16/2023   Lab Results  Component Value Date   HGBA1C 6.5 03/16/2023       Assessment & Plan:  Attention deficit Assessment & Plan: Tolerating Adderall XR 15 mg but  still droops in afternoon will allow just 5 mg of short acting Adderall in afternoon prn and reevaluate  Orders: -     CBC with Differential/Platelet  Depression with anxiety Assessment & Plan: Stable on Venlafaxine and Alprazolam, still struggling with anhedonia but is not interested in adjusting meds in this space yet. No suicidal ideation noted. She will let us know if she wants to adjust meds.   Orders: -     TSH  Hyperglycemia Assessment & Plan: hgba1c acceptable, minimize simple carbs. Increase exercise as tolerated.   Orders: -     T4, free -     CBC with Differential/Platelet -     TSH -     Hemoglobin A1c  Hyperlipidemia, unspecified hyperlipidemia type Assessment & Plan: Encourage heart healthy diet such as MIND or DASH diet, increase exercise, avoid trans fats, simple carbohydrates and processed foods, consider a krill or fish or flaxseed oil cap daily.  Tolerating Rosuvastatin  Orders: -     T4, free -     Comprehensive metabolic panel -     Lipid panel -     LDL cholesterol, direct  Low vitamin B12 level Assessment & Plan: Supplement and monitor   Orders: -     Vitamin B12 -     Intrinsic Factor Antibodies -     CBC with Differential/Platelet  Osteopenia, unspecified location Assessment & Plan: Encouraged to get adequate exercise, calcium and vitamin d intake and new Dexa ordered today  Orders: -     DG Bone Density; Future  High risk medication use -     Drug Monitoring Panel [phone] , Urine  Other fatigue -     T4, free -     TSH -     Ambulatory referral to Pulmonology  Snoring Assessment & Plan: With restless sleep and excessive fatigue feels tired as soon as she wakes up she agrees to sleep study she is referred to pulmonology for further consideration  Orders: -     Ambulatory referral to Pulmonology  Estrogen deficiency -     DG Bone Density; Future  Post-menopausal -     DG Bone Density; Future  Colon cancer  screening Assessment & Plan: Has been seen and previously referred to [facility] and wants to return there. New referral placed  Orders: -     Ambulatory referral to Gastroenterology  Other orders -     Amphetamine-Dextroamphetamine; Take 1 tablet (5 mg total) by mouth daily.  May 2024  Dispense: 30 tablet; Refill: 0 -     Amphetamine-Dextroamphetamine; Take 1 tablet (5 mg total) by mouth daily. June 2024  Dispense: 30 tablet; Refill: 0 -     Amphetamine-Dextroamphet ER; Take 1 capsule by mouth every morning. June 2024  Dispense: 30 capsule; Refill: 0    Danise Edge, MD

## 2023-03-16 NOTE — Assessment & Plan Note (Signed)
Has been seen and previously referred to [facility] and wants to return there. New referral placed

## 2023-03-17 ENCOUNTER — Other Ambulatory Visit (INDEPENDENT_AMBULATORY_CARE_PROVIDER_SITE_OTHER): Payer: Medicare HMO

## 2023-03-17 ENCOUNTER — Other Ambulatory Visit: Payer: Self-pay

## 2023-03-17 LAB — VITAMIN B12: Vitamin B-12: 266 pg/mL (ref 211–911)

## 2023-03-17 LAB — T4, FREE: Free T4: 0.58 ng/dL — ABNORMAL LOW (ref 0.60–1.60)

## 2023-03-17 LAB — TSH: TSH: 4.08 u[IU]/mL (ref 0.35–5.50)

## 2023-03-17 LAB — VITAMIN D 25 HYDROXY (VIT D DEFICIENCY, FRACTURES): VITD: 61.21 ng/mL (ref 30.00–100.00)

## 2023-03-17 MED ORDER — LEVOTHYROXINE SODIUM 25 MCG PO TABS: 25.0000 ug | ORAL_TABLET | Freq: Every day | ORAL | 3 refills | Status: DC

## 2023-03-18 LAB — DRUG MONITORING PANEL 376104, URINE
Alphahydroxyalprazolam: 207 ng/mL — ABNORMAL HIGH (ref <25)
Alphahydroxymidazolam: NEGATIVE ng/mL (ref <50)
Alphahydroxytriazolam: NEGATIVE ng/mL (ref <50)
Aminoclonazepam: NEGATIVE ng/mL (ref <25)
Amphetamine: 1082 ng/mL — ABNORMAL HIGH (ref <250)
Amphetamines: POSITIVE ng/mL — AB (ref <500)
Barbiturates: NEGATIVE ng/mL (ref <300)
Benzodiazepines: POSITIVE ng/mL — AB (ref <100)
Cocaine Metabolite: NEGATIVE ng/mL (ref <150)
Desmethyltramadol: NEGATIVE ng/mL (ref <100)
Hydroxyethylflurazepam: NEGATIVE ng/mL (ref <50)
Lorazepam: NEGATIVE ng/mL (ref <50)
Methamphetamine: NEGATIVE ng/mL (ref <250)
Nordiazepam: NEGATIVE ng/mL (ref <50)
Opiates: NEGATIVE ng/mL (ref <100)
Oxazepam: NEGATIVE ng/mL (ref <50)
Oxycodone: NEGATIVE ng/mL (ref <100)
Temazepam: NEGATIVE ng/mL (ref <50)
Tramadol: NEGATIVE ng/mL (ref <100)

## 2023-03-18 LAB — DM TEMPLATE

## 2023-03-19 LAB — INTRINSIC FACTOR ANTIBODIES: Intrinsic Factor: NEGATIVE

## 2023-03-31 DIAGNOSIS — R143 Flatulence: Secondary | ICD-10-CM | POA: Diagnosis not present

## 2023-03-31 DIAGNOSIS — Z1211 Encounter for screening for malignant neoplasm of colon: Secondary | ICD-10-CM | POA: Diagnosis not present

## 2023-03-31 DIAGNOSIS — K5900 Constipation, unspecified: Secondary | ICD-10-CM | POA: Diagnosis not present

## 2023-05-04 ENCOUNTER — Telehealth: Payer: Self-pay | Admitting: Family Medicine

## 2023-05-04 ENCOUNTER — Institutional Professional Consult (permissible substitution): Payer: Medicare HMO | Admitting: Nurse Practitioner

## 2023-05-04 ENCOUNTER — Other Ambulatory Visit: Payer: Self-pay | Admitting: Family Medicine

## 2023-05-04 MED ORDER — AMPHETAMINE-DEXTROAMPHET ER 15 MG PO CP24: 15.0000 mg | ORAL_CAPSULE | ORAL | 0 refills | Status: DC

## 2023-05-04 NOTE — Telephone Encounter (Signed)
Medication: amphetamine-dextroamphetamine (ADDERALL XR) 15 MG 24 hr capsule  Has the patient contacted their pharmacy? Yes.     Preferred Pharmacy:   [id number] - [place], [place] - [address] AT ACROSS FROM [facility] [address] [place] [address] Phone: [phone]  Fax: [phone]

## 2023-05-05 DIAGNOSIS — Z0100 Encounter for examination of eyes and vision without abnormal findings: Secondary | ICD-10-CM | POA: Diagnosis not present

## 2023-05-19 DIAGNOSIS — K648 Other hemorrhoids: Secondary | ICD-10-CM | POA: Diagnosis not present

## 2023-05-19 DIAGNOSIS — Z1211 Encounter for screening for malignant neoplasm of colon: Secondary | ICD-10-CM | POA: Diagnosis not present

## 2023-05-19 LAB — HM COLONOSCOPY

## 2023-05-24 NOTE — Progress Notes (Unsigned)
05/26/23- 72 yoF never smoker for sleep evaluation courtesy of Dr Abner Greenspan with concern of fatigue and snoring Medical problem list includes Cholelithiasis, GERD, Attention Deficit, Anxiety/Depression,  Epworth score-6 Body weight today-171 lbs Patient never had a sleep study -----Was referred by pcp, patient states she feels tired all the time and has been told by husband she does snore No family hx of OSA, no ENT surgery or lung disease.  Occasional ambien for sleep. 2 cups AM coffee. Detailed discussion of OSA, snoring, testing and treatment options for significant OSA. Questions answered.  Prior to Admission medications   Medication Sig Start Date End Date Taking? Authorizing Provider  acetaminophen (TYLENOL) 500 MG tablet Take 500 mg by mouth 2 (two) times daily as needed.   Yes [provider]  ALPRAZolam Prudy Feeler) 1 MG tablet TAKE 1 TABLET BY MOUTH TWICE A DAY AS NEEDED FOR ANXIETY 03/11/23  Yes Bradd Canary, MD  amphetamine-dextroamphetamine (ADDERALL XR) 15 MG 24 hr capsule Take 1 capsule by mouth every morning. August 2024 05/04/23  Yes Bradd Canary, MD  amphetamine-dextroamphetamine (ADDERALL) 5 MG tablet Take 1 tablet (5 mg total) by mouth daily. June 2024 03/16/23  Yes Bradd Canary, MD  celecoxib (CELEBREX) 100 MG capsule Take 100 mg by mouth daily. 12/04/21  Yes [provider]  fish oil-omega-3 fatty acids 1000 MG capsule Take 1,000 mg by mouth daily. 2 caps   Yes [provider]  levothyroxine (SYNTHROID) 25 MCG tablet Take 1 tablet (25 mcg total) by mouth daily. 03/17/23  Yes Bradd Canary, MD  rosuvastatin (CRESTOR) 40 MG tablet TAKE 1 TABLET BY MOUTH EVERYDAY AT BEDTIME 01/01/23  Yes Bradd Canary, MD  Turmeric 500 MG CAPS Take 2 capsules by mouth daily.   Yes [provider]  venlafaxine XR (EFFEXOR-XR) 75 MG 24 hr capsule Take 1 capsule (75 mg total) by mouth daily with breakfast. 01/29/23  Yes Bradd Canary, MD  zolpidem (AMBIEN) 10 MG  tablet Take 1 tablet (10 mg total) by mouth at bedtime as needed. for sleep 09/09/22  Yes Bradd Canary, MD  amphetamine-dextroamphetamine (ADDERALL XR) 15 MG 24 hr capsule Take 1 capsule by mouth every morning. July 2024 05/04/23   Bradd Canary, MD  amphetamine-dextroamphetamine (ADDERALL) 5 MG tablet Take 1 tablet (5 mg total) by mouth daily. May 2024 03/16/23   Bradd Canary, MD  Biotin 5000 MCG CAPS Take by mouth.    [provider]  nitroGLYCERIN (NITROSTAT) 0.4 MG SL tablet Place 1 tablet (0.4 mg total) under the tongue every 5 (five) minutes as needed for chest pain. Patient not taking: Reported on 05/26/2023 02/28/19   Bradd Canary, MD   Past Medical History:  Diagnosis Date   Anxiety    Atypical chest pain 12/08/2016   Breast lesion 06/18/2010   Qualifier: Diagnosis of  By: Abner Greenspan MD, Stacey     Depression    Depression with anxiety 06/27/2011   Hyperglycemia 07/27/2013   Hyperlipidemia    Osteopenia 01/15/2016   Preventative health care 06/27/2011   Preventative health care 06/27/2011   SCC (squamous cell carcinoma), leg 05/04/2014   SCC    Shoulder pain, bilateral 02/13/2013   Past Surgical History:  Procedure Laterality Date   BREAST BIOPSY Left    needle core biopsy, benign   CHOLECYSTECTOMY     TUBAL LIGATION     Family History  Problem Relation Age of Onset   Diabetes Mother  Hypertension Mother    Coronary artery disease Mother    Cancer Father        metastic prostate   Abdominal Wall Hernia Sister    Hypertension Sister    Alcohol abuse Sister    Memory loss Sister    CVA Sister    Heart disease Sister    Heart disease Brother    Bladder Cancer Brother    Prostate cancer Brother    Benign prostatic hyperplasia Brother    Lung disease Brother        in a smoker   AAA (abdominal aortic aneurysm) Brother    Valvular heart disease Brother    Alcohol abuse Brother    Benign prostatic hyperplasia Brother    Alcohol abuse Brother    Esophageal  varices Brother    Diabetes Maternal Grandmother    Coronary artery disease Maternal Grandmother    Diabetes Maternal Grandfather    Drug abuse Son        drug overdose   Cancer Maternal Aunt    Birth defects Paternal Aunt        breast   Social History   Socioeconomic History   Marital status: Married    Spouse name: Not on file   Number of children: 3   Years of education: Not on file   Highest education level: Not on file  Occupational History   Occupation: retired  Tobacco Use   Smoking status: Never   Smokeless tobacco: Never  Substance and Sexual Activity   Alcohol use: Yes    Alcohol/week: 0.0 standard drinks of alcohol    Comment: a couple beers a week   Drug use: No   Sexual activity: Yes    Partners: Male  Other Topics Concern   Not on file  Social History Narrative   Married   2 cats, 1 dog   Babysits her grandson   Social Determinants of Health   Financial Resource Strain: Low Risk  (03/31/2023)   Received from [employer], [facility]   Overall Financial Resource Strain (CARDIA)    Difficulty of Paying Living Expenses: Not hard at all  Food Insecurity: No Food Insecurity (03/31/2023)   Received from [facility], [facility]   Hunger Vital Sign    Worried About Running Out of Food in the Last Year: Never true    Ran Out of Food in the Last Year: Never true  Transportation Needs: No Transportation Needs (03/31/2023)   Received from [facility], [facility]   PRAPARE - Transportation    Lack of Transportation (Medical): No    Lack of Transportation (Non-Medical): No  Physical Activity: Unknown (03/31/2023)   Received from [facility], [facility]   Exercise Vital Sign    Days of Exercise per Week: 0 days    Minutes of Exercise per Session: Not on file  Stress: Stress Concern Present (03/31/2023)   Received from [facility], [facility] of Occupational Health - Occupational Stress Questionnaire    Feeling of Stress :  Rather much  Social Connections: Somewhat Isolated (03/31/2023)   Received from [facility], [facility]   Social Network    How would you rate your social network (family, work, friends)?: Restricted participation with some degree of social isolation  Intimate Partner Violence: Not At Risk (03/31/2023)   Received from [facility], [facility]   HITS    Over the last 12 months how often did your partner physically hurt you?:  1    Over the last 12 months how often did your partner insult you or talk down to you?: 2    Over the last 12 months how often did your partner threaten you with physical harm?: 1    Over the last 12 months how often did your partner scream or curse at you?: 2   ROS-see HPI   + = positive Constitutional:    weight loss, night sweats, fevers, chills, +fatigue, lassitude. HEENT:    headaches, difficulty swallowing, tooth/dental problems, sore throat,       sneezing, itching, ear ache, nasal congestion, post nasal drip, snoring CV:    chest pain, orthopnea, PND, swelling in lower extremities, anasarca,                                  dizziness, palpitations Resp:   shortness of breath with exertion or at rest.                productive cough,   non-productive cough, coughing up of blood.              change in color of mucus.  wheezing.   Skin:    rash or lesions. GI:  No-   heartburn, indigestion, abdominal pain, nausea, vomiting, diarrhea,                 change in bowel habits, loss of appetite GU: dysuria, change in color of urine, no urgency or frequency.   flank pain. MS:   joint pain, +stiffness, decreased range of motion, back pain. Neuro-     nothing unusual Psych:  change in mood or affect.  +depression or +anxiety.   memory loss.  OBJ- Physical Exam General- Alert, Oriented, Affect-appropriate, Distress- none acute, +overweight Skin- rash-none, lesions- none, excoriation- none Lymphadenopathy- none Head- atraumatic            Eyes- Gross vision  intact, PERRLA, conjunctivae and secretions clear            Ears- Hearing, canals-normal            Nose- Clear, no-Septal dev, mucus, polyps, erosion, perforation             Throat- Mallampati IV , mucosa clear , drainage- none, tonsils- atrophic, +teeth Neck- flexible , trachea midline, no stridor , thyroid nl, carotid no bruit Chest - symmetrical excursion , unlabored           Heart/CV- RRR/rapid , no murmur , no gallop  , no rub, nl s1 s2                           - JVD- none , edema- none, stasis changes- none, varices- none           Lung- clear to P&A, wheeze- none, cough- none , dullness-none, rub- none           Chest wall-  Abd-  Br/ Gen/ Rectal- Not done, not indicated Extrem- cyanosis- none, clubbing, none, atrophy- none, strength- nl Neuro- grossly intact to observation

## 2023-05-26 ENCOUNTER — Ambulatory Visit (INDEPENDENT_AMBULATORY_CARE_PROVIDER_SITE_OTHER): Payer: Medicare HMO | Admitting: Internal Medicine

## 2023-05-26 ENCOUNTER — Encounter: Payer: Self-pay | Admitting: Internal Medicine

## 2023-05-26 VITALS — BP 138/76 | HR 98 | Ht 62.0 in | Wt 171.6 lb

## 2023-05-26 DIAGNOSIS — R002 Palpitations: Secondary | ICD-10-CM | POA: Diagnosis not present

## 2023-05-26 DIAGNOSIS — R0683 Snoring: Secondary | ICD-10-CM | POA: Diagnosis not present

## 2023-05-26 NOTE — Patient Instructions (Signed)
Order - schedule home sleep test (she would like to know insurance coverage before going forward) - dx snoring  Please call us about 2 weeks after your sleep test for results and recommendations

## 2023-05-26 NOTE — Assessment & Plan Note (Signed)
Appropriate discussion. She says "everything else has been checked" and the concern is for possible OSA. Plan- sleep study

## 2023-05-26 NOTE — Assessment & Plan Note (Signed)
HR here was rapid, 98/min, and not well heard, but seemed regular.  Watch for possible paroxysmal AFib

## 2023-05-27 ENCOUNTER — Encounter (INDEPENDENT_AMBULATORY_CARE_PROVIDER_SITE_OTHER): Payer: Self-pay

## 2023-06-21 NOTE — Assessment & Plan Note (Signed)
Encouraged to get adequate exercise, calcium and vitamin d intake 

## 2023-06-21 NOTE — Assessment & Plan Note (Signed)
Encourage heart healthy diet such as MIND or DASH diet, increase exercise, avoid trans fats, simple carbohydrates and processed foods, consider a krill or fish or flaxseed oil cap daily.  Tolerating Rosuvastatin 

## 2023-06-21 NOTE — Assessment & Plan Note (Signed)
>>  ASSESSMENT AND PLAN FOR MIXED DIABETIC HYPERLIPIDEMIA ASSOCIATED WITH TYPE 2 DIABETES MELLITUS (HCC) WRITTEN ON 06/21/2023 10:23 PM BY BLYTH, STACEY A, MD  hgba1c acceptable, minimize simple carbs. Increase exercise as tolerated.

## 2023-06-21 NOTE — Assessment & Plan Note (Signed)
hgba1c acceptable, minimize simple carbs. Increase exercise as tolerated.  

## 2023-06-21 NOTE — Assessment & Plan Note (Signed)
Tolerating Adderall XR 15 mg but still droops in afternoon will allow just 5 mg of short acting prn

## 2023-06-22 ENCOUNTER — Ambulatory Visit: Payer: Medicare HMO | Admitting: Family Medicine

## 2023-06-22 ENCOUNTER — Encounter: Payer: Self-pay | Admitting: Family Medicine

## 2023-06-22 VITALS — BP 132/84 | HR 89 | Temp 98.0°F | Resp 16 | Ht 62.0 in | Wt 169.0 lb

## 2023-06-22 DIAGNOSIS — R7989 Other specified abnormal findings of blood chemistry: Secondary | ICD-10-CM

## 2023-06-22 DIAGNOSIS — E785 Hyperlipidemia, unspecified: Secondary | ICD-10-CM | POA: Diagnosis not present

## 2023-06-22 DIAGNOSIS — L989 Disorder of the skin and subcutaneous tissue, unspecified: Secondary | ICD-10-CM | POA: Insufficient documentation

## 2023-06-22 DIAGNOSIS — R739 Hyperglycemia, unspecified: Secondary | ICD-10-CM

## 2023-06-22 DIAGNOSIS — F418 Other specified anxiety disorders: Secondary | ICD-10-CM

## 2023-06-22 DIAGNOSIS — M8580 Other specified disorders of bone density and structure, unspecified site: Secondary | ICD-10-CM

## 2023-06-22 DIAGNOSIS — K219 Gastro-esophageal reflux disease without esophagitis: Secondary | ICD-10-CM | POA: Diagnosis not present

## 2023-06-22 DIAGNOSIS — R41840 Attention and concentration deficit: Secondary | ICD-10-CM

## 2023-06-22 LAB — CBC WITH DIFFERENTIAL/PLATELET
Basophils Absolute: 0.1 10*3/uL (ref 0.0–0.1)
Basophils Relative: 1 % (ref 0.0–3.0)
Eosinophils Absolute: 0.1 10*3/uL (ref 0.0–0.7)
Eosinophils Relative: 1.9 % (ref 0.0–5.0)
HCT: 39.7 % (ref 36.0–46.0)
Hemoglobin: 12.7 g/dL (ref 12.0–15.0)
Lymphocytes Relative: 42 % (ref 12.0–46.0)
Lymphs Abs: 2.2 10*3/uL (ref 0.7–4.0)
MCHC: 32 g/dL (ref 30.0–36.0)
MCV: 87.9 fl (ref 78.0–100.0)
Monocytes Absolute: 0.2 10*3/uL (ref 0.1–1.0)
Monocytes Relative: 4.5 % (ref 3.0–12.0)
Neutro Abs: 2.7 10*3/uL (ref 1.4–7.7)
Neutrophils Relative %: 50.6 % (ref 43.0–77.0)
Platelets: 209 10*3/uL (ref 150.0–400.0)
RBC: 4.52 Mil/uL (ref 3.87–5.11)
RDW: 14.8 % (ref 11.5–15.5)
WBC: 5.3 10*3/uL (ref 4.0–10.5)

## 2023-06-22 LAB — COMPREHENSIVE METABOLIC PANEL
ALT: 38 U/L — ABNORMAL HIGH (ref 0–35)
AST: 33 U/L (ref 0–37)
Albumin: 4.5 g/dL (ref 3.5–5.2)
Alkaline Phosphatase: 71 U/L (ref 39–117)
BUN: 19 mg/dL (ref 6–23)
CO2: 30 mEq/L (ref 19–32)
Calcium: 9.9 mg/dL (ref 8.4–10.5)
Chloride: 104 mEq/L (ref 96–112)
Creatinine, Ser: 0.87 mg/dL (ref 0.40–1.20)
GFR: 66.25 mL/min (ref >60.00)
Glucose, Bld: 113 mg/dL — ABNORMAL HIGH (ref 70–99)
Potassium: 5 mEq/L (ref 3.5–5.1)
Sodium: 144 mEq/L (ref 135–145)
Total Bilirubin: 0.4 mg/dL (ref 0.2–1.2)
Total Protein: 7.4 g/dL (ref 6.0–8.3)

## 2023-06-22 LAB — LIPID PANEL
Cholesterol: 118 mg/dL (ref 0–200)
HDL: 41.6 mg/dL (ref >39.00)
LDL Cholesterol: 51 mg/dL (ref 0–99)
NonHDL: 75.93
Total CHOL/HDL Ratio: 3
Triglycerides: 124 mg/dL (ref 0.0–149.0)
VLDL: 24.8 mg/dL (ref 0.0–40.0)

## 2023-06-22 LAB — TSH: TSH: 1.59 u[IU]/mL (ref 0.35–5.50)

## 2023-06-22 LAB — VITAMIN D 25 HYDROXY (VIT D DEFICIENCY, FRACTURES): VITD: 47.69 ng/mL (ref 30.00–100.00)

## 2023-06-22 LAB — HEMOGLOBIN A1C: Hgb A1c MFr Bld: 6 % (ref 4.6–6.5)

## 2023-06-22 LAB — VITAMIN B12: Vitamin B-12: 556 pg/mL (ref 211–911)

## 2023-06-22 MED ORDER — BUPROPION HCL ER (XL) 150 MG PO TB24
150.0000 mg | ORAL_TABLET | Freq: Every day | ORAL | 3 refills | Status: DC
Start: 2023-06-22 — End: 2023-07-20

## 2023-06-22 MED ORDER — AMPHETAMINE-DEXTROAMPHETAMINE 10 MG PO TABS
10.0000 mg | ORAL_TABLET | Freq: Every day | ORAL | 0 refills | Status: DC | PRN
Start: 2023-06-22 — End: 2023-08-26

## 2023-06-22 MED ORDER — AMPHETAMINE-DEXTROAMPHET ER 20 MG PO CP24: 20.0000 mg | ORAL_CAPSULE | ORAL | 0 refills | Status: DC

## 2023-06-22 NOTE — Assessment & Plan Note (Signed)
She struggles more with anhedonia and hopelessness. Has agreed to taking the Wellbutrin XL 150 mg daily. Will consider counseling was given a pamphlet for [facility]

## 2023-06-22 NOTE — Progress Notes (Signed)
Subjective:    Patient ID: Tricia Fleming, female    DOB: [birth year], 72 y.o.   MRN: [id number]  Chief Complaint  Patient presents with  . Follow-up    Follow up    HPI Discussed the use of AI scribe software for clinical note transcription with the patient, who gave verbal consent to proceed.  History of Present Illness   The patient, with a history of attention deficit disorder and depression, presents with ongoing stress, lack of motivation, and fatigue. The patient reports more bad days than good, with difficulty getting out of bed in the morning due to tiredness and feelings of hopelessness. The patient has been on a low dose of medication for these symptoms but is considering increasing the dosage or switching medications. The patient also mentions a skin lesion on the right leg that has been present for a few weeks and has spread. The patient has a history of taking Adderall for attention deficit disorder and is requesting an increase in dosage.        Past Medical History:  Diagnosis Date  . Anxiety   . Atypical chest pain 12/08/2016  . Breast lesion 06/18/2010   Qualifier: Diagnosis of  By: Abner Greenspan MD, Misty Stanley    . Depression   . Depression with anxiety 06/27/2011  . Hyperglycemia 07/27/2013  . Hyperlipidemia   . Osteopenia 01/15/2016  . Preventative health care 06/27/2011  . Preventative health care 06/27/2011  . SCC (squamous cell carcinoma), leg 05/04/2014   SCC   . Shoulder pain, bilateral 02/13/2013    Past Surgical History:  Procedure Laterality Date  . BREAST BIOPSY Left    needle core biopsy, benign  . CHOLECYSTECTOMY    . TUBAL LIGATION      Family History  Problem Relation Age of Onset  . Diabetes Mother   . Hypertension Mother   . Coronary artery disease Mother   . Cancer Father        metastic prostate  . Abdominal Wall Hernia Sister   . Hypertension Sister   . Alcohol abuse Sister   . Memory loss Sister   . CVA Sister   . Heart disease Sister   .  Heart disease Brother   . Bladder Cancer Brother   . Prostate cancer Brother   . Benign prostatic hyperplasia Brother   . Lung disease Brother        in a smoker  . AAA (abdominal aortic aneurysm) Brother   . Valvular heart disease Brother   . Alcohol abuse Brother   . Benign prostatic hyperplasia Brother   . Alcohol abuse Brother   . Esophageal varices Brother   . Diabetes Maternal Grandmother   . Coronary artery disease Maternal Grandmother   . Diabetes Maternal Grandfather   . Drug abuse Son        drug overdose  . Cancer Maternal Aunt   . Birth defects Paternal Aunt        breast    Social History   Socioeconomic History  . Marital status: Married    Spouse name: Not on file  . Number of children: 3  . Years of education: Not on file  . Highest education level: Not on file  Occupational History  . Occupation: retired  Tobacco Use  . Smoking status: Never  . Smokeless tobacco: Never  Substance and Sexual Activity  . Alcohol use: Yes    Alcohol/week: 0.0 standard drinks of alcohol    Comment: a couple  beers a week  . Drug use: No  . Sexual activity: Yes    Partners: Male  Other Topics Concern  . Not on file  Social History Narrative   Married   2 cats, 1 dog   Babysits her grandson   Social Determinants of Health   Financial Resource Strain: Low Risk  (03/31/2023)   Received from [employer], [facility]   Overall Financial Resource Strain (CARDIA)   . Difficulty of Paying Living Expenses: Not hard at all  Food Insecurity: No Food Insecurity (03/31/2023)   Received from [facility], [facility]   Hunger Vital Sign   . Worried About Programme researcher, broadcasting/film/video in the Last Year: Never true   . Ran Out of Food in the Last Year: Never true  Transportation Needs: No Transportation Needs (03/31/2023)   Received from [facility], [facility]   [facility] - Transportation   . Lack of Transportation (Medical): No   . Lack of Transportation (Non-Medical): No   Physical Activity: Unknown (03/31/2023)   Received from [facility], [facility]   Exercise Vital Sign   . Days of Exercise per Week: 0 days   . Minutes of Exercise per Session: Not on file  Stress: Stress Concern Present (03/31/2023)   Received from [facility], [facility] - Occupational Stress Questionnaire   . Feeling of Stress : Rather much  Social Connections: Somewhat Isolated (03/31/2023)   Received from [facility], [facility]   Social Network   . How would you rate your social network (family, work, friends)?: Restricted participation with some degree of social isolation  Intimate Partner Violence: Not At Risk (03/31/2023)   Received from [facility], [facility]   HITS   . Over the last 12 months how often did your partner physically hurt you?: 1   . Over the last 12 months how often did your partner insult you or talk down to you?: 2   . Over the last 12 months how often did your partner threaten you with physical harm?: 1   . Over the last 12 months how often did your partner scream or curse at you?: 2    Outpatient Medications Prior to Visit  Medication Sig Dispense Refill  . acetaminophen (TYLENOL) 500 MG tablet Take 500 mg by mouth 2 (two) times daily as needed.    . ALPRAZolam (XANAX) 1 MG tablet TAKE 1 TABLET BY MOUTH TWICE A DAY AS NEEDED FOR ANXIETY 180 tablet 0  . Biotin 5000 MCG CAPS Take by mouth.    . celecoxib (CELEBREX) 100 MG capsule Take 100 mg by mouth daily.    . fish oil-omega-3 fatty acids 1000 MG capsule Take 1,000 mg by mouth daily. 2 caps    . levothyroxine (SYNTHROID) 25 MCG tablet Take 1 tablet (25 mcg total) by mouth daily. 90 tablet 3  . nitroGLYCERIN (NITROSTAT) 0.4 MG SL tablet Place 1 tablet (0.4 mg total) under the tongue every 5 (five) minutes as needed for chest pain. 25 tablet 1  . rosuvastatin (CRESTOR) 40 MG tablet TAKE 1 TABLET BY MOUTH EVERYDAY AT BEDTIME 90 tablet 1  . Turmeric 500  MG CAPS Take 2 capsules by mouth daily.    Marland Kitchen zolpidem (AMBIEN) 10 MG tablet Take 1 tablet (10 mg total) by mouth at bedtime as needed. for sleep 90 tablet 1  . amphetamine-dextroamphetamine (ADDERALL XR) 15 MG 24 hr capsule Take 1 capsule by mouth every  morning. July 2024 30 capsule 0  . amphetamine-dextroamphetamine (ADDERALL XR) 15 MG 24 hr capsule Take 1 capsule by mouth every morning. August 2024 30 capsule 0  . amphetamine-dextroamphetamine (ADDERALL) 5 MG tablet Take 1 tablet (5 mg total) by mouth daily. May 2024 30 tablet 0  . amphetamine-dextroamphetamine (ADDERALL) 5 MG tablet Take 1 tablet (5 mg total) by mouth daily. June 2024 30 tablet 0  . venlafaxine XR (EFFEXOR-XR) 75 MG 24 hr capsule Take 1 capsule (75 mg total) by mouth daily with breakfast. 90 capsule 1   No facility-administered medications prior to visit.    Allergies  Allergen Reactions  . Meloxicam Other (See Comments)    "HEADACHE, POOR APPETITE, AND ANXIOUS"    Review of Systems  Constitutional:  Positive for malaise/fatigue. Negative for fever.  HENT:  Negative for congestion.   Eyes:  Negative for blurred vision.  Respiratory:  Negative for shortness of breath.   Cardiovascular:  Negative for chest pain, palpitations and leg swelling.  Gastrointestinal:  Negative for abdominal pain, blood in stool and nausea.  Genitourinary:  Negative for dysuria and frequency.  Musculoskeletal:  Negative for falls.  Skin:  Positive for rash.  Neurological:  Negative for dizziness, loss of consciousness and headaches.  Endo/Heme/Allergies:  Negative for environmental allergies.  Psychiatric/Behavioral:  Positive for depression. Negative for suicidal ideas. The patient is nervous/anxious.        Objective:    Physical Exam Constitutional:      General: She is not in acute distress.    Appearance: Normal appearance. She is well-developed. She is not toxic-appearing.  HENT:     Head: Normocephalic and atraumatic.      Right Ear: External ear normal.     Left Ear: External ear normal.     Nose: Nose normal.  Eyes:     General:        Right eye: No discharge.        Left eye: No discharge.     Conjunctiva/sclera: Conjunctivae normal.  Neck:     Thyroid: No thyromegaly.  Cardiovascular:     Rate and Rhythm: Normal rate and regular rhythm.     Heart sounds: Normal heart sounds. No murmur heard. Pulmonary:     Effort: Pulmonary effort is normal. No respiratory distress.     Breath sounds: Normal breath sounds.  Abdominal:     General: Bowel sounds are normal.     Palpations: Abdomen is soft.     Tenderness: There is no abdominal tenderness. There is no guarding.  Musculoskeletal:        General: Normal range of motion.     Cervical back: Neck supple.  Lymphadenopathy:     Cervical: No cervical adenopathy.  Skin:    General: Skin is warm and dry.  Neurological:     Mental Status: She is alert and oriented to person, place, and time.  Psychiatric:        Mood and Affect: Mood normal.        Behavior: Behavior normal.        Thought Content: Thought content normal.        Judgment: Judgment normal.    BP 132/84 (BP Location: Left Arm, Patient Position: Sitting, Cuff Size: Normal)   Pulse 89   Temp 98 F (36.7 C) (Oral)   Resp 16   Ht 5\' 2"  (1.575 m)   Wt 169 lb (76.7 kg)   SpO2 97%   BMI 30.91 kg/m  Wt  Readings from Last 3 Encounters:  06/22/23 169 lb (76.7 kg)  05/26/23 171 lb 9.6 oz (77.8 kg)  03/16/23 166 lb 9.6 oz (75.6 kg)    Diabetic Foot Exam - Simple   No data filed    Lab Results  Component Value Date   WBC 5.3 06/22/2023   HGB 12.7 06/22/2023   HCT 39.7 06/22/2023   PLT 209.0 06/22/2023   GLUCOSE 113 (H) 06/22/2023   CHOL 118 06/22/2023   TRIG 124.0 06/22/2023   HDL 41.60 06/22/2023   LDLDIRECT 68.0 03/16/2023   LDLCALC 51 06/22/2023   ALT 38 (H) 06/22/2023   AST 33 06/22/2023   NA 144 06/22/2023   K 5.0 06/22/2023   CL 104 06/22/2023   CREATININE 0.87  06/22/2023   BUN 19 06/22/2023   CO2 30 06/22/2023   TSH 1.59 06/22/2023   HGBA1C 6.0 06/22/2023   MICROALBUR <0.7 07/06/2015    Lab Results  Component Value Date   TSH 1.59 06/22/2023   Lab Results  Component Value Date   WBC 5.3 06/22/2023   HGB 12.7 06/22/2023   HCT 39.7 06/22/2023   MCV 87.9 06/22/2023   PLT 209.0 06/22/2023   Lab Results  Component Value Date   NA 144 06/22/2023   K 5.0 06/22/2023   CO2 30 06/22/2023   GLUCOSE 113 (H) 06/22/2023   BUN 19 06/22/2023   CREATININE 0.87 06/22/2023   BILITOT 0.4 06/22/2023   ALKPHOS 71 06/22/2023   AST 33 06/22/2023   ALT 38 (H) 06/22/2023   PROT 7.4 06/22/2023   ALBUMIN 4.5 06/22/2023   CALCIUM 9.9 06/22/2023   GFR 66.25 06/22/2023   Lab Results  Component Value Date   CHOL 118 06/22/2023   Lab Results  Component Value Date   HDL 41.60 06/22/2023   Lab Results  Component Value Date   LDLCALC 51 06/22/2023   Lab Results  Component Value Date   TRIG 124.0 06/22/2023   Lab Results  Component Value Date   CHOLHDL 3 06/22/2023   Lab Results  Component Value Date   HGBA1C 6.0 06/22/2023       Assessment & Plan:  Attention deficit Assessment & Plan: Tolerating Adderall XR 15 mg but still droops in afternoon will allow just 5 mg of short acting prn  Orders: -     Vitamin B12  Hyperglycemia Assessment & Plan: hgba1c acceptable, minimize simple carbs. Increase exercise as tolerated.   Orders: -     Hemoglobin A1c -     TSH  Hyperlipidemia, unspecified hyperlipidemia type Assessment & Plan: Encourage heart healthy diet such as MIND or DASH diet, increase exercise, avoid trans fats, simple carbohydrates and processed foods, consider a krill or fish or flaxseed oil cap daily.  Tolerating Rosuvastatin  Orders: -     Lipid panel  Osteopenia, unspecified location Assessment & Plan: Encouraged to get adequate exercise, calcium and vitamin d intake    Depression with anxiety Assessment &  Plan: She struggles more with anhedonia and hopelessness. Has agreed to taking the Wellbutrin XL 150 mg daily. Will consider counseling was given a pamphlet for [facility]   Skin lesion of right leg Assessment & Plan: On shin, present several weeks has been picking at it agrees to see her dermatologist    Hypercalcemia -     Comprehensive metabolic panel -     VITAMIN D 25 Hydroxy (Vit-D Deficiency, Fractures) -     Parathyroid hormone, intact (no Ca)  Gastroesophageal  reflux disease without esophagitis  Low vitamin B12 level -     CBC with Differential/Platelet  Other orders -     buPROPion HCl ER (XL); Take 1 tablet (150 mg total) by mouth daily.  Dispense: 30 tablet; Refill: 3 -     Amphetamine-Dextroamphet ER; Take 1 capsule (20 mg total) by mouth every morning. August 2024  Dispense: 30 capsule; Refill: 0 -     Amphetamine-Dextroamphetamine; Take 1 tablet (10 mg total) by mouth daily as needed. August 2024  Dispense: 30 tablet; Refill: 0    Assessment and Plan    Depression Persistent feelings of hopelessness and lack of motivation. Currently on a low dose of an unspecified medication, with possible worsening of symptoms. Discussed the potential benefits of counseling and the need for consistent medication use. -Increase current antidepressant medication to next strength. -Consider counseling as an additional tool for managing symptoms. -Will discontinue Venlafaxine and start Wellbutrin XL 150 mg daily.  Attention Deficit Hyperactivity Disorder (ADHD) Currently on Adderall 15mg  and 5mg , with some relief but not enough. No concerning side effects reported. -Increase Adderall from 15mg  to 20mg  and from 5mg  to 10mg . -Consider switching to Ritalin or Vyvanse if Adderall is not effective.  Skin Lesion, Right Leg Present for approximately 2-3 weeks. Patient has a history of squamous cell carcinoma. -Advise patient to stop picking at the lesion. -Apply antibiotic ointment to the  lesion. -If not notably better within a week, return to dermatology for evaluation.  Hypercalcemia Previous high calcium levels, possibly related to high-dose vitamin D supplementation. Patient has stopped taking calcium and vitamin D supplements. -Check calcium and parathyroid hormone (PTH) levels. -Advise patient to continue consuming dietary calcium to prevent bone loss.  General Health Maintenance -Check vitamin B12 and vitamin D levels. -Check blood glucose levels. -Consider chair yoga for increased flexibility and strength.         Danise Edge, MD

## 2023-06-22 NOTE — Patient Instructions (Signed)

## 2023-06-22 NOTE — Assessment & Plan Note (Signed)
On shin, present several weeks has been picking at it agrees to see her dermatologist

## 2023-06-23 LAB — PARATHYROID HORMONE, INTACT (NO CA): PTH: 37 pg/mL (ref 16–77)

## 2023-06-25 DIAGNOSIS — G4730 Sleep apnea, unspecified: Secondary | ICD-10-CM | POA: Diagnosis not present

## 2023-06-26 ENCOUNTER — Other Ambulatory Visit: Payer: Self-pay | Admitting: Family Medicine

## 2023-06-30 ENCOUNTER — Telehealth: Payer: Self-pay | Admitting: Family Medicine

## 2023-06-30 NOTE — Telephone Encounter (Signed)
Prescription Request  06/30/2023  Is this a "Controlled Substance" medicine? Yes  LOV: 06/22/2023  What is the name of the medication or equipment?   ALPRAZolam (XANAX) 1 MG tablet [[id number]]  Have you contacted your pharmacy to request a refill? No   Which pharmacy would you like this sent to?  [id number] - [place], [place] - [address]OSS [address] [place] [address] Phone: [phone] Fax: [phone]    Patient notified that their request is being sent to the clinical staff for review and that they should receive a response within 2 business days.   Please advise at Mobile [phone] (mobile)

## 2023-07-13 DIAGNOSIS — L570 Actinic keratosis: Secondary | ICD-10-CM | POA: Diagnosis not present

## 2023-07-16 DIAGNOSIS — G4733 Obstructive sleep apnea (adult) (pediatric): Secondary | ICD-10-CM

## 2023-07-16 DIAGNOSIS — R0683 Snoring: Secondary | ICD-10-CM

## 2023-07-18 ENCOUNTER — Other Ambulatory Visit: Payer: Self-pay | Admitting: Family Medicine

## 2023-07-24 ENCOUNTER — Telehealth: Payer: Self-pay | Admitting: Family Medicine

## 2023-07-24 NOTE — Telephone Encounter (Signed)
Copied from CRM [id number]. Topic: Medicare AWV >> Jul 24, 2023  9:18 AM Payton Doughty wrote: Reason for CRM: Called LM 07/24/2023 to schedule AWV   Verlee Rossetti; Care Guide Ambulatory Clinical Support [place] [facility] Direct Dial: [phone]

## 2023-08-26 ENCOUNTER — Other Ambulatory Visit: Payer: Self-pay | Admitting: Family Medicine

## 2023-08-26 DIAGNOSIS — M8580 Other specified disorders of bone density and structure, unspecified site: Secondary | ICD-10-CM

## 2023-08-26 DIAGNOSIS — Z23 Encounter for immunization: Secondary | ICD-10-CM

## 2023-08-26 DIAGNOSIS — R739 Hyperglycemia, unspecified: Secondary | ICD-10-CM

## 2023-08-26 DIAGNOSIS — E782 Mixed hyperlipidemia: Secondary | ICD-10-CM

## 2023-08-26 DIAGNOSIS — R030 Elevated blood-pressure reading, without diagnosis of hypertension: Secondary | ICD-10-CM

## 2023-08-26 MED ORDER — AMPHETAMINE-DEXTROAMPHET ER 20 MG PO CP24: 20.0000 mg | ORAL_CAPSULE | ORAL | 0 refills | Status: DC

## 2023-08-26 MED ORDER — ZOLPIDEM TARTRATE 10 MG PO TABS: 10.0000 mg | ORAL_TABLET | Freq: Every evening | ORAL | 1 refills | Status: DC | PRN

## 2023-08-26 MED ORDER — AMPHETAMINE-DEXTROAMPHETAMINE 10 MG PO TABS: 10.0000 mg | ORAL_TABLET | Freq: Every day | ORAL | 0 refills | Status: DC | PRN

## 2023-08-26 MED ORDER — ALPRAZOLAM 1 MG PO TABS
ORAL_TABLET | ORAL | 0 refills | Status: DC
Start: 2023-08-26 — End: 2023-12-02

## 2023-08-26 NOTE — Progress Notes (Signed)
05/26/23- 72 yoF never smoker for sleep evaluation courtesy of Dr Abner Greenspan with concern of fatigue and snoring Medical problem list includes Cholelithiasis, GERD, Attention Deficit, Anxiety/Depression,  Epworth score-6 Body weight today-171 lbs Patient never had a sleep study -----Was referred by pcp, patient states she feels tired all the time and has been told by husband she does snore No family hx of OSA, no ENT surgery or lung disease.  Occasional ambien for sleep. 2 cups AM coffee. Detailed discussion of OSA, snoring, testing and treatment options for significant OSA. Questions answered.  08/28/23- 72 yoF never smoker followed for OSA, complicated by Cholelithiasis, GERD, Attention Deficit, Anxiety/Depression, Htyperlipidemia,  HST 06/25/23- AHI 29/hr, desaturation to 63%, body weight 171 lbs Body weight today-170 lbs For treatment decision We discussed medical reasons for treating OSA, role of weight, sleep position, driving safety, treatment options. Questions answered. Original concerns were persistent tiredness and snoring. Takes adderall for parallel dx ADD. She is going home to consider and will let us know what she decides to do. Declines flu vax.  ROS-see HPI   + = positive Constitutional:    weight loss, night sweats, fevers, chills, +fatigue, lassitude. HEENT:    headaches, difficulty swallowing, tooth/dental problems, sore throat,       sneezing, itching, ear ache, nasal congestion, post nasal drip, snoring CV:    chest pain, orthopnea, PND, swelling in lower extremities, anasarca,         dizziness, palpitations Resp:   shortness of breath with exertion or at rest.                productive cough,   non-productive cough, coughing up of blood.              change in color of mucus.  wheezing.   Skin:    rash or lesions. GI:  No-   heartburn, indigestion, abdominal pain, nausea, vomiting, diarrhea,                 change in bowel habits, loss of appetite GU: dysuria, change in  color of urine, no urgency or frequency.   flank pain. MS:   joint pain, +stiffness, decreased range of motion, back pain. Neuro-     nothing unusual Psych:  change in mood or affect.  +depression or +anxiety.   memory loss.  OBJ- Physical Exam General- Alert, Oriented, Affect-appropriate, Distress- none acute, +overweight Skin- rash-none, lesions- none, excoriation- none Lymphadenopathy- none Head- atraumatic            Eyes- Gross vision intact, PERRLA, conjunctivae and secretions clear            Ears- Hearing, canals-normal            Nose- Clear, no-Septal dev, mucus, polyps, erosion, perforation             Throat- Mallampati IV , mucosa clear , drainage- none, tonsils- atrophic, +teeth Neck- flexible , trachea midline, no stridor , thyroid nl, carotid no bruit Chest - symmetrical excursion , unlabored           Heart/CV- RRR/rapid , no murmur , no gallop  , no rub, nl s1 s2                           - JVD- none , edema- none, stasis changes- none, varices- none           Lung- clear to P&A, wheeze- none, cough- none ,  dullness-none, rub- none           Chest wall-  Abd-  Br/ Gen/ Rectal- Not done, not indicated Extrem- cyanosis- none, clubbing, none, atrophy- none, strength- nl Neuro- grossly intact to observation

## 2023-08-26 NOTE — Telephone Encounter (Signed)
Requesting: multiple meds, see pended orders Contract: 03/16/23 UDS: 03/16/23 Last Visit: 06/22/23 Next Visit: 10/06/23 Last Refill: multiple controlled meds- see med list  Please Advise

## 2023-08-26 NOTE — Telephone Encounter (Signed)
Prescription Request  08/26/2023  Is this a "Controlled Substance" medicine? No  LOV: 06/22/2023  What is the name of the medication or equipment? ALPRAZolam (XANAX) 1 MG tablet zolpidem (AMBIEN) 10 MG tablet  amphetamine-dextroamphetamine (ADDERALL) 10 MG tablet amphetamine-dextroamphetamine (ADDERALL XR) 20 MG 24 hr capsule  Have you contacted your pharmacy to request a refill? No   Which pharmacy would you like this sent to?  [id number] [place], [place] - [address] [place] [address] Phone: [phone] Fax: [phone]    Patient notified that their request is being sent to the clinical staff for review and that they should receive a response within 2 business days.   Please advise at [facility] [phone]

## 2023-08-26 NOTE — Addendum Note (Signed)
Addended by: Conrad Stagecoach D on: 08/26/2023 04:13 PM   Modules accepted: Orders

## 2023-08-27 ENCOUNTER — Encounter: Payer: Self-pay | Admitting: Pharmacist

## 2023-08-27 NOTE — Progress Notes (Unsigned)
Pharmacy Quality Measure Review  This patient is appearing on a report for being at risk of failing the adherence measure for cholesterol (statin) medications this calendar year.   Medication: rosuvastatin 40mg   Last fill date: 06/21/2023 for 90 day supply  Other fills in 2024 - 01/01/2023 for 90 day supply  Reviewed Dr Tiajuana Amass Database - patient filled rosuvastatin 08/23/2023 for 90 DS.  This should have her finish at about 82% adherence for 2024.  Plan to follow adherence in 2025  Insurance report was not up to date. No action needed at this time.   Henrene Pastor, PharmD Clinical Pharmacist [facility]  Population Health [phone]

## 2023-08-28 ENCOUNTER — Ambulatory Visit: Payer: Medicare HMO | Admitting: Internal Medicine

## 2023-08-28 ENCOUNTER — Encounter: Payer: Self-pay | Admitting: Internal Medicine

## 2023-08-28 VITALS — BP 123/74 | HR 89 | Ht 62.0 in | Wt 170.2 lb

## 2023-08-28 DIAGNOSIS — R41840 Attention and concentration deficit: Secondary | ICD-10-CM

## 2023-08-28 DIAGNOSIS — G4733 Obstructive sleep apnea (adult) (pediatric): Secondary | ICD-10-CM

## 2023-08-28 NOTE — Patient Instructions (Signed)
Ok to go home and consider options - we talked about weight loss, CPAP-which I recommend for you to start, or a fitted oral appliance. Take your time. You can get back to me when you decide what you would like to do.

## 2023-09-25 ENCOUNTER — Encounter: Payer: Self-pay | Admitting: Internal Medicine

## 2023-09-25 DIAGNOSIS — G4733 Obstructive sleep apnea (adult) (pediatric): Secondary | ICD-10-CM | POA: Insufficient documentation

## 2023-09-25 NOTE — Assessment & Plan Note (Signed)
Moderately severe OSA. Appropriate discussion of conservative and other treatment options. She chooses to go home, consider options and get back to [place] with decisions.

## 2023-09-25 NOTE — Assessment & Plan Note (Signed)
It would be easier to decide if this is a valid separate dx if OSA were treated. Adderall will mask daytime somnolence.

## 2023-10-05 NOTE — Assessment & Plan Note (Signed)
hgba1c acceptable, minimize simple carbs. Increase exercise as tolerated.  

## 2023-10-05 NOTE — Assessment & Plan Note (Signed)
She struggles more with anhedonia and hopelessness. Has agreed to taking the Wellbutrin XL 150 mg daily. Will consider counseling was given a pamphlet for [facility]

## 2023-10-05 NOTE — Assessment & Plan Note (Signed)
Encouraged good sleep hygiene such as dark, quiet room. No blue/green glowing lights such as computer screens in bedroom. No alcohol or stimulants in evening. Cut down on caffeine as able. Regular exercise is helpful but not just prior to bed time.  Tolerating Ambien

## 2023-10-05 NOTE — Assessment & Plan Note (Signed)
Supplement and monitor 

## 2023-10-05 NOTE — Assessment & Plan Note (Signed)
Encourage heart healthy diet such as MIND or DASH diet, increase exercise, avoid trans fats, simple carbohydrates and processed foods, consider a krill or fish or flaxseed oil cap daily.  Tolerating Rosuvastatin 

## 2023-10-05 NOTE — Assessment & Plan Note (Addendum)
Patient encouraged to maintain heart healthy diet, regular exercise, adequate sleep. Consider daily probiotics. Take medications as prescribed. Labs ordered and reviewed. Given and reviewed copy of ACP documents from [facility] Secretary of [place] and encouraged to complete and return  Colonoscopy 2024 [facility] May 2024 Pap Dexa 2024

## 2023-10-05 NOTE — Assessment & Plan Note (Signed)
>>  ASSESSMENT AND PLAN FOR MIXED DIABETIC HYPERLIPIDEMIA ASSOCIATED WITH TYPE 2 DIABETES MELLITUS (HCC) WRITTEN ON 10/05/2023  8:27 PM BY BLYTH, STACEY A, MD  hgba1c acceptable, minimize simple carbs. Increase exercise as tolerated.

## 2023-10-06 ENCOUNTER — Encounter: Payer: Self-pay | Admitting: Family Medicine

## 2023-10-06 ENCOUNTER — Ambulatory Visit (INDEPENDENT_AMBULATORY_CARE_PROVIDER_SITE_OTHER): Payer: Medicare HMO | Admitting: Family Medicine

## 2023-10-06 ENCOUNTER — Other Ambulatory Visit (HOSPITAL_BASED_OUTPATIENT_CLINIC_OR_DEPARTMENT_OTHER): Payer: Self-pay

## 2023-10-06 VITALS — BP 138/86 | HR 84 | Temp 98.5°F | Resp 18 | Ht 62.0 in | Wt 166.4 lb

## 2023-10-06 DIAGNOSIS — E785 Hyperlipidemia, unspecified: Secondary | ICD-10-CM

## 2023-10-06 DIAGNOSIS — G4733 Obstructive sleep apnea (adult) (pediatric): Secondary | ICD-10-CM

## 2023-10-06 DIAGNOSIS — Z79899 Other long term (current) drug therapy: Secondary | ICD-10-CM

## 2023-10-06 DIAGNOSIS — G4700 Insomnia, unspecified: Secondary | ICD-10-CM | POA: Diagnosis not present

## 2023-10-06 DIAGNOSIS — Z23 Encounter for immunization: Secondary | ICD-10-CM

## 2023-10-06 DIAGNOSIS — F418 Other specified anxiety disorders: Secondary | ICD-10-CM

## 2023-10-06 DIAGNOSIS — R739 Hyperglycemia, unspecified: Secondary | ICD-10-CM | POA: Diagnosis not present

## 2023-10-06 DIAGNOSIS — Z0000 Encounter for general adult medical examination without abnormal findings: Secondary | ICD-10-CM

## 2023-10-06 DIAGNOSIS — R7989 Other specified abnormal findings of blood chemistry: Secondary | ICD-10-CM

## 2023-10-06 LAB — CBC WITH DIFFERENTIAL/PLATELET
Basophils Absolute: 0 10*3/uL (ref 0.0–0.1)
Basophils Relative: 0.5 % (ref 0.0–3.0)
Eosinophils Absolute: 0.1 10*3/uL (ref 0.0–0.7)
Eosinophils Relative: 2 % (ref 0.0–5.0)
HCT: 42.2 % (ref 36.0–46.0)
Hemoglobin: 13.8 g/dL (ref 12.0–15.0)
Lymphocytes Relative: 44.4 % (ref 12.0–46.0)
Lymphs Abs: 2 10*3/uL (ref 0.7–4.0)
MCHC: 32.8 g/dL (ref 30.0–36.0)
MCV: 87.8 fL (ref 78.0–100.0)
Monocytes Absolute: 0.3 10*3/uL (ref 0.1–1.0)
Monocytes Relative: 5.8 % (ref 3.0–12.0)
Neutro Abs: 2.1 10*3/uL (ref 1.4–7.7)
Neutrophils Relative %: 47.3 % (ref 43.0–77.0)
Platelets: 213 10*3/uL (ref 150.0–400.0)
RBC: 4.81 Mil/uL (ref 3.87–5.11)
RDW: 14 % (ref 11.5–15.5)
WBC: 4.4 10*3/uL (ref 4.0–10.5)

## 2023-10-06 LAB — COMPREHENSIVE METABOLIC PANEL
ALT: 27 U/L (ref 0–35)
AST: 26 U/L (ref 0–37)
Albumin: 4.9 g/dL (ref 3.5–5.2)
Alkaline Phosphatase: 69 U/L (ref 39–117)
BUN: 21 mg/dL (ref 6–23)
CO2: 32 meq/L (ref 19–32)
Calcium: 9.8 mg/dL (ref 8.4–10.5)
Chloride: 100 meq/L (ref 96–112)
Creatinine, Ser: 0.63 mg/dL (ref 0.40–1.20)
GFR: 88.03 mL/min (ref >60.00)
Glucose, Bld: 103 mg/dL — ABNORMAL HIGH (ref 70–99)
Potassium: 4.9 meq/L (ref 3.5–5.1)
Sodium: 141 meq/L (ref 135–145)
Total Bilirubin: 0.5 mg/dL (ref 0.2–1.2)
Total Protein: 7.5 g/dL (ref 6.0–8.3)

## 2023-10-06 LAB — LIPID PANEL
Cholesterol: 147 mg/dL (ref 0–200)
HDL: 46.2 mg/dL (ref >39.00)
LDL Cholesterol: 66 mg/dL (ref 0–99)
NonHDL: 100.51
Total CHOL/HDL Ratio: 3
Triglycerides: 175 mg/dL — ABNORMAL HIGH (ref 0.0–149.0)
VLDL: 35 mg/dL (ref 0.0–40.0)

## 2023-10-06 LAB — HEMOGLOBIN A1C: Hgb A1c MFr Bld: 6.3 % (ref 4.6–6.5)

## 2023-10-06 LAB — TSH: TSH: 1.41 u[IU]/mL (ref 0.35–5.50)

## 2023-10-06 MED ORDER — AMPHETAMINE-DEXTROAMPHET ER 20 MG PO CP24: 20.0000 mg | ORAL_CAPSULE | ORAL | 0 refills | Status: DC

## 2023-10-06 MED ORDER — AMPHETAMINE-DEXTROAMPHETAMINE 10 MG PO TABS: 10.0000 mg | ORAL_TABLET | Freq: Every day | ORAL | 0 refills | Status: DC | PRN

## 2023-10-06 MED ORDER — SERTRALINE HCL 50 MG PO TABS: 50.0000 mg | ORAL_TABLET | Freq: Every day | ORAL | 3 refills | Status: DC

## 2023-10-06 MED ORDER — AREXVY 120 MCG/0.5ML IM SUSR
0.5000 mL | Freq: Once | INTRAMUSCULAR | 0 refills | Status: AC
  Filled 2023-10-06: qty 0.5, 1d supply, fill #0

## 2023-10-06 NOTE — Assessment & Plan Note (Signed)
Has been diagnosed with apnea but struggling to tolerate the CPAP she is encouraged to use it nightly

## 2023-10-06 NOTE — Patient Instructions (Addendum)
Netflix Live to 100 the [facility]  [facility] [place] and [facility]  Covid booster annually Prevnar 20   RSV, Respiratory Syncitial Virus Vaccine, Arexvy at pharmacy  Preventive Care 65 Years and Older, Female Preventive care refers to lifestyle choices and visits with your health care provider that can promote health and wellness. Preventive care visits are also called wellness exams. What can I expect for my preventive care visit? Counseling Your health care provider may ask you questions about your: Medical history, including: Past medical problems. Family medical history. Pregnancy and menstrual history. History of falls. Current health, including: Memory and ability to understand (cognition). Emotional well-being. Home life and relationship well-being. Sexual activity and sexual health. Lifestyle, including: Alcohol, nicotine or tobacco, and drug use. Access to firearms. Diet, exercise, and sleep habits. Work and work Astronomer. Sunscreen use. Safety issues such as seatbelt and bike helmet use. Physical exam Your health care provider will check your: Height and weight. These may be used to calculate your BMI (body mass index). BMI is a measurement that tells if you are at a healthy weight. Waist circumference. This measures the distance around your waistline. This measurement also tells if you are at a healthy weight and may help predict your risk of certain diseases, such as type 2 diabetes and high blood pressure. Heart rate and blood pressure. Body temperature. Skin for abnormal spots. What immunizations do I need?  Vaccines are usually given at various ages, according to a schedule. Your health care provider will recommend vaccines for you based on your age, medical history, and lifestyle or other factors, such as travel or where you work. What tests do I need? Screening Your health care provider may recommend screening tests for  certain conditions. This may include: Lipid and cholesterol levels. Hepatitis C test. Hepatitis B test. HIV (human immunodeficiency virus) test. STI (sexually transmitted infection) testing, if you are at risk. Lung cancer screening. Colorectal cancer screening. Diabetes screening. This is done by checking your blood sugar (glucose) after you have not eaten for a while (fasting). Mammogram. Talk with your health care provider about how often you should have regular mammograms. BRCA-related cancer screening. This may be done if you have a family history of breast, ovarian, tubal, or peritoneal cancers. Bone density scan. This is done to screen for osteoporosis. Talk with your health care provider about your test results, treatment options, and if necessary, the need for more tests. Follow these instructions at home: Eating and drinking  Eat a diet that includes fresh fruits and vegetables, whole grains, lean protein, and low-fat dairy products. Limit your intake of foods with high amounts of sugar, saturated fats, and salt. Take vitamin and mineral supplements as recommended by your health care provider. Do not drink alcohol if your health care provider tells you not to drink. If you drink alcohol: Limit how much you have to 0-1 drink a day. Know how much alcohol is in your drink. In the [place] one drink equals one 12 oz bottle of beer (355 mL), one 5 oz glass of wine (148 mL), or one 1 oz glass of hard liquor (44 mL). Lifestyle Brush your teeth every morning and night with fluoride toothpaste. Floss one time each day. Exercise for at least 30 minutes 5 or more days each week. Do not use any products that contain nicotine or tobacco. These products include cigarettes, chewing tobacco, and vaping devices, such as e-cigarettes. If you need help quitting,  ask your health care provider. Do not use drugs. If you are sexually active, practice safe sex. Use a condom or other form of protection in  order to prevent STIs. Take aspirin only as told by your health care provider. Make sure that you understand how much to take and what form to take. Work with your health care provider to find out whether it is safe and beneficial for you to take aspirin daily. Ask your health care provider if you need to take a cholesterol-lowering medicine (statin). Find healthy ways to manage stress, such as: Meditation, yoga, or listening to music. Journaling. Talking to a trusted person. Spending time with friends and family. Minimize exposure to UV radiation to reduce your risk of skin cancer. Safety Always wear your seat belt while driving or riding in a vehicle. Do not drive: If you have been drinking alcohol. Do not ride with someone who has been drinking. When you are tired or distracted. While texting. If you have been using any mind-altering substances or drugs. Wear a helmet and other protective equipment during sports activities. If you have firearms in your house, make sure you follow all gun safety procedures. What's next? Visit your health care provider once a year for an annual wellness visit. Ask your health care provider how often you should have your eyes and teeth checked. Stay up to date on all vaccines. This information is not intended to replace advice given to you by your health care provider. Make sure you discuss any questions you have with your health care provider. Document Revised: 04/10/2021 Document Reviewed: 04/10/2021 Elsevier Patient Education  2024 [employer].

## 2023-10-07 NOTE — Progress Notes (Signed)
Subjective:    Patient ID: Tricia Fleming, female    DOB: [birth year], 72 y.o.   MRN: [id number]  Chief Complaint  Patient presents with   Annual Exam    HPI Discussed the use of AI scribe software for clinical note transcription with the patient, who gave verbal consent to proceed.  History of Present Illness   The patient, with a history of sleep apnea, presents with concerns about her current treatment options. She expresses apprehension about using a CPAP machine, citing her father's negative experience with the device. She also mentions potential alternatives such as weight loss, a mouthpiece, and Inspire surgery. Despite not feeling a lack of sleep, the patient reports a persistent lack of energy during the day.  In addition to sleep apnea, the patient also expresses emotional instability, often crying without a clear reason. She is currently on Wellbutrin and is considering switching back to Sertraline, which she has leftover from a previous prescription. She also takes Adderall for an unspecified condition.  The patient also mentions a recent weight loss and subsequent surgery experienced by her brother, which seems to be a cause of concern. She is up-to-date with her colonoscopy, mammogram, and bone density tests, but unsure about her Pap smear status. She is also considering getting a COVID booster shot, a pneumonia shot, and an RSV shot.        Past Medical History:  Diagnosis Date   Anxiety    Atypical chest pain 12/08/2016   Breast lesion 06/18/2010   Qualifier: Diagnosis of  By: Abner Greenspan MD, Esti Demello     Depression    Depression with anxiety 06/27/2011   Hyperglycemia 07/27/2013   Hyperlipidemia    Osteopenia 01/15/2016   Preventative health care 06/27/2011   Preventative health care 06/27/2011   SCC (squamous cell carcinoma), leg 05/04/2014   SCC    Shoulder pain, bilateral 02/13/2013    Past Surgical History:  Procedure Laterality Date   BREAST BIOPSY Left    needle core  biopsy, benign   CHOLECYSTECTOMY     TUBAL LIGATION      Family History  Problem Relation Age of Onset   Diabetes Mother    Hypertension Mother    Coronary artery disease Mother    Cancer Father        metastic prostate   Abdominal Wall Hernia Sister    Hypertension Sister    Alcohol abuse Sister    Memory loss Sister    CVA Sister    Heart disease Sister    Cancer Brother    Heart disease Brother    Bladder Cancer Brother    Prostate cancer Brother    Benign prostatic hyperplasia Brother    Lung disease Brother        in a smoker   AAA (abdominal aortic aneurysm) Brother    Valvular heart disease Brother    Alcohol abuse Brother    Benign prostatic hyperplasia Brother    Alcohol abuse Brother    Esophageal varices Brother    Drug abuse Son        drug overdose   Cancer Maternal Aunt    Birth defects Paternal Aunt        breast   Diabetes Maternal Grandmother    Coronary artery disease Maternal Grandmother    Diabetes Maternal Grandfather     Social History   Socioeconomic History   Marital status: Married    Spouse name: Not on file   Number of children:  3   Years of education: Not on file   Highest education level: Not on file  Occupational History   Occupation: retired  Tobacco Use   Smoking status: Never   Smokeless tobacco: Never  Substance and Sexual Activity   Alcohol use: Yes    Alcohol/week: 0.0 standard drinks of alcohol    Comment: a couple beers a week   Drug use: No   Sexual activity: Yes    Partners: Male  Other Topics Concern   Not on file  Social History Narrative   Married   2 cats, 1 dog   Babysits her grandson   Social Determinants of Health   Financial Resource Strain: Low Risk  (03/31/2023)   Received from [employer], [facility]   Overall Financial Resource Strain (CARDIA)    Difficulty of Paying Living Expenses: Not hard at all  Food Insecurity: No Food Insecurity (03/31/2023)   [facility], [facility]   Hunger Vital Sign    Worried About Running Out of Food in the Last Year: Never true    Ran Out of Food in the Last Year: Never true  Transportation Needs: No Transportation Needs (03/31/2023)   Received from [facility], [facility]   PRAPARE - Transportation    Lack of Transportation (Medical): No    Lack of Transportation (Non-Medical): No  Physical Activity: Unknown (03/31/2023)   Received from [facility], [facility]   Exercise Vital Sign    Days of Exercise per Week: 0 days    Minutes of Exercise per Session: Not on file  Stress: Stress Concern Present (03/31/2023)   Received from [facility], [facility] - Occupational Stress Questionnaire    Feeling of Stress : Rather much  Social Connections: Somewhat Isolated (03/31/2023)   Received from [facility], [facility]   Social Network    How would you rate your social network (family, work, friends)?: Restricted participation with some degree of social isolation  Intimate Partner Violence: Not At Risk (03/31/2023)   Received from [facility], [facility]   HITS    Over the last 12 months how often did your partner physically hurt you?: Never    Over the last 12 months how often did your partner insult you or talk down to you?: Rarely    Over the last 12 months how often did your partner threaten you with physical harm?: Never    Over the last 12 months how often did your partner scream or curse at you?: Rarely    Outpatient Medications Prior to Visit  Medication Sig Dispense Refill   acetaminophen (TYLENOL) 500 MG tablet Take 500 mg by mouth 2 (two) times daily as needed.     ALPRAZolam (XANAX) 1 MG tablet TAKE 1 TABLET BY MOUTH TWICE A DAY AS NEEDED FOR ANXIETY 180 tablet 0   Biotin 5000 MCG CAPS Take by mouth.     buPROPion (WELLBUTRIN XL) 150 MG 24 hr tablet Take 1 tablet (150 mg total) by mouth daily. 90 tablet 0   celecoxib (CELEBREX) 100 MG capsule Take 100  mg by mouth daily.     fish oil-omega-3 fatty acids 1000 MG capsule Take 1,000 mg by mouth daily. 2 caps     levothyroxine (SYNTHROID) 25 MCG tablet Take 1 tablet (25 mcg total) by mouth daily. 90 tablet 3   nitroGLYCERIN (NITROSTAT) 0.4 MG SL tablet Place 1 tablet (0.4 mg total) under the tongue  every 5 (five) minutes as needed for chest pain. 25 tablet 1   rosuvastatin (CRESTOR) 40 MG tablet TAKE 1 TABLET BY MOUTH EVERYDAY AT BEDTIME 90 tablet 1   Turmeric 500 MG CAPS Take 2 capsules by mouth daily.     zolpidem (AMBIEN) 10 MG tablet Take 1 tablet (10 mg total) by mouth at bedtime as needed. for sleep 90 tablet 1   amphetamine-dextroamphetamine (ADDERALL XR) 20 MG 24 hr capsule Take 1 capsule (20 mg total) by mouth every morning. October 2024 30 capsule 0   amphetamine-dextroamphetamine (ADDERALL) 10 MG tablet Take 1 tablet (10 mg total) by mouth daily as needed. November 2024 30 tablet 0   No facility-administered medications prior to visit.    Allergies  Allergen Reactions   Meloxicam Other (See Comments)    "HEADACHE, POOR APPETITE, AND ANXIOUS"    Review of Systems  Constitutional:  Positive for malaise/fatigue. Negative for fever.  HENT:  Negative for congestion.   Eyes:  Negative for blurred vision.  Respiratory:  Negative for shortness of breath.   Cardiovascular:  Negative for chest pain, palpitations and leg swelling.  Gastrointestinal:  Negative for abdominal pain, blood in stool and nausea.  Genitourinary:  Negative for dysuria and frequency.  Musculoskeletal:  Negative for falls.  Skin:  Negative for rash.  Neurological:  Negative for dizziness, loss of consciousness and headaches.  Endo/Heme/Allergies:  Negative for environmental allergies.  Psychiatric/Behavioral:  Negative for depression. The patient is nervous/anxious and has insomnia.        Objective:    Physical Exam Constitutional:      General: She is not in acute distress.    Appearance: Normal  appearance. She is not diaphoretic.  HENT:     Head: Normocephalic and atraumatic.     Right Ear: Tympanic membrane, ear canal and external ear normal.     Left Ear: Tympanic membrane, ear canal and external ear normal.     Nose: Nose normal.     Mouth/Throat:     Mouth: Mucous membranes are moist.     Pharynx: Oropharynx is clear. No oropharyngeal exudate.  Eyes:     General: No scleral icterus.       Right eye: No discharge.        Left eye: No discharge.     Conjunctiva/sclera: Conjunctivae normal.     Pupils: Pupils are equal, round, and reactive to light.  Neck:     Thyroid: No thyromegaly.  Cardiovascular:     Rate and Rhythm: Normal rate and regular rhythm.     Heart sounds: Normal heart sounds. No murmur heard. Pulmonary:     Effort: Pulmonary effort is normal. No respiratory distress.     Breath sounds: Normal breath sounds. No wheezing or rales.  Abdominal:     General: Bowel sounds are normal. There is no distension.     Palpations: Abdomen is soft. There is no mass.     Tenderness: There is no abdominal tenderness.  Musculoskeletal:        General: No tenderness. Normal range of motion.     Cervical back: Normal range of motion and neck supple.  Lymphadenopathy:     Cervical: No cervical adenopathy.  Skin:    General: Skin is warm and dry.     Findings: No rash.  Neurological:     General: No focal deficit present.     Mental Status: She is alert and oriented to person, place, and time.  Cranial Nerves: No cranial nerve deficit.     Coordination: Coordination normal.     Deep Tendon Reflexes: Reflexes are normal and symmetric. Reflexes normal.  Psychiatric:        Mood and Affect: Mood normal.        Behavior: Behavior normal.        Thought Content: Thought content normal.        Judgment: Judgment normal.     BP 138/86 (BP Location: Left Arm, Patient Position: Sitting, Cuff Size: Normal)   Pulse 84   Temp 98.5 F (36.9 C) (Oral)   Resp 18   Ht 5'  2" (1.575 m)   Wt 166 lb 6.4 oz (75.5 kg)   SpO2 95%   BMI 30.43 kg/m  Wt Readings from Last 3 Encounters:  10/06/23 166 lb 6.4 oz (75.5 kg)  08/28/23 170 lb 3.2 oz (77.2 kg)  06/22/23 169 lb (76.7 kg)    Diabetic Foot Exam - Simple   No data filed    Lab Results  Component Value Date   WBC 4.4 10/06/2023   HGB 13.8 10/06/2023   HCT 42.2 10/06/2023   PLT 213.0 10/06/2023   GLUCOSE 103 (H) 10/06/2023   CHOL 147 10/06/2023   TRIG 175.0 (H) 10/06/2023   HDL 46.20 10/06/2023   LDLDIRECT 68.0 03/16/2023   LDLCALC 66 10/06/2023   ALT 27 10/06/2023   AST 26 10/06/2023   NA 141 10/06/2023   K 4.9 10/06/2023   CL 100 10/06/2023   CREATININE 0.63 10/06/2023   BUN 21 10/06/2023   CO2 32 10/06/2023   TSH 1.41 10/06/2023   HGBA1C 6.3 10/06/2023   MICROALBUR <0.7 07/06/2015    Lab Results  Component Value Date   TSH 1.41 10/06/2023   Lab Results  Component Value Date   WBC 4.4 10/06/2023   HGB 13.8 10/06/2023   HCT 42.2 10/06/2023   MCV 87.8 10/06/2023   PLT 213.0 10/06/2023   Lab Results  Component Value Date   NA 141 10/06/2023   K 4.9 10/06/2023   CO2 32 10/06/2023   GLUCOSE 103 (H) 10/06/2023   BUN 21 10/06/2023   CREATININE 0.63 10/06/2023   BILITOT 0.5 10/06/2023   ALKPHOS 69 10/06/2023   AST 26 10/06/2023   ALT 27 10/06/2023   PROT 7.5 10/06/2023   ALBUMIN 4.9 10/06/2023   CALCIUM 9.8 10/06/2023   GFR 88.03 10/06/2023   Lab Results  Component Value Date   CHOL 147 10/06/2023   Lab Results  Component Value Date   HDL 46.20 10/06/2023   Lab Results  Component Value Date   LDLCALC 66 10/06/2023   Lab Results  Component Value Date   TRIG 175.0 (H) 10/06/2023   Lab Results  Component Value Date   CHOLHDL 3 10/06/2023   Lab Results  Component Value Date   HGBA1C 6.3 10/06/2023       Assessment & Plan:  Depression with anxiety Assessment & Plan: She struggles more with anhedonia and hopelessness. Has agreed to taking the Wellbutrin  XL 150 mg daily. Will consider counseling was given a pamphlet for [facility]  Orders: -     Drug Monitoring Panel [phone] , Urine  Hyperglycemia Assessment & Plan: hgba1c acceptable, minimize simple carbs. Increase exercise as tolerated.   Orders: -     Comprehensive metabolic panel -     TSH -     Hemoglobin A1c  Hyperlipidemia, unspecified hyperlipidemia type Assessment & Plan: Encourage heart healthy  diet such as MIND or DASH diet, increase exercise, avoid trans fats, simple carbohydrates and processed foods, consider a krill or fish or flaxseed oil cap daily.  Tolerating Rosuvastatin  Orders: -     Lipid panel -     TSH  Insomnia, unspecified type Assessment & Plan: Encouraged good sleep hygiene such as dark, quiet room. No blue/green glowing lights such as computer screens in bedroom. No alcohol or stimulants in evening. Cut down on caffeine as able. Regular exercise is helpful but not just prior to bed time.  Tolerating Ambien  Orders: -     TSH  Low vitamin B12 level Assessment & Plan: Supplement and monitor   Orders: -     CBC with Differential/Platelet  Preventative health care Assessment & Plan: Patient encouraged to maintain heart healthy diet, regular exercise, adequate sleep. Consider daily probiotics. Take medications as prescribed. Labs ordered and reviewed. Given and reviewed copy of ACP documents from [facility] Secretary of [place] and encouraged to complete and return  Colonoscopy 2024 [facility] May 2024 Pap Dexa 2024   High risk medication use -     Drug Monitoring Panel [phone] , Urine  Need for influenza vaccination -     Flu Vaccine Trivalent High Dose (Fluad)  OSA (obstructive sleep apnea) Assessment & Plan: Has been diagnosed with apnea but struggling to tolerate the CPAP she is encouraged to use it nightly  Orders: -     CBC with Differential/Platelet  Other orders -     Sertraline HCl; Take 1 tablet (50 mg total) by mouth at bedtime.  Dispense: 30  tablet; Refill: 3 -     Amphetamine-Dextroamphet ER; Take 1 capsule (20 mg total) by mouth every morning. December 2024  Dispense: 30 capsule; Refill: 0 -     Amphetamine-Dextroamphetamine; Take 1 tablet (10 mg total) by mouth daily as needed. December 2024  Dispense: 30 tablet; Refill: 0    Assessment and Plan    Sleep Apnea Discussed the benefits of CPAP therapy for sleep apnea and its impact on overall health, including dementia, heart disease, and lung disease. Discussed the potential benefits of cognitive behavioral therapy for insomnia (CBTI) in conjunction with CPAP. -Encouraged trial of CPAP therapy for several months. -Consider CBTI for sleep hygiene and insomnia.  Depression Patient reports crying spells and feeling overwhelmed. Currently on Wellbutrin and considering switching back to Sertraline. -restart low dose Sertraline 50mg  at night. -Continue Wellbutrin 150mg  in the morning. -Plan for a 37-month virtual follow-up to assess medication adjustment.  Preventative Health Discussed the importance of vaccinations and the potential benefits of additional pneumonia vaccination (Prevnar 20) and RSV vaccination. -Administer Prevnar 20 and RSV vaccinations. -Annual COVID-19 booster vaccination recommended. -Plan for a 59-month in-person follow-up with Pap smear.  ADHD Currently on Adderall 20mg  extended release in the morning and 10mg  as needed in the afternoon. No reported side effects. -Continue current regimen of Adderall. -Advised to take the 10mg  dose earlier in the day to avoid potential sleep disturbances.  General Health Maintenance Discussed the importance of physical activity, diet, and sleep for overall health. Discussed the potential risks of COVID-19 and the benefits of vaccination. -Encouraged to increase physical activity to at least 8000 steps per day. -Consider dietary changes to reduce intake of ultra-processed foods. -Encouraged to maintain good sleep hygiene.          Danise Edge, MD

## 2023-10-09 LAB — DRUG MONITORING PANEL 376104, URINE
Alphahydroxyalprazolam: 368 ng/mL — ABNORMAL HIGH (ref <25)
Alphahydroxymidazolam: NEGATIVE ng/mL (ref <50)
Alphahydroxytriazolam: NEGATIVE ng/mL (ref <50)
Aminoclonazepam: NEGATIVE ng/mL (ref <25)
Amphetamine: 2068 ng/mL — ABNORMAL HIGH (ref <250)
Amphetamines: POSITIVE ng/mL — AB (ref <500)
Barbiturates: NEGATIVE ng/mL (ref <300)
Benzodiazepines: POSITIVE ng/mL — AB (ref <100)
Cocaine Metabolite: NEGATIVE ng/mL (ref <150)
Desmethyltramadol: NEGATIVE ng/mL (ref <100)
Hydroxyethylflurazepam: NEGATIVE ng/mL (ref <50)
Lorazepam: NEGATIVE ng/mL (ref <50)
Methamphetamine: NEGATIVE ng/mL (ref <250)
Nordiazepam: NEGATIVE ng/mL (ref <50)
Opiates: NEGATIVE ng/mL (ref <100)
Oxazepam: NEGATIVE ng/mL (ref <50)
Oxycodone: NEGATIVE ng/mL (ref <100)
Temazepam: NEGATIVE ng/mL (ref <50)
Tramadol: NEGATIVE ng/mL (ref <100)

## 2023-10-09 LAB — DM TEMPLATE

## 2023-10-19 ENCOUNTER — Other Ambulatory Visit: Payer: Self-pay | Admitting: Family Medicine

## 2023-10-29 ENCOUNTER — Other Ambulatory Visit: Payer: Self-pay | Admitting: Family Medicine

## 2023-11-09 ENCOUNTER — Telehealth: Payer: Self-pay | Admitting: Family Medicine

## 2023-11-09 ENCOUNTER — Other Ambulatory Visit: Payer: Self-pay | Admitting: Family

## 2023-11-09 MED ORDER — AMPHETAMINE-DEXTROAMPHETAMINE 10 MG PO TABS: 10.0000 mg | ORAL_TABLET | Freq: Every day | ORAL | 0 refills | Status: DC | PRN

## 2023-11-09 MED ORDER — AMPHETAMINE-DEXTROAMPHET ER 20 MG PO CP24: 20.0000 mg | ORAL_CAPSULE | ORAL | 0 refills | Status: DC

## 2023-11-09 NOTE — Telephone Encounter (Unsigned)
 Copied from CRM [id number]. Topic: Clinical - Prescription Issue >> Nov 09, 2023 10:21 AM [name] wrote: Reason for CRM: PT was seen 10/06/23, its showing 0 refills left, offered to schedule an  appt since the medication is amphetamine -dextroamphetamine  (ADDERALL XR) 20 MG 24 hr capsule  amphetamine -dextroamphetamine  (ADDERALL) 10 MG tablet, pt stated she was just seen. She has a few tablets left.  Callback [phone]

## 2023-11-09 NOTE — Telephone Encounter (Signed)
 Copied from CRM [id number]. Topic: Clinical - Medication Refill >> Nov 09, 2023 10:19 AM [name] wrote: Most Recent Primary Care Visit:  Provider: DOMENICA BLACKBIRD A  Department: [facility]  Visit Type: PHYSICAL  Date: 10/06/2023  Medication: amphetamine -dextroamphetamine  (ADDERALL XR) 20 MG 24 hr capsule  amphetamine -dextroamphetamine  (ADDERALL) 10 MG tablet   Has the patient contacted their pharmacy? Yes (Agent: If no, request that the patient contact the pharmacy for the refill. If patient does not wish to contact the pharmacy document the reason why and proceed with request.) (Agent: If yes, when and what did the pharmacy advise?)  Is this the correct pharmacy for this prescription? Yes If no, delete pharmacy and type the correct one.  This is the patient's preferred pharmacy:  [id number] - [place], [place] - [address] AT ACROSS FROM [facility] [address] [place] [address] Phone: [phone] Fax: [phone]   Has the prescription been filled recently? Yes  Is the patient out of the medication? Just a few left   Has the patient been seen for an appointment in the last year OR does the patient have an upcoming appointment? Yes  Can we respond through MyChart? Yes  Agent: Please be advised that Rx refills may take up to 3 business days. We ask that you follow-up with your pharmacy.

## 2023-12-01 ENCOUNTER — Telehealth: Payer: Self-pay | Admitting: Family Medicine

## 2023-12-01 NOTE — Telephone Encounter (Signed)
 Copied from CRM [id number]. Topic: Clinical - Medication Refill >> Dec 01, 2023  4:06 PM Burnard DEL wrote: Most Recent Primary Care Visit:  Provider: DOMENICA BLACKBIRD A  Department: [facility]  Visit Type: PHYSICAL  Date: 10/06/2023  Medication:  zolpidem  (AMBIEN ) 10 MG tablet  ALPRAZolam  (XANAX ) 1 MG tablet  Has the patient contacted their pharmacy? Yes (Agent: If no, request that the patient contact the pharmacy for the refill. If patient does not wish to contact the pharmacy document the reason why and proceed with request.) (Agent: If yes, when and what did the pharmacy advise?)  Is this the correct pharmacy for this prescription? Yes If no, delete pharmacy and type the correct one.  This is the patient's preferred pharmacy:  [id number] [place], [place] - [address] AT ACROSS FROM [facility] [address] [place] [address] Phone: [phone] Fax: [phone]   Has the prescription been filled recently? No  Is the patient out of the medication? No  Has the patient been seen for an appointment in the last year OR does the patient have an upcoming appointment? Yes  Can we respond through MyChart? Yes  Agent: Please be advised that Rx refills may take up to 3 business days. We ask that you follow-up with your pharmacy.

## 2023-12-02 ENCOUNTER — Other Ambulatory Visit: Payer: Self-pay | Admitting: Family

## 2023-12-02 DIAGNOSIS — M8580 Other specified disorders of bone density and structure, unspecified site: Secondary | ICD-10-CM

## 2023-12-02 DIAGNOSIS — R739 Hyperglycemia, unspecified: Secondary | ICD-10-CM

## 2023-12-02 DIAGNOSIS — E782 Mixed hyperlipidemia: Secondary | ICD-10-CM

## 2023-12-02 DIAGNOSIS — Z23 Encounter for immunization: Secondary | ICD-10-CM

## 2023-12-02 DIAGNOSIS — R030 Elevated blood-pressure reading, without diagnosis of hypertension: Secondary | ICD-10-CM

## 2023-12-02 MED ORDER — ZOLPIDEM TARTRATE 10 MG PO TABS: 10.0000 mg | ORAL_TABLET | Freq: Every evening | ORAL | 1 refills | Status: DC | PRN

## 2023-12-02 MED ORDER — ALPRAZOLAM 1 MG PO TABS: ORAL_TABLET | ORAL | 0 refills | Status: DC

## 2023-12-17 ENCOUNTER — Other Ambulatory Visit: Payer: Self-pay | Admitting: Family Medicine

## 2023-12-17 NOTE — Telephone Encounter (Signed)
Copied from CRM [id number]. Topic: Clinical - Medication Refill >> Dec 17, 2023 11:41 AM Isabell A wrote: Most Recent Primary Care Visit:  Provider: Danise Edge A  Department: [facility]  Visit Type: PHYSICAL  Date: 10/06/2023  Medication: amphetamine-dextroamphetamine (ADDERALL XR) 20 MG 24 hr capsule  amphetamine-dextroamphetamine (ADDERALL) 10 MG tablet  Has the patient contacted their pharmacy? No (Agent: If no, request that the patient contact the pharmacy for the refill. If patient does not wish to contact the pharmacy document the reason why and proceed with request.) (Agent: If yes, when and what did the pharmacy advise?)  Is this the correct pharmacy for this prescription? Yes If no, delete pharmacy and type the correct one.  This is the patient's preferred pharmacy:  [id number] - [place], [place] - [address] AT ACROSS FROM [facility] [address] [place] [address] Phone: [phone] Fax: [phone]   Has the prescription been filled recently? Yes  Is the patient out of the medication? Yes  Has the patient been seen for an appointment in the last year OR does the patient have an upcoming appointment? Yes  Can we respond through MyChart? No  Agent: Please be advised that Rx refills may take up to 3 business days. We ask that you follow-up with your pharmacy.

## 2023-12-18 MED ORDER — AMPHETAMINE-DEXTROAMPHET ER 20 MG PO CP24: 20.0000 mg | ORAL_CAPSULE | ORAL | 0 refills | Status: DC

## 2023-12-18 MED ORDER — AMPHETAMINE-DEXTROAMPHETAMINE 10 MG PO TABS: 10.0000 mg | ORAL_TABLET | Freq: Every day | ORAL | 0 refills | Status: DC | PRN

## 2023-12-18 NOTE — Telephone Encounter (Signed)
Requesting: Adderall XR 20mg  and Adderall 10mg   Contract: 04/07/23 UDS: 10/06/23 Last Visit: 10/06/23 Next Visit: None Last Refill: see med list   Please Advise

## 2024-01-04 NOTE — Assessment & Plan Note (Signed)
 Hydrate and monitor

## 2024-01-04 NOTE — Assessment & Plan Note (Signed)
 hgba1c acceptable, minimize simple carbs. Increase exercise as tolerated.

## 2024-01-04 NOTE — Assessment & Plan Note (Signed)
 Stable on current meds

## 2024-01-04 NOTE — Assessment & Plan Note (Signed)
 Encourage heart healthy diet such as MIND or DASH diet, increase exercise, avoid trans fats, simple carbohydrates and processed foods, consider a krill or fish or flaxseed oil cap daily. Tolerating Rosuvastatin

## 2024-01-04 NOTE — Assessment & Plan Note (Signed)
>>  ASSESSMENT AND PLAN FOR MIXED DIABETIC HYPERLIPIDEMIA ASSOCIATED WITH TYPE 2 DIABETES MELLITUS (HCC) WRITTEN ON 01/05/2024  9:25 AM BY BLYTH, STACEY A, MD  hgba1c acceptable, minimize simple carbs. Increase exercise as tolerated. She is given a prescription

## 2024-01-05 ENCOUNTER — Encounter: Payer: Self-pay | Admitting: Family Medicine

## 2024-01-05 ENCOUNTER — Telehealth: Payer: Medicare HMO | Admitting: Family Medicine

## 2024-01-05 DIAGNOSIS — G4700 Insomnia, unspecified: Secondary | ICD-10-CM

## 2024-01-05 DIAGNOSIS — E785 Hyperlipidemia, unspecified: Secondary | ICD-10-CM | POA: Diagnosis not present

## 2024-01-05 DIAGNOSIS — R7989 Other specified abnormal findings of blood chemistry: Secondary | ICD-10-CM | POA: Diagnosis not present

## 2024-01-05 DIAGNOSIS — E782 Mixed hyperlipidemia: Secondary | ICD-10-CM | POA: Diagnosis not present

## 2024-01-05 DIAGNOSIS — R41840 Attention and concentration deficit: Secondary | ICD-10-CM | POA: Diagnosis not present

## 2024-01-05 DIAGNOSIS — F418 Other specified anxiety disorders: Secondary | ICD-10-CM | POA: Diagnosis not present

## 2024-01-05 DIAGNOSIS — E1169 Type 2 diabetes mellitus with other specified complication: Secondary | ICD-10-CM | POA: Diagnosis not present

## 2024-01-05 DIAGNOSIS — R739 Hyperglycemia, unspecified: Secondary | ICD-10-CM

## 2024-01-05 DIAGNOSIS — G4733 Obstructive sleep apnea (adult) (pediatric): Secondary | ICD-10-CM | POA: Diagnosis not present

## 2024-01-05 MED ORDER — ONETOUCH DELICA LANCETS 33G MISC: 1 refills | Status: AC

## 2024-01-05 MED ORDER — ONETOUCH VERIO FLEX SYSTEM W/DEVICE KIT: PACK | 0 refills | Status: AC

## 2024-01-05 MED ORDER — ONETOUCH VERIO VI STRP: ORAL_STRIP | 1 refills | Status: AC

## 2024-01-05 NOTE — Assessment & Plan Note (Signed)
 Encouraged good sleep hygiene such as dark, quiet room. No blue/green glowing lights such as computer screens in bedroom. No alcohol or stimulants in evening. Cut down on caffeine as able. Regular exercise is helpful but not just prior to bed time.  She struggles with feeling achy at night. Once she gets up and gets moving the next day she is feeling some better

## 2024-01-05 NOTE — Progress Notes (Signed)
 MyChart Video Visit    Virtual Visit via Video Note   This patient is at least at moderate risk for complications without adequate follow up. This format is felt to be most appropriate for this patient at this time. Physical exam was limited by quality of the video and audio technology used for the visit. Juanetta, CMA was able to get the patient set up on a video visit.  Patient location: home Patient and provider in visit Provider location: Office  I discussed the limitations of evaluation and management by telemedicine and the availability of in person appointments. The patient expressed understanding and agreed to proceed.  Visit Date: 01/05/2024  Today's healthcare provider: Danise Edge, MD  Subjective:    Patient ID: Tricia Fleming, female    DOB: [birth year], 73 y.o.   MRN: [id number]  Chief Complaint  Patient presents with   Follow-up    HPI Discussed the use of AI scribe software for clinical note transcription with the patient, who gave verbal consent to proceed.  History of Present Illness The patient presents for a routine follow-up visit.  She experiences body aches at night, particularly affecting her knees and hips, which feel as though she might be 'coming out of her sockets sideways.' These symptoms improve once she gets moving in the morning. She takes zolpidem, using half a pill some nights and a full pill on others, to help with sleep.  Her hemoglobin A1c was previously recorded at 6.5, indicating early diabetes, but recent levels have been 6.3. She has not noticed any significant changes in her diet or exercise routine, although she has been engaging in more enjoyable activities like furniture stripping and painting.  She mentions a history of mild sleep apnea, with a previous test result of 29, just below the threshold for CPAP eligibility. She has not experienced noticeable symptoms at night, although her husband notes snoring.  She continues to take  Adderall 20 mg daily and occasionally an additional 10 mg as needed to maintain energy levels. She reports no significant side effects from her medications, including Adderall, Wellbutrin, sertraline, and alprazolam, which she takes as needed, usually averaging one pill per day.    Past Medical History:  Diagnosis Date   Anxiety    Atypical chest pain 12/08/2016   Breast lesion 06/18/2010   Qualifier: Diagnosis of  By: Abner Greenspan MD, Jordynne Mccown     Depression    Depression with anxiety 06/27/2011   Hyperglycemia 07/27/2013   Hyperlipidemia    Osteopenia 01/15/2016   Preventative health care 06/27/2011   Preventative health care 06/27/2011   SCC (squamous cell carcinoma), leg 05/04/2014   SCC    Shoulder pain, bilateral 02/13/2013    Past Surgical History:  Procedure Laterality Date   BREAST BIOPSY Left    needle core biopsy, benign   CHOLECYSTECTOMY     TUBAL LIGATION      Family History  Problem Relation Age of Onset   Diabetes Mother    Hypertension Mother    Coronary artery disease Mother    Cancer Father        metastic prostate   Abdominal Wall Hernia Sister    Hypertension Sister    Alcohol abuse Sister    Memory loss Sister    CVA Sister    Heart disease Sister    Cancer Brother    Heart disease Brother    Bladder Cancer Brother    Prostate cancer Brother    Benign prostatic hyperplasia Brother  Lung disease Brother        in a smoker   AAA (abdominal aortic aneurysm) Brother    Valvular heart disease Brother    Alcohol abuse Brother    Benign prostatic hyperplasia Brother    Alcohol abuse Brother    Esophageal varices Brother    Drug abuse Son        drug overdose   Cancer Maternal Aunt    Birth defects Paternal Aunt        breast   Diabetes Maternal Grandmother    Coronary artery disease Maternal Grandmother    Diabetes Maternal Grandfather     Social History   Socioeconomic History   Marital status: Married    Spouse name: Not on file   Number of  children: 3   Years of education: Not on file   Highest education level: Not on file  Occupational History   Occupation: retired  Tobacco Use   Smoking status: Never   Smokeless tobacco: Never  Substance and Sexual Activity   Alcohol use: Yes    Alcohol/week: 0.0 standard drinks of alcohol    Comment: a couple beers a week   Drug use: No   Sexual activity: Yes    Partners: Male  Other Topics Concern   Not on file  Social History Narrative   Married   2 cats, 1 dog   Babysits her grandson   Social Drivers of Corporate investment banker Strain: Low Risk  (03/31/2023)   Received from [employer], [facility]   Overall Financial Resource Strain (CARDIA)    Difficulty of Paying Living Expenses: Not hard at all  Food Insecurity: No Food Insecurity (03/31/2023)   Received from [facility], [facility]   Hunger Vital Sign    Worried About Running Out of Food in the Last Year: Never true    Ran Out of Food in the Last Year: Never true  Transportation Needs: No Transportation Needs (03/31/2023)   Received from [facility], [facility]   PRAPARE - Transportation    Lack of Transportation (Medical): No    Lack of Transportation (Non-Medical): No  Physical Activity: Unknown (03/31/2023)   Received from [facility], [facility]   Exercise Vital Sign    Days of Exercise per Week: 0 days    Minutes of Exercise per Session: Not on file  Stress: Stress Concern Present (03/31/2023)   Received from [facility], [facility] - Occupational Stress Questionnaire    Feeling of Stress : Rather much  Social Connections: Somewhat Isolated (03/31/2023)   Received from [facility], [facility]   Social Network    How would you rate your social network (family, work, friends)?: Restricted participation with some degree of social isolation  Intimate Partner Violence: Not At Risk (03/31/2023)   Received from [facility], [facility]   HITS     Over the last 12 months how often did your partner physically hurt you?: Never    Over the last 12 months how often did your partner insult you or talk down to you?: Rarely    Over the last 12 months how often did your partner threaten you with physical harm?: Never    Over the last 12 months how often did your partner scream or curse at you?: Rarely    Outpatient Medications Prior to Visit  Medication Sig Dispense Refill   acetaminophen (TYLENOL) 500 MG tablet Take 500 mg by  mouth 2 (two) times daily as needed.     ALPRAZolam (XANAX) 1 MG tablet TAKE 1 TABLET BY MOUTH TWICE A DAY AS NEEDED FOR ANXIETY 180 tablet 0   amphetamine-dextroamphetamine (ADDERALL XR) 20 MG 24 hr capsule Take 1 capsule (20 mg total) by mouth every morning. 30 capsule 0   amphetamine-dextroamphetamine (ADDERALL) 10 MG tablet Take 1 tablet (10 mg total) by mouth daily as needed. 30 tablet 0   Biotin 5000 MCG CAPS Take by mouth.     buPROPion (WELLBUTRIN XL) 150 MG 24 hr tablet Take 1 tablet (150 mg total) by mouth daily. 90 tablet 1   celecoxib (CELEBREX) 100 MG capsule Take 100 mg by mouth daily.     fish oil-omega-3 fatty acids 1000 MG capsule Take 1,000 mg by mouth daily. 2 caps     levothyroxine (SYNTHROID) 25 MCG tablet Take 1 tablet (25 mcg total) by mouth daily. 90 tablet 3   nitroGLYCERIN (NITROSTAT) 0.4 MG SL tablet Place 1 tablet (0.4 mg total) under the tongue every 5 (five) minutes as needed for chest pain. 25 tablet 1   rosuvastatin (CRESTOR) 40 MG tablet TAKE 1 TABLET BY MOUTH EVERYDAY AT BEDTIME 90 tablet 1   sertraline (ZOLOFT) 50 MG tablet Take 1 tablet (50 mg total) by mouth at bedtime. 90 tablet 0   Turmeric 500 MG CAPS Take 2 capsules by mouth daily.     zolpidem (AMBIEN) 10 MG tablet Take 1 tablet (10 mg total) by mouth at bedtime as needed. for sleep 90 tablet 1   No facility-administered medications prior to visit.    Allergies  Allergen Reactions   Meloxicam Other (See Comments)     "HEADACHE, POOR APPETITE, AND ANXIOUS"    Review of Systems  Constitutional:  Negative for fever and malaise/fatigue.  HENT:  Negative for congestion.   Eyes:  Negative for blurred vision.  Respiratory:  Negative for shortness of breath.   Cardiovascular:  Negative for chest pain, palpitations and leg swelling.  Gastrointestinal:  Negative for abdominal pain, blood in stool and nausea.  Genitourinary:  Negative for dysuria and frequency.  Musculoskeletal:  Negative for falls.  Skin:  Negative for rash.  Neurological:  Negative for dizziness, loss of consciousness and headaches.  Endo/Heme/Allergies:  Negative for environmental allergies.  Psychiatric/Behavioral:  Negative for depression. The patient is nervous/anxious and has insomnia.        Objective:    Physical Exam Constitutional:      General: She is not in acute distress.    Appearance: Normal appearance. She is not ill-appearing or toxic-appearing.  HENT:     Head: Normocephalic and atraumatic.     Right Ear: External ear normal.     Left Ear: External ear normal.     Nose: Nose normal.  Eyes:     General:        Right eye: No discharge.        Left eye: No discharge.  Pulmonary:     Effort: Pulmonary effort is normal.  Skin:    Findings: No rash.  Neurological:     Mental Status: She is alert and oriented to person, place, and time.  Psychiatric:        Behavior: Behavior normal.     There were no vitals taken for this visit. Wt Readings from Last 3 Encounters:  10/06/23 166 lb 6.4 oz (75.5 kg)  08/28/23 170 lb 3.2 oz (77.2 kg)  06/22/23 169 lb (76.7 kg)  Diabetic Foot Exam - Simple   No data filed    Lab Results  Component Value Date   WBC 4.4 10/06/2023   HGB 13.8 10/06/2023   HCT 42.2 10/06/2023   PLT 213.0 10/06/2023   GLUCOSE 103 (H) 10/06/2023   CHOL 147 10/06/2023   TRIG 175.0 (H) 10/06/2023   HDL 46.20 10/06/2023   LDLDIRECT 68.0 03/16/2023   LDLCALC 66 10/06/2023   ALT 27  10/06/2023   AST 26 10/06/2023   NA 141 10/06/2023   K 4.9 10/06/2023   CL 100 10/06/2023   CREATININE 0.63 10/06/2023   BUN 21 10/06/2023   CO2 32 10/06/2023   TSH 1.41 10/06/2023   HGBA1C 6.3 10/06/2023   MICROALBUR <0.7 07/06/2015    Lab Results  Component Value Date   TSH 1.41 10/06/2023   Lab Results  Component Value Date   WBC 4.4 10/06/2023   HGB 13.8 10/06/2023   HCT 42.2 10/06/2023   MCV 87.8 10/06/2023   PLT 213.0 10/06/2023   Lab Results  Component Value Date   NA 141 10/06/2023   K 4.9 10/06/2023   CO2 32 10/06/2023   GLUCOSE 103 (H) 10/06/2023   BUN 21 10/06/2023   CREATININE 0.63 10/06/2023   BILITOT 0.5 10/06/2023   ALKPHOS 69 10/06/2023   AST 26 10/06/2023   ALT 27 10/06/2023   PROT 7.5 10/06/2023   ALBUMIN 4.9 10/06/2023   CALCIUM 9.8 10/06/2023   GFR 88.03 10/06/2023   Lab Results  Component Value Date   CHOL 147 10/06/2023   Lab Results  Component Value Date   HDL 46.20 10/06/2023   Lab Results  Component Value Date   LDLCALC 66 10/06/2023   Lab Results  Component Value Date   TRIG 175.0 (H) 10/06/2023   Lab Results  Component Value Date   CHOLHDL 3 10/06/2023   Lab Results  Component Value Date   HGBA1C 6.3 10/06/2023       Assessment & Plan:  Attention deficit Assessment & Plan: Stable on current meds   Depression with anxiety Assessment & Plan: Stable on current meds   Mixed diabetic hyperlipidemia associated with type 2 diabetes mellitus (HCC) Assessment & Plan: hgba1c acceptable, minimize simple carbs. Increase exercise as tolerated. She is given a prescription   Orders: -     Microalbumin / creatinine urine ratio; Future  Hyperlipidemia, unspecified hyperlipidemia type Assessment & Plan: Encourage heart healthy diet such as MIND or DASH diet, increase exercise, avoid trans fats, simple carbohydrates and processed foods, consider a krill or fish or flaxseed oil cap daily.  Tolerating  Rosuvastatin  Orders: -     Lipid panel; Future -     Comprehensive metabolic panel; Future -     TSH; Future  Low vitamin B12 level Assessment & Plan: Hydrate and monitor   Orders: -     CBC with Differential/Platelet; Future -     Vitamin B12; Future  Insomnia, unspecified type Assessment & Plan: Encouraged good sleep hygiene such as dark, quiet room. No blue/green glowing lights such as computer screens in bedroom. No alcohol or stimulants in evening. Cut down on caffeine as able. Regular exercise is helpful but not just prior to bed time.  She struggles with feeling achy at night. Once she gets up and gets moving the next day she is feeling some better   Hyperlipidemia associated with type 2 diabetes mellitus (HCC) -     Comprehensive metabolic panel; Future -  Hemoglobin A1c; Future -     TSH; Future  Obstructive sleep apnea syndrome, mild Assessment & Plan: She is working with pulmonary and is considering CPAP vs mouth piece     Assessment and Plan Assessment & Plan Generalized Body Aches Nocturnal body aches, especially in knees and hips, relieved by movement. Zolpidem aids sleep initiation without significant side effects. - Continue zolpidem as needed for sleep.  Obstructive Sleep Apnea Mild obstructive sleep apnea with an index of 29. Discussed CPAP versus mouthpiece. Highlighted risks of untreated sleep apnea. - Encourage trial of CPAP or mouthpiece based on preference. - Contact Dr. Maple Hudson for further guidance on treatment options.  Hyperglycemia Previous hemoglobin A1c levels indicate diet-controlled diabetes. Emphasized blood glucose monitoring to prevent progression. Explained hemoglobin A1c thresholds. - Order blood work for hemoglobin A1c and triglycerides before March 22. - Set up glucometer and provide test strips and lancets. - Arrange nurse visit for glucometer education.     Danise Edge, MD

## 2024-01-05 NOTE — Assessment & Plan Note (Signed)
 She is working with pulmonary and is considering CPAP vs mouth piece

## 2024-01-06 ENCOUNTER — Encounter: Payer: Self-pay | Admitting: Family Medicine

## 2024-01-08 ENCOUNTER — Ambulatory Visit

## 2024-01-11 ENCOUNTER — Encounter: Payer: Self-pay | Admitting: Family Medicine

## 2024-01-11 ENCOUNTER — Other Ambulatory Visit (INDEPENDENT_AMBULATORY_CARE_PROVIDER_SITE_OTHER)

## 2024-01-11 ENCOUNTER — Other Ambulatory Visit

## 2024-01-11 DIAGNOSIS — E782 Mixed hyperlipidemia: Secondary | ICD-10-CM

## 2024-01-11 DIAGNOSIS — E785 Hyperlipidemia, unspecified: Secondary | ICD-10-CM | POA: Diagnosis not present

## 2024-01-11 DIAGNOSIS — R7989 Other specified abnormal findings of blood chemistry: Secondary | ICD-10-CM

## 2024-01-11 DIAGNOSIS — E1169 Type 2 diabetes mellitus with other specified complication: Secondary | ICD-10-CM | POA: Diagnosis not present

## 2024-01-11 LAB — COMPREHENSIVE METABOLIC PANEL
ALT: 27 U/L (ref 0–35)
AST: 31 U/L (ref 0–37)
Albumin: 4.8 g/dL (ref 3.5–5.2)
Alkaline Phosphatase: 72 U/L (ref 39–117)
BUN: 15 mg/dL (ref 6–23)
CO2: 31 meq/L (ref 19–32)
Calcium: 9.9 mg/dL (ref 8.4–10.5)
Chloride: 100 meq/L (ref 96–112)
Creatinine, Ser: 0.65 mg/dL (ref 0.40–1.20)
GFR: 87.21 mL/min (ref >60.00)
Glucose, Bld: 117 mg/dL — ABNORMAL HIGH (ref 70–99)
Potassium: 4.8 meq/L (ref 3.5–5.1)
Sodium: 140 meq/L (ref 135–145)
Total Bilirubin: 0.4 mg/dL (ref 0.2–1.2)
Total Protein: 7.6 g/dL (ref 6.0–8.3)

## 2024-01-11 LAB — CBC WITH DIFFERENTIAL/PLATELET
Basophils Absolute: 0 10*3/uL (ref 0.0–0.1)
Basophils Relative: 0.7 % (ref 0.0–3.0)
Eosinophils Absolute: 0.1 10*3/uL (ref 0.0–0.7)
Eosinophils Relative: 1.6 % (ref 0.0–5.0)
HCT: 40.2 % (ref 36.0–46.0)
Hemoglobin: 13.3 g/dL (ref 12.0–15.0)
Lymphocytes Relative: 25.8 % (ref 12.0–46.0)
Lymphs Abs: 1.9 10*3/uL (ref 0.7–4.0)
MCHC: 33.1 g/dL (ref 30.0–36.0)
MCV: 87.1 fl (ref 78.0–100.0)
Monocytes Absolute: 0.4 10*3/uL (ref 0.1–1.0)
Monocytes Relative: 5.3 % (ref 3.0–12.0)
Neutro Abs: 4.8 10*3/uL (ref 1.4–7.7)
Neutrophils Relative %: 66.6 % (ref 43.0–77.0)
Platelets: 235 10*3/uL (ref 150.0–400.0)
RBC: 4.61 Mil/uL (ref 3.87–5.11)
RDW: 14 % (ref 11.5–15.5)
WBC: 7.2 10*3/uL (ref 4.0–10.5)

## 2024-01-11 LAB — LIPID PANEL
Cholesterol: 131 mg/dL (ref 0–200)
HDL: 42.5 mg/dL (ref >39.00)
LDL Cholesterol: 59 mg/dL (ref 0–99)
NonHDL: 88.07
Total CHOL/HDL Ratio: 3
Triglycerides: 147 mg/dL (ref 0.0–149.0)
VLDL: 29.4 mg/dL (ref 0.0–40.0)

## 2024-01-11 LAB — TSH: TSH: 1.63 u[IU]/mL (ref 0.35–5.50)

## 2024-01-11 LAB — MICROALBUMIN / CREATININE URINE RATIO
Creatinine,U: 110.8 mg/dL
Microalb Creat Ratio: 9.6 mg/g (ref 0.0–30.0)
Microalb, Ur: 1.1 mg/dL (ref 0.0–1.9)

## 2024-01-11 LAB — HEMOGLOBIN A1C: Hgb A1c MFr Bld: 6.4 % (ref 4.6–6.5)

## 2024-01-11 LAB — VITAMIN B12: Vitamin B-12: 477 pg/mL (ref 211–911)

## 2024-01-12 ENCOUNTER — Other Ambulatory Visit

## 2024-01-26 ENCOUNTER — Other Ambulatory Visit: Payer: Self-pay | Admitting: Family Medicine

## 2024-01-26 MED ORDER — AMPHETAMINE-DEXTROAMPHET ER 20 MG PO CP24: 20.0000 mg | ORAL_CAPSULE | ORAL | 0 refills | Status: DC

## 2024-01-26 MED ORDER — ZOLPIDEM TARTRATE 10 MG PO TABS
10.0000 mg | ORAL_TABLET | Freq: Every evening | ORAL | 1 refills | Status: DC | PRN
Start: 2024-01-26 — End: 2024-05-05

## 2024-01-26 MED ORDER — AMPHETAMINE-DEXTROAMPHETAMINE 10 MG PO TABS: 10.0000 mg | ORAL_TABLET | Freq: Every day | ORAL | 0 refills | Status: DC | PRN

## 2024-01-26 NOTE — Telephone Encounter (Signed)
 Copied from CRM [id number]. Topic: Clinical - Medication Refill >> Jan 26, 2024 11:24 AM Azucena Fallen wrote: Most Recent Primary Care Visit:  Provider: [facility]  Department: [facility]  Visit Type: LAB VISIT  Date: 01/11/2024  Medication:  amphetamine-dextroamphetamine (ADDERALL) 10 MG tablet amphetamine-dextroamphetamine (ADDERALL XR) 20 MG 24 hr capsule zolpidem (AMBIEN) 10 MG tablet   Has the patient contacted their pharmacy? No (Agent: If no, request that the patient contact the pharmacy for the refill. If patient does not wish to contact the pharmacy document the reason why and proceed with request.) (Agent: If yes, when and what did the pharmacy advise?)  Is this the correct pharmacy for this prescription? Yes If no, delete pharmacy and type the correct one.  This is the patient's preferred pharmacy:  [id number] [place], [place] - [address] AT ACROSS FROM [facility] [address] [place] [place] [address] Phone: [phone] Fax: [phone]   Has the prescription been filled recently? No  Is the patient out of the medication? Yes  Has the patient been seen for an appointment in the last year OR does the patient have an upcoming appointment? Yes  Can we respond through MyChart? Yes  Agent: Please be advised that Rx refills may take up to 3 business days. We ask that you follow-up with your pharmacy.

## 2024-01-26 NOTE — Telephone Encounter (Signed)
 Requesting: Adderall XR 20mg , Adderall 10mg , Ambien 10mg   Contract: 04/07/23 UDS:10/06/23 Last Visit: 01/05/24 Next Visit: 04/18/24 Last Refill: see med list   Please Advise

## 2024-01-26 NOTE — Telephone Encounter (Signed)
 Last Fill: Adderall: 12/18/23 x2 30 tabs/0 RF     Ambien: 12/02/23 90 tabs/1 RF  Last OV: 01/05/24 Next OV: 04/18/24  Routing to provider for review/authorization.

## 2024-01-27 ENCOUNTER — Telehealth: Payer: Self-pay | Admitting: Emergency Medicine

## 2024-01-27 NOTE — Telephone Encounter (Signed)
 Copied from CRM [id number]. Topic: Clinical - Medication Question >> Jan 26, 2024 11:27 AM Cammy Copa D wrote: Reason for CRM: PT had an appt for a nurse to come to her home and teach her how to use her Glucose Monitor, but Mrs. Uhrich had to cancel this appt due to being sick. She stated that she could read the instructions and try to figure it out but I advised her that I could see if a nurse could possibly reach out to her regarding the directions to use this device.

## 2024-01-27 NOTE — Telephone Encounter (Signed)
 Called and spoke with patient. She said she wanted to try to see if she could do it herself first. She said if she had a problems, she would call us back.

## 2024-02-05 ENCOUNTER — Other Ambulatory Visit: Payer: Self-pay | Admitting: Family Medicine

## 2024-02-16 DIAGNOSIS — L905 Scar conditions and fibrosis of skin: Secondary | ICD-10-CM | POA: Diagnosis not present

## 2024-02-16 DIAGNOSIS — D225 Melanocytic nevi of trunk: Secondary | ICD-10-CM | POA: Diagnosis not present

## 2024-02-16 DIAGNOSIS — I781 Nevus, non-neoplastic: Secondary | ICD-10-CM | POA: Diagnosis not present

## 2024-02-16 DIAGNOSIS — Z85828 Personal history of other malignant neoplasm of skin: Secondary | ICD-10-CM | POA: Diagnosis not present

## 2024-02-16 DIAGNOSIS — L570 Actinic keratosis: Secondary | ICD-10-CM | POA: Diagnosis not present

## 2024-02-16 DIAGNOSIS — B078 Other viral warts: Secondary | ICD-10-CM | POA: Diagnosis not present

## 2024-02-16 DIAGNOSIS — L821 Other seborrheic keratosis: Secondary | ICD-10-CM | POA: Diagnosis not present

## 2024-03-01 ENCOUNTER — Other Ambulatory Visit: Payer: Self-pay | Admitting: Family Medicine

## 2024-03-01 DIAGNOSIS — Z23 Encounter for immunization: Secondary | ICD-10-CM

## 2024-03-01 DIAGNOSIS — R739 Hyperglycemia, unspecified: Secondary | ICD-10-CM

## 2024-03-01 DIAGNOSIS — M8580 Other specified disorders of bone density and structure, unspecified site: Secondary | ICD-10-CM

## 2024-03-01 DIAGNOSIS — R030 Elevated blood-pressure reading, without diagnosis of hypertension: Secondary | ICD-10-CM

## 2024-03-01 DIAGNOSIS — E782 Mixed hyperlipidemia: Secondary | ICD-10-CM

## 2024-03-01 NOTE — Telephone Encounter (Signed)
 Copied from CRM [id number]. Topic: Clinical - Medication Refill >> Mar 01, 2024 12:46 PM Chuck Crater wrote: Most Recent Primary Care Visit:  Provider: [facility]  [facility]  Visit Type: LAB VISIT  Date: 01/11/2024  Medication: ALPRAZolam  (XANAX ) 1 MG tablet, amphetamine -dextroamphetamine  (ADDERALL XR) 20 MG 24 hr capsule, amphetamine -dextroamphetamine  (ADDERALL) 10 MG tablet   Has the patient contacted their pharmacy? No (Agent: If no, request that the patient contact the pharmacy for the refill. If patient does not wish to contact the pharmacy document the reason why and proceed with request.) (Agent: If yes, when and what did the pharmacy advise?) is always advised to call doctor/ no refills   Is this the correct pharmacy for this prescription? Yes If no, delete pharmacy and type the correct one.  This is the patient's preferred pharmacy:  [id number] [place], [place] - [address] AT ACROSS FROM [facility] [address] [place] [address] Phone: [phone] Fax: [phone]   Has the prescription been filled recently? No  Is the patient out of the medication? No  Has the patient been seen for an appointment in the last year OR does the patient have an upcoming appointment? Yes  Can we respond through MyChart? Yes  Agent: Please be advised that Rx refills may take up to 3 business days. We ask that you follow-up with your pharmacy.

## 2024-03-01 NOTE — Telephone Encounter (Signed)
 Requesting: Adderall 10mg , Adderall 20mg , alprazolam  1mg  Contract: Yes UDS: 10/06/23 Last Visit: 10/06/2023 Next Visit: 04/18/2024 Last Refill: 01/26/24- Adderall 12/02/23 for alprazolam   Please Advise

## 2024-03-02 MED ORDER — AMPHETAMINE-DEXTROAMPHET ER 20 MG PO CP24
20.0000 mg | ORAL_CAPSULE | ORAL | 0 refills | Status: DC
Start: 2024-03-02 — End: 2024-04-05

## 2024-03-02 MED ORDER — ALPRAZOLAM 1 MG PO TABS
ORAL_TABLET | ORAL | 0 refills | Status: DC
Start: 2024-03-02 — End: 2024-06-02

## 2024-03-02 MED ORDER — AMPHETAMINE-DEXTROAMPHETAMINE 10 MG PO TABS: 10.0000 mg | ORAL_TABLET | Freq: Every day | ORAL | 0 refills | Status: DC | PRN

## 2024-03-14 LAB — HM DIABETES EYE EXAM

## 2024-03-17 NOTE — Telephone Encounter (Signed)
 Copied from CRM [id number]. Topic: Medicare AWV >> Mar 17, 2024  1:56 PM Juliana Ocean wrote: Reason for CRM: LVM 03/17/2024 to schedule AWV. Please schedule Virtual or Telehealth visits ONLY  Rosalee Collins; Care Guide Ambulatory Clinical Support [place] [facility] Direct Dial: [phone]

## 2024-03-20 ENCOUNTER — Other Ambulatory Visit: Payer: Self-pay | Admitting: Family Medicine

## 2024-04-04 ENCOUNTER — Other Ambulatory Visit: Payer: Self-pay | Admitting: Family Medicine

## 2024-04-05 ENCOUNTER — Other Ambulatory Visit: Payer: Self-pay | Admitting: Family Medicine

## 2024-04-05 MED ORDER — AMPHETAMINE-DEXTROAMPHETAMINE 10 MG PO TABS: 10.0000 mg | ORAL_TABLET | Freq: Every day | ORAL | 0 refills | Status: DC | PRN

## 2024-04-05 MED ORDER — AMPHETAMINE-DEXTROAMPHET ER 20 MG PO CP24: 20.0000 mg | ORAL_CAPSULE | ORAL | 0 refills | Status: DC

## 2024-04-05 NOTE — Telephone Encounter (Signed)
 Requesting: Adderall XR 20mg  and Adderall 10mg   Contract: 04/07/23 UDS: 10/06/23 Last Visit: 01/05/24 Next Visit: 04/18/24 Last Refill: see med list  Please Advise

## 2024-04-05 NOTE — Telephone Encounter (Signed)
 Copied from CRM [id number]. Topic: Clinical - Medication Refill >> Apr 05, 2024  3:53 PM Earnestine Goes B wrote: Medication: amphetamine -dextroamphetamine  (ADDERALL XR) 20 MG 24 hr capsule , amphetamine -dextroamphetamine  (ADDERALL) 10 MG tablet  Has the patient contacted their pharmacy? Yes (Agent: If no, request that the patient contact the pharmacy for the refill. If patient does not wish to contact the pharmacy document the reason why and proceed with request.) (Agent: If yes, when and what did the pharmacy advise?)  This is the patient's preferred pharmacy:  [id number] - [place], [place] - [address] AT [address]CROSS FROM [facility] [address] [place] [address] Phone: [phone] Fax: [phone]  Is this the correct pharmacy for this prescription? Yes If no, delete pharmacy and type the correct one.   Has the prescription been filled recently? Yes  Is the patient out of the medication? Yes  Has the patient been seen for an appointment in the last year OR does the patient have an upcoming appointment? Yes  Can we respond through MyChart? Yes  Agent: Please be advised that Rx refills may take up to 3 business days. We ask that you follow-up with your pharmacy.

## 2024-04-13 ENCOUNTER — Telehealth: Payer: Self-pay

## 2024-04-13 ENCOUNTER — Encounter: Payer: Self-pay | Admitting: Pharmacist

## 2024-04-13 ENCOUNTER — Other Ambulatory Visit (HOSPITAL_BASED_OUTPATIENT_CLINIC_OR_DEPARTMENT_OTHER): Payer: Self-pay | Admitting: Family Medicine

## 2024-04-13 DIAGNOSIS — Z1231 Encounter for screening mammogram for malignant neoplasm of breast: Secondary | ICD-10-CM

## 2024-04-13 NOTE — Telephone Encounter (Signed)
 Copied from CRM [id number]. Topic: Referral - Request for Referral >> Apr 13, 2024  1:55 PM Earnestine Goes B wrote: Did the patient discuss referral with their provider in the last year? Yes (If No - schedule appointment) (If Yes - send message)  Appointment offered? Yes  Type of order/referral and detailed reason for visit:Mammogram .   Preference of office, provider, location: [facility]   If referral order, have you been seen by this specialty before? No (If Yes, this issue or another issue? When? Where?  Can we respond through MyChart? Yes

## 2024-04-13 NOTE — Telephone Encounter (Signed)
Pt already scheduled for mammogram.

## 2024-04-13 NOTE — Progress Notes (Signed)
 Pharmacy Quality Measure Review  This patient is appearing on a report for being at risk of failing the adherence measure for cholesterol (statin) medications this calendar year.   Medication: rosuvastatin   Last fill date: 12/23/2023 for 90 day supply  Spoke with patient. She reports she has a few days of rosuvastatin  left but does needed it refills. Contacted pharmacy to facilitate refills.  Cecilie Coffee, PharmD Clinical Pharmacist [facility]

## 2024-04-17 NOTE — Assessment & Plan Note (Signed)
 hgba1c acceptable, minimize simple carbs. Increase exercise as tolerated. Continue current meds

## 2024-04-17 NOTE — Assessment & Plan Note (Signed)
 Encouraged good sleep hygiene such as dark, quiet room. No blue/green glowing lights such as computer screens in bedroom. No alcohol or stimulants in evening. Cut down on caffeine as able. Regular exercise is helpful but not just prior to bed time.  She struggles with feeling achy at night. Once she gets up and gets moving the next day she is feeling some better. Zolpidem  prn

## 2024-04-17 NOTE — Assessment & Plan Note (Signed)
 Tolerating statin, encouraged heart healthy diet, avoid trans fats, minimize simple carbs and saturated fats. Increase exercise as tolerated

## 2024-04-17 NOTE — Assessment & Plan Note (Signed)
Stable on Adderall

## 2024-04-17 NOTE — Assessment & Plan Note (Signed)
>>  ASSESSMENT AND PLAN FOR MIXED DIABETIC HYPERLIPIDEMIA ASSOCIATED WITH TYPE 2 DIABETES MELLITUS (HCC) WRITTEN ON 04/17/2024  5:24 PM BY BLYTH, STACEY A, MD  hgba1c acceptable, minimize simple carbs. Increase exercise as tolerated. Continue current meds

## 2024-04-17 NOTE — Assessment & Plan Note (Signed)
 Supplement and monitor

## 2024-04-18 ENCOUNTER — Encounter (HOSPITAL_BASED_OUTPATIENT_CLINIC_OR_DEPARTMENT_OTHER): Payer: Self-pay

## 2024-04-18 ENCOUNTER — Ambulatory Visit (HOSPITAL_BASED_OUTPATIENT_CLINIC_OR_DEPARTMENT_OTHER)
Admission: RE | Admit: 2024-04-18 | Discharge: 2024-04-18 | Disposition: A | Discharge status: Home / Self Care | Source: Ambulatory Visit | Attending: Family Medicine | Admitting: Family Medicine

## 2024-04-18 ENCOUNTER — Encounter: Payer: Self-pay | Admitting: Family Medicine

## 2024-04-18 ENCOUNTER — Ambulatory Visit: Payer: Self-pay | Admitting: Family Medicine

## 2024-04-18 ENCOUNTER — Ambulatory Visit (INDEPENDENT_AMBULATORY_CARE_PROVIDER_SITE_OTHER): Payer: Medicare HMO | Admitting: Family Medicine

## 2024-04-18 VITALS — BP 128/80 | HR 88 | Resp 16 | Ht 62.0 in | Wt 168.4 lb

## 2024-04-18 DIAGNOSIS — E782 Mixed hyperlipidemia: Secondary | ICD-10-CM

## 2024-04-18 DIAGNOSIS — E785 Hyperlipidemia, unspecified: Secondary | ICD-10-CM | POA: Diagnosis not present

## 2024-04-18 DIAGNOSIS — E1169 Type 2 diabetes mellitus with other specified complication: Secondary | ICD-10-CM | POA: Diagnosis not present

## 2024-04-18 DIAGNOSIS — R41840 Attention and concentration deficit: Secondary | ICD-10-CM | POA: Diagnosis not present

## 2024-04-18 DIAGNOSIS — R7989 Other specified abnormal findings of blood chemistry: Secondary | ICD-10-CM | POA: Diagnosis not present

## 2024-04-18 DIAGNOSIS — Z1231 Encounter for screening mammogram for malignant neoplasm of breast: Secondary | ICD-10-CM | POA: Insufficient documentation

## 2024-04-18 DIAGNOSIS — G4700 Insomnia, unspecified: Secondary | ICD-10-CM

## 2024-04-18 LAB — COMPREHENSIVE METABOLIC PANEL WITH GFR
ALT: 47 U/L — ABNORMAL HIGH (ref 0–35)
AST: 38 U/L — ABNORMAL HIGH (ref 0–37)
Albumin: 4.7 g/dL (ref 3.5–5.2)
Alkaline Phosphatase: 61 U/L (ref 39–117)
BUN: 17 mg/dL (ref 6–23)
CO2: 34 meq/L — ABNORMAL HIGH (ref 19–32)
Calcium: 10.1 mg/dL (ref 8.4–10.5)
Chloride: 101 meq/L (ref 96–112)
Creatinine, Ser: 0.82 mg/dL (ref 0.40–1.20)
GFR: 70.72 mL/min (ref >60.00)
Glucose, Bld: 123 mg/dL — ABNORMAL HIGH (ref 70–99)
Potassium: 5.4 meq/L — ABNORMAL HIGH (ref 3.5–5.1)
Sodium: 143 meq/L (ref 135–145)
Total Bilirubin: 0.4 mg/dL (ref 0.2–1.2)
Total Protein: 7.5 g/dL (ref 6.0–8.3)

## 2024-04-18 LAB — LIPID PANEL
Cholesterol: 130 mg/dL (ref 0–200)
HDL: 45.3 mg/dL (ref >39.00)
LDL Cholesterol: 54 mg/dL (ref 0–99)
NonHDL: 84.41
Total CHOL/HDL Ratio: 3
Triglycerides: 153 mg/dL — ABNORMAL HIGH (ref 0.0–149.0)
VLDL: 30.6 mg/dL (ref 0.0–40.0)

## 2024-04-18 LAB — TSH: TSH: 2.2 u[IU]/mL (ref 0.35–5.50)

## 2024-04-18 LAB — CBC WITH DIFFERENTIAL/PLATELET
Basophils Absolute: 0 10*3/uL (ref 0.0–0.1)
Basophils Relative: 0.7 % (ref 0.0–3.0)
Eosinophils Absolute: 0.1 10*3/uL (ref 0.0–0.7)
Eosinophils Relative: 1.8 % (ref 0.0–5.0)
HCT: 41.5 % (ref 36.0–46.0)
Hemoglobin: 13.6 g/dL (ref 12.0–15.0)
Lymphocytes Relative: 38.2 % (ref 12.0–46.0)
Lymphs Abs: 2 10*3/uL (ref 0.7–4.0)
MCHC: 32.8 g/dL (ref 30.0–36.0)
MCV: 85.3 fl (ref 78.0–100.0)
Monocytes Absolute: 0.3 10*3/uL (ref 0.1–1.0)
Monocytes Relative: 4.9 % (ref 3.0–12.0)
Neutro Abs: 2.8 10*3/uL (ref 1.4–7.7)
Neutrophils Relative %: 54.4 % (ref 43.0–77.0)
Platelets: 205 10*3/uL (ref 150.0–400.0)
RBC: 4.87 Mil/uL (ref 3.87–5.11)
RDW: 14.2 % (ref 11.5–15.5)
WBC: 5.2 10*3/uL (ref 4.0–10.5)

## 2024-04-18 LAB — VITAMIN B12: Vitamin B-12: 416 pg/mL (ref 211–911)

## 2024-04-18 LAB — HEMOGLOBIN A1C: Hgb A1c MFr Bld: 6.6 % — ABNORMAL HIGH (ref 4.6–6.5)

## 2024-04-18 NOTE — Progress Notes (Signed)
 Subjective:    Patient ID: Tricia Fleming, female    DOB: 03/18/1951, 73 y.o.   MRN: [id number]  Chief Complaint  Patient presents with   Medical Management of Chronic Issues    Patient presents today for a 3 month follow-up   Quality Metric Gaps    Foot exam, eye exam    HPI Discussed the use of AI scribe software for clinical note transcription with the patient, who gave verbal consent to proceed.  History of Present Illness Tricia Fleming is a 73 year old female with diabetes and osteoarthritis who presents for a routine follow-up visit.  She feels generally well and believes her medications are effective. She takes Xanax , Ambien , and Adderall. No confusion or falls related to medication use.  Her last A1c was 6.4, slightly higher than previous values of 6.0 and 6.3. No foot pain or sores. She is aware of the importance of foot care due to diabetes.  She experiences stiffness and pain from osteoarthritis, particularly after sitting for prolonged periods or in the morning, with improvement upon movement. She takes Centrum Silver and has fish oil but has not been taking it recently.  She has a history of high calcium  levels and has stopped taking calcium  supplements. She consumes one to two servings of dairy daily and takes a multivitamin.  She takes Miralax regularly for bowel management and consumes a significant amount of fruit for fiber.  A mammogram was scheduled for today, with a previous delay due to a minor timing issue at the appointment.    Past Medical History:  Diagnosis Date   Anxiety    Atypical chest pain 12/08/2016   Breast lesion 06/18/2010   Qualifier: Diagnosis of  By: Domenica MD, Vauda Salvucci     Depression    Depression with anxiety 06/27/2011   Hyperglycemia 07/27/2013   Hyperlipidemia    Osteopenia 01/15/2016   Preventative health care 06/27/2011   Preventative health care 06/27/2011   SCC (squamous cell carcinoma), leg 05/04/2014   SCC    Shoulder pain,  bilateral 02/13/2013    Past Surgical History:  Procedure Laterality Date   BREAST BIOPSY Left    needle core biopsy, benign   CHOLECYSTECTOMY     TUBAL LIGATION      Family History  Problem Relation Age of Onset   Diabetes Mother    Hypertension Mother    Coronary artery disease Mother    Cancer Father        metastic prostate   Abdominal Wall Hernia Sister    Hypertension Sister    Alcohol abuse Sister    Memory loss Sister    CVA Sister    Heart disease Sister    Cancer Brother    Heart disease Brother    Bladder Cancer Brother    Prostate cancer Brother    Benign prostatic hyperplasia Brother    Lung disease Brother        in a smoker   AAA (abdominal aortic aneurysm) Brother    Valvular heart disease Brother    Alcohol abuse Brother    Benign prostatic hyperplasia Brother    Alcohol abuse Brother    Esophageal varices Brother    Drug abuse Son        drug overdose   Cancer Maternal Aunt    Birth defects Paternal Aunt        breast   Diabetes Maternal Grandmother    Coronary artery disease Maternal Grandmother  Diabetes Maternal Grandfather     Social History   Socioeconomic History   Marital status: Married    Spouse name: Not on file   Number of children: 3   Years of education: Not on file   Highest education level: Not on file  Occupational History   Occupation: retired  Tobacco Use   Smoking status: Never   Smokeless tobacco: Never  Substance and Sexual Activity   Alcohol use: Yes    Alcohol/week: 0.0 standard drinks of alcohol    Comment: a couple beers a week   Drug use: No   Sexual activity: Yes    Partners: Male  Other Topics Concern   Not on file  Social History Narrative   Married   2 cats, 1 dog   Babysits her grandson   Social Drivers of Corporate investment banker Strain: Low Risk  (03/31/2023)   Received from [facility]   Overall Financial Resource Strain (CARDIA)    Difficulty of Paying Living Expenses: Not hard at  all  Food Insecurity: No Food Insecurity (03/31/2023)   Received from [facility]   Hunger Vital Sign    Within the past 12 months, you worried that your food would run out before you got the money to buy more.: Never true    Within the past 12 months, the food you bought just didn't last and you didn't have money to get more.: Never true  Transportation Needs: No Transportation Needs (03/31/2023)   Received from [facility] - Transportation    Lack of Transportation (Medical): No    Lack of Transportation (Non-Medical): No  Physical Activity: Unknown (03/31/2023)   Received from [facility]   Exercise Vital Sign    On average, how many days per week do you engage in moderate to strenuous exercise (like a brisk walk)?: 0 days    Minutes of Exercise per Session: Not on file  Stress: Stress Concern Present (03/31/2023)   Received from [facility] - Occupational Stress Questionnaire    Feeling of Stress : Rather much  Social Connections: Somewhat Isolated (03/31/2023)   Received from [facility]   Social Network    How would you rate your social network (family, work, friends)?: Restricted participation with some degree of social isolation  Intimate Partner Violence: Not At Risk (03/31/2023)   Received from [facility]   HITS    Over the last 12 months how often did your partner physically hurt you?: Never    Over the last 12 months how often did your partner insult you or talk down to you?: Rarely    Over the last 12 months how often did your partner threaten you with physical harm?: Never    Over the last 12 months how often did your partner scream or curse at you?: Rarely    Outpatient Medications Prior to Visit  Medication Sig Dispense Refill   acetaminophen (TYLENOL) 500 MG tablet Take 500 mg by mouth 2 (two) times daily as needed.     ALPRAZolam  (XANAX ) 1 MG tablet TAKE 1 TABLET BY MOUTH TWICE A DAY AS NEEDED FOR ANXIETY 180  tablet 0   amphetamine -dextroamphetamine  (ADDERALL XR) 20 MG 24 hr capsule Take 1 capsule (20 mg total) by mouth every morning. 30 capsule 0   amphetamine -dextroamphetamine  (ADDERALL) 10 MG tablet Take 1 tablet (10 mg total) by mouth daily as needed. 30 tablet 0   Biotin 5000 MCG  CAPS Take by mouth.     Blood Glucose Monitoring Suppl (ONETOUCH VERIO FLEX SYSTEM) w/Device KIT Use to check blood sugar once a day. Dx code : E11.69 1 kit 0   buPROPion  (WELLBUTRIN  XL) 150 MG 24 hr tablet Take 1 tablet (150 mg total) by mouth daily. 90 tablet 1   celecoxib (CELEBREX) 100 MG capsule Take 100 mg by mouth daily.     fish oil-omega-3 fatty acids 1000 MG capsule Take 1,000 mg by mouth daily. 2 caps     glucose blood (ONETOUCH VERIO) test strip Use to check blood sugar once a day. Dx code : E11.69 100 each 1   levothyroxine  (SYNTHROID ) 25 MCG tablet TAKE 1 TABLET BY MOUTH EVERY DAY 90 tablet 3   nitroGLYCERIN  (NITROSTAT ) 0.4 MG SL tablet Place 1 tablet (0.4 mg total) under the tongue every 5 (five) minutes as needed for chest pain. 25 tablet 1   OneTouch Delica Lancets 33G MISC Use to check blood sugar once a day. Dx code : E11.69 100 each 1   rosuvastatin  (CRESTOR ) 40 MG tablet TAKE 1 TABLET BY MOUTH EVERYDAY AT BEDTIME 90 tablet 1   sertraline  (ZOLOFT ) 50 MG tablet Take 1 tablet (50 mg total) by mouth at bedtime. 90 tablet 1   Turmeric 500 MG CAPS Take 2 capsules by mouth daily.     zolpidem  (AMBIEN ) 10 MG tablet Take 1 tablet (10 mg total) by mouth at bedtime as needed. for sleep 90 tablet 1   No facility-administered medications prior to visit.    Allergies  Allergen Reactions   Meloxicam  Other (See Comments)    HEADACHE, POOR APPETITE, AND ANXIOUS    Review of Systems  Constitutional:  Positive for malaise/fatigue. Negative for fever.  HENT:  Negative for congestion.   Eyes:  Negative for blurred vision.  Respiratory:  Negative for shortness of breath.   Cardiovascular:  Negative for chest  pain, palpitations and leg swelling.  Gastrointestinal:  Negative for abdominal pain, blood in stool and nausea.  Genitourinary:  Negative for dysuria and frequency.  Musculoskeletal:  Positive for joint pain. Negative for falls.  Skin:  Negative for rash.  Neurological:  Negative for dizziness, loss of consciousness and headaches.  Endo/Heme/Allergies:  Negative for environmental allergies.  Psychiatric/Behavioral:  Negative for depression. The patient is not nervous/anxious.        Objective:    Physical Exam Constitutional:      General: She is not in acute distress.    Appearance: Normal appearance. She is well-developed. She is not toxic-appearing.  HENT:     Head: Normocephalic and atraumatic.     Right Ear: External ear normal.     Left Ear: External ear normal.     Nose: Nose normal.   Eyes:     General:        Right eye: No discharge.        Left eye: No discharge.     Conjunctiva/sclera: Conjunctivae normal.   Neck:     Thyroid : No thyromegaly.   Cardiovascular:     Rate and Rhythm: Normal rate and regular rhythm.     Heart sounds: Normal heart sounds. No murmur heard. Pulmonary:     Effort: Pulmonary effort is normal. No respiratory distress.     Breath sounds: Normal breath sounds.  Abdominal:     General: Bowel sounds are normal.     Palpations: Abdomen is soft.     Tenderness: There is no abdominal tenderness. There  is no guarding.   Musculoskeletal:        General: Normal range of motion.     Cervical back: Neck supple.  Lymphadenopathy:     Cervical: No cervical adenopathy.   Skin:    General: Skin is warm and dry.   Neurological:     Mental Status: She is alert and oriented to person, place, and time.   Psychiatric:        Mood and Affect: Mood normal.        Behavior: Behavior normal.        Thought Content: Thought content normal.        Judgment: Judgment normal.     BP 128/80   Pulse 88   Resp 16   Ht 5' 2 (1.575 m)   Wt 168 lb  6.4 oz (76.4 kg)   SpO2 97%   BMI 30.80 kg/m  Wt Readings from Last 3 Encounters:  04/18/24 168 lb 6.4 oz (76.4 kg)  10/06/23 166 lb 6.4 oz (75.5 kg)  08/28/23 170 lb 3.2 oz (77.2 kg)    Diabetic Foot Exam - Simple   Simple Foot Form Diabetic Foot exam was performed with the following findings: Yes   Visual Inspection No deformities, no ulcerations, no other skin breakdown bilaterally: Yes Sensation Testing Intact to touch and monofilament testing bilaterally: Yes Pulse Check Posterior Tibialis and Dorsalis pulse intact bilaterally: Yes Comments    Lab Results  Component Value Date   WBC 7.2 01/11/2024   HGB 13.3 01/11/2024   HCT 40.2 01/11/2024   PLT 235.0 01/11/2024   GLUCOSE 117 (H) 01/11/2024   CHOL 131 01/11/2024   TRIG 147.0 01/11/2024   HDL 42.50 01/11/2024   LDLDIRECT 68.0 03/16/2023   LDLCALC 59 01/11/2024   ALT 27 01/11/2024   AST 31 01/11/2024   NA 140 01/11/2024   K 4.8 01/11/2024   CL 100 01/11/2024   CREATININE 0.65 01/11/2024   BUN 15 01/11/2024   CO2 31 01/11/2024   TSH 1.63 01/11/2024   HGBA1C 6.4 01/11/2024   MICROALBUR 1.1 01/11/2024    Lab Results  Component Value Date   TSH 1.63 01/11/2024   Lab Results  Component Value Date   WBC 7.2 01/11/2024   HGB 13.3 01/11/2024   HCT 40.2 01/11/2024   MCV 87.1 01/11/2024   PLT 235.0 01/11/2024   Lab Results  Component Value Date   NA 140 01/11/2024   K 4.8 01/11/2024   CO2 31 01/11/2024   GLUCOSE 117 (H) 01/11/2024   BUN 15 01/11/2024   CREATININE 0.65 01/11/2024   BILITOT 0.4 01/11/2024   ALKPHOS 72 01/11/2024   AST 31 01/11/2024   ALT 27 01/11/2024   PROT 7.6 01/11/2024   ALBUMIN 4.8 01/11/2024   CALCIUM  9.9 01/11/2024   GFR 87.21 01/11/2024   Lab Results  Component Value Date   CHOL 131 01/11/2024   Lab Results  Component Value Date   HDL 42.50 01/11/2024   Lab Results  Component Value Date   LDLCALC 59 01/11/2024   Lab Results  Component Value Date   TRIG 147.0  01/11/2024   Lab Results  Component Value Date   CHOLHDL 3 01/11/2024   Lab Results  Component Value Date   HGBA1C 6.4 01/11/2024       Assessment & Plan:  Attention deficit Assessment & Plan: Stable on Adderall   Orders: -     TSH  Low vitamin B12 level Assessment & Plan: Supplement and  monitor   Orders: -     CBC with Differential/Platelet -     TSH -     Vitamin B12  Hyperlipidemia, unspecified hyperlipidemia type Assessment & Plan: Tolerating statin, encouraged heart healthy diet, avoid trans fats, minimize simple carbs and saturated fats. Increase exercise as tolerated   Orders: -     Lipid panel -     TSH  Insomnia, unspecified type Assessment & Plan: Encouraged good sleep hygiene such as dark, quiet room. No blue/green glowing lights such as computer screens in bedroom. No alcohol or stimulants in evening. Cut down on caffeine as able. Regular exercise is helpful but not just prior to bed time.  She struggles with feeling achy at night. Once she gets up and gets moving the next day she is feeling some better. Zolpidem  prn  Orders: -     Lipid panel -     Comprehensive metabolic panel with GFR -     TSH  Mixed diabetic hyperlipidemia associated with type 2 diabetes mellitus (HCC) Assessment & Plan: hgba1c acceptable, minimize simple carbs. Increase exercise as tolerated. Continue current meds   Orders: -     Hemoglobin A1c -     TSH    Assessment and Plan Assessment & Plan Type 2 Diabetes Mellitus A1c at 6.4, requires closer monitoring and potential management adjustment. - Order A1c test to monitor glucose control. - Advise on regular monitoring and lifestyle modifications.  Peripheral Neuropathy At risk due to diabetes. Discussed foot care and risks of unnoticed injuries. - Advise against going barefoot. - Perform foot examination. - Educate on signs of neuropathy and importance of foot care.  Osteoarthritis Stiffness and pain after  sitting, improves with movement. Discussed activity, hydration, and fish oil benefits. - Encourage regular physical activity. - Discuss potential benefits of fish oil. - Advise on hydration and dietary measures.  Low Vitamin B12 Level Plans to assess levels. - Order B12 level test.  General Health Maintenance Advised on hydration, calcium  citrate benefits, and fiber supplementation. - Advise on adequate hydration. - Recommend calcium  citrate if needed. - Discuss benefits of fiber supplementation.  Follow-up Mammogram scheduled, follow-up in fall, physical exam in January. - Schedule follow-up visit in fall. - Ensure mammogram is completed today. - Plan for physical examination in January, including Pap smear.     Harlene Horton, MD

## 2024-04-18 NOTE — Patient Instructions (Addendum)
 Calcium  Citrate (Citracal) absorbs better than Calcium  Carbonate (Caltrate)    Osteoarthritis  Osteoarthritis is a type of arthritis. It refers to joint pain or joint disease. Osteoarthritis affects tissue that covers the ends of bones in joints (cartilage). Cartilage acts as a cushion between the bones and helps them move smoothly. Osteoarthritis occurs when cartilage in the joints gets worn down. Osteoarthritis is sometimes called wear and tear arthritis. Osteoarthritis is the most common form of arthritis. It often occurs in older people. It is a condition that gets worse over time. The joints most often affected by this condition are in the fingers, toes, hips, knees, and spine, including the neck and lower back. What are the causes? This condition is caused by the wearing down of cartilage that covers the ends of bones. What increases the risk? The following factors may make you more likely to develop this condition: Being age 74 or older. Obesity. Overuse of joints. Past injury of a joint. Past surgery on a joint. Family history of osteoarthritis. What are the signs or symptoms? The main symptoms of this condition are pain, swelling, and stiffness in the joint. Other symptoms may include: An enlarged joint. More pain and further damage caused by small pieces of bone or cartilage that break off and float inside of the joint. Small deposits of bone (osteophytes) that grow on the edges of the joint. A grating or scraping feeling inside the joint when you move it. Popping or creaking sounds when you move. Difficulty walking or exercising. An inability to grip items, twist your hand, or control the movements of your hands and fingers. How is this diagnosed? This condition may be diagnosed based on: Your medical history. A physical exam. Your symptoms. X-rays of the affected joints. Blood tests to rule out other types of arthritis. How is this treated? There is no cure for this  condition, but treatment can help control pain and improve joint function. Treatment may include a combination of therapies, such as: Pain relief techniques, such as: Applying heat and cold to the joint. Massage. A form of talk therapy called cognitive behavioral therapy (CBT). This therapy helps you set goals and follow up on the changes that you make. Medicines for pain and inflammation. The medicines can be taken by mouth or applied to the skin. They include: NSAIDs, such as ibuprofen. Prescription medicines. Strong anti-inflammatory medicines (corticosteroids). Certain nutritional supplements. A prescribed exercise program. You may work with a physical therapist. Assistive devices, such as a brace, wrap, splint, specialized glove, or cane. A weight control plan. Surgery, such as: An osteotomy. This is done to reposition the bones and relieve pain or to remove loose pieces of bone and cartilage. Joint replacement surgery. You may need this surgery if you have advanced osteoarthritis. Follow these instructions at home: Activity Rest your affected joints as told by your health care provider. Exercise as told by your provider. The provider may recommend specific types of exercise, such as: Strengthening exercises. These are done to strengthen the muscles that support joints affected by arthritis. Aerobic activities. These are exercises, such as brisk walking or water aerobics, that increase your heart rate. Range-of-motion activities. These help your joints move more easily. Balance and agility exercises. Managing pain, stiffness, and swelling     If told, apply heat to the affected area as often as told by your provider. Use the heat source that your provider recommends, such as a moist heat pack or a heating pad. If you have a  removable assistive device, remove it as told by your provider. Place a towel between your skin and the heat source. If your provider tells you to keep the  assistive device on while you apply heat, place a towel between the assistive device and the heat source. Leave the heat on for 20-30 minutes. If told, put ice on the affected area. If you have a removable assistive device, remove it as told by your provider. Put ice in a plastic bag. Place a towel between your skin and the bag. If your provider tells you to keep the assistive device on during icing, place a towel between the assistive device and the bag. Leave the ice on for 20 minutes, 2-3 times a day. If your skin turns bright red, remove the ice or heat right away to prevent skin damage. The risk of damage is higher if you cannot feel pain, heat, or cold. Move your fingers or toes often to reduce stiffness and swelling. Raise (elevate) the affected area above the level of your heart while you are sitting or lying down. General instructions Take over-the-counter and prescription medicines only as told by your provider. Maintain a healthy weight. Follow instructions from your provider for weight control. Do not use any products that contain nicotine or tobacco. These products include cigarettes, chewing tobacco, and vaping devices, such as e-cigarettes. If you need help quitting, ask your provider. Use assistive devices as told by your provider. Where to find more information [employer] of Arthritis and Musculoskeletal and Skin Diseases: niams.http://www.myers.net/ [employer] on Aging: BaseRingTones.pl American College of Rheumatology: rheumatology.org Contact a health care provider if: You have redness, swelling, or a feeling of warmth in a joint that gets worse. You have a fever along with joint or muscle aches. You develop a rash. You have trouble doing your normal activities. You have pain that gets worse and is not relieved by pain medicine. This information is not intended to replace advice given to you by your health care provider. Make sure you discuss any questions you have with your  health care provider. Document Revised: 06/12/2022 Document Reviewed: 06/12/2022 Elsevier Patient Education  2024 [employer].

## 2024-04-21 ENCOUNTER — Other Ambulatory Visit: Payer: Self-pay | Admitting: Family Medicine

## 2024-04-22 ENCOUNTER — Telehealth: Payer: Self-pay | Admitting: Family Medicine

## 2024-04-22 NOTE — Telephone Encounter (Unsigned)
 Copied from CRM [id number]. Topic: Clinical - Lab/Test Results >> Apr 22, 2024 11:37 AM Ernestene P wrote: Reason for CRM: Pt called back to go over lab results, relayed results- pt would like recommendation on what diet to have so potassium can be low pt can be reached [phone]

## 2024-04-25 ENCOUNTER — Other Ambulatory Visit: Payer: Self-pay

## 2024-04-25 ENCOUNTER — Other Ambulatory Visit (INDEPENDENT_AMBULATORY_CARE_PROVIDER_SITE_OTHER)

## 2024-04-25 DIAGNOSIS — E875 Hyperkalemia: Secondary | ICD-10-CM

## 2024-04-25 LAB — COMPREHENSIVE METABOLIC PANEL WITH GFR
ALT: 44 U/L — ABNORMAL HIGH (ref 0–35)
AST: 38 U/L — ABNORMAL HIGH (ref 0–37)
Albumin: 4.8 g/dL (ref 3.5–5.2)
Alkaline Phosphatase: 59 U/L (ref 39–117)
BUN: 16 mg/dL (ref 6–23)
CO2: 31 meq/L (ref 19–32)
Calcium: 9.7 mg/dL (ref 8.4–10.5)
Chloride: 99 meq/L (ref 96–112)
Creatinine, Ser: 0.72 mg/dL (ref 0.40–1.20)
GFR: 82.65 mL/min (ref >60.00)
Glucose, Bld: 119 mg/dL — ABNORMAL HIGH (ref 70–99)
Potassium: 5.1 meq/L (ref 3.5–5.1)
Sodium: 137 meq/L (ref 135–145)
Total Bilirubin: 0.5 mg/dL (ref 0.2–1.2)
Total Protein: 7.4 g/dL (ref 6.0–8.3)

## 2024-04-25 NOTE — Progress Notes (Signed)
 Patient reviewed via Mychart and came to have CMP lab repeat today, 04/25/24.

## 2024-04-26 ENCOUNTER — Ambulatory Visit: Payer: Self-pay | Admitting: Family Medicine

## 2024-04-26 NOTE — Progress Notes (Signed)
Patient reviewed via MyChart.

## 2024-05-05 ENCOUNTER — Other Ambulatory Visit: Payer: Self-pay | Admitting: Family Medicine

## 2024-05-05 MED ORDER — AMPHETAMINE-DEXTROAMPHETAMINE 10 MG PO TABS: 10.0000 mg | ORAL_TABLET | Freq: Every day | ORAL | 0 refills | Status: DC | PRN

## 2024-05-05 MED ORDER — AMPHETAMINE-DEXTROAMPHET ER 20 MG PO CP24: 20.0000 mg | ORAL_CAPSULE | ORAL | 0 refills | Status: DC

## 2024-05-05 MED ORDER — ZOLPIDEM TARTRATE 10 MG PO TABS: 10.0000 mg | ORAL_TABLET | Freq: Every evening | ORAL | 1 refills | Status: DC | PRN

## 2024-05-05 NOTE — Telephone Encounter (Signed)
 Requesting: Adderall, Ambien   Contract: 04/07/23 UDS: 10/06/23 Last Visit: 04/18/24 Next Visit: 07/20/24 Last Refill: see med list   Please Advise

## 2024-05-05 NOTE — Telephone Encounter (Unsigned)
 Copied from CRM [id number]. Topic: Clinical - Medication Refill >> May 05, 2024  3:35 PM Franky GRADE wrote: Medication: amphetamine -dextroamphetamine  (ADDERALL XR) 20 MG 24 hr capsule [[id number]], amphetamine -dextroamphetamine  (ADDERALL) 10 MG tablet & zolpidem  (AMBIEN ) 10 MG tablet  Has the patient contacted their pharmacy? No (Agent: If no, request that the patient contact the pharmacy for the refill. If patient does not wish to contact the pharmacy document the reason why and proceed with request.) (Agent: If yes, when and what did the pharmacy advise?)  This is the patient's preferred pharmacy:  [id number] - [place], [place] - [address] AT ACROSS FROM [facility] [address] [place] [address] Phone: [phone] Fax: [phone]  Is this the correct pharmacy for this prescription? Yes If no, delete pharmacy and type the correct one.   Has the prescription been filled recently? No  Is the patient out of the medication? No  Has the patient been seen for an appointment in the last year OR does the patient have an upcoming appointment? Yes  Can we respond through MyChart? Yes  Agent: Please be advised that Rx refills may take up to 3 business days. We ask that you follow-up with your pharmacy.

## 2024-05-20 ENCOUNTER — Telehealth: Payer: Self-pay | Admitting: Family Medicine

## 2024-05-20 NOTE — Telephone Encounter (Signed)
 Copied from CRM [id number]. Topic: Medicare AWV >> May 20, 2024  1:49 PM Nathanel DEL wrote: Reason for CRM: Called LVM 05/20/2024 to schedule AWV. Please schedule Virtual or Telehealth visits ONLY.   Nathanel Paschal; Care Guide Ambulatory Clinical Support [place] [facility] Direct Dial: [phone]

## 2024-06-02 ENCOUNTER — Other Ambulatory Visit: Payer: Self-pay | Admitting: Family Medicine

## 2024-06-02 DIAGNOSIS — R030 Elevated blood-pressure reading, without diagnosis of hypertension: Secondary | ICD-10-CM

## 2024-06-02 DIAGNOSIS — Z23 Encounter for immunization: Secondary | ICD-10-CM

## 2024-06-02 DIAGNOSIS — M8580 Other specified disorders of bone density and structure, unspecified site: Secondary | ICD-10-CM

## 2024-06-02 DIAGNOSIS — E782 Mixed hyperlipidemia: Secondary | ICD-10-CM

## 2024-06-02 DIAGNOSIS — R739 Hyperglycemia, unspecified: Secondary | ICD-10-CM

## 2024-06-02 MED ORDER — ALPRAZOLAM 1 MG PO TABS
ORAL_TABLET | ORAL | 0 refills | Status: DC
Start: 2024-06-02 — End: 2024-09-02

## 2024-06-02 NOTE — Telephone Encounter (Signed)
 Requesting: Xanax  1mg  Contract: 03/15/2023 UDS: 10/06/2023 Last Visit: 04/18/2024 Next Visit: 11/24/2024 w/ PCP 07/20/2024 w/ [place] Last Refill: 03/02/2024  Please Advise

## 2024-06-02 NOTE — Telephone Encounter (Unsigned)
 Copied from CRM [id number]. Topic: Clinical - Medication Refill >> Jun 02, 2024  8:55 AM Mercedes MATSU wrote: Medication: ALPRAZolam  (XANAX ) 1 MG tablet  Has the patient contacted their pharmacy? Yes (Agent: If no, request that the patient contact the pharmacy for the refill. If patient does not wish to contact the pharmacy document the reason why and proceed with request.) (Agent: If yes, when and what did the pharmacy advise?)  This is the patient's preferred pharmacy:  [id number] - [place], [place] - [address] AT ACROSS FROM [facility] [address] [place] [address] Phone: [phone] Fax: [phone]  Is this the correct pharmacy for this prescription? Yes If no, delete pharmacy and type the correct one.   Has the prescription been filled recently? Yes  Is the patient out of the medication? Yes  Has the patient been seen for an appointment in the last year OR does the patient have an upcoming appointment? Yes  Can we respond through MyChart? Yes  Agent: Please be advised that Rx refills may take up to 3 business days. We ask that you follow-up with your pharmacy.

## 2024-06-14 ENCOUNTER — Other Ambulatory Visit: Payer: Self-pay | Admitting: Family Medicine

## 2024-06-14 MED ORDER — AMPHETAMINE-DEXTROAMPHET ER 20 MG PO CP24: 20.0000 mg | ORAL_CAPSULE | ORAL | 0 refills | Status: DC

## 2024-06-14 NOTE — Telephone Encounter (Unsigned)
 Copied from CRM #[id number]. Topic: Clinical - Medication Refill >> Jun 14, 2024 11:40 AM Burnard DEL wrote: Medication: amphetamine -dextroamphetamine  (ADDERALL XR) 20 MG 24 hr capsule  amphetamine -dextroamphetamine  (ADDERALL XR) 20 MG 24 hr capsule  Has the patient contacted their pharmacy? No (Agent: If no, request that the patient contact the pharmacy for the refill. If patient does not wish to contact the pharmacy document the reason why and proceed with request.) (Agent: If yes, when and what did the pharmacy advise?)  This is the patient's preferred pharmacy:  [id number] - [place], [place] - [address] AT ACROSS FROM [facility] [address] [place] [address] Phone: [phone] Fax: [phone]  Is this the correct pharmacy for this prescription? No If no, delete pharmacy and type the correct one.   Has the prescription been filled recently? No  Is the patient out of the medication? No(few left)  Has the patient been seen for an appointment in the last year OR does the patient have an upcoming appointment? Yes  Can we respond through MyChart? Yes  Agent: Please be advised that Rx refills may take up to 3 business days. We ask that you follow-up with your pharmacy.

## 2024-06-14 NOTE — Telephone Encounter (Signed)
 Requesting: adderall Contract:10/06/23 UDS:10/06/23 Last Visit:04/18/24 Next Visit:07/20/24 Last Refill:05/05/24  Please Advise

## 2024-06-24 ENCOUNTER — Other Ambulatory Visit: Payer: Self-pay | Admitting: Family Medicine

## 2024-06-24 NOTE — Telephone Encounter (Unsigned)
 Copied from CRM [id number]. Topic: Clinical - Medication Refill >> Jun 24, 2024  4:38 PM Drema MATSU wrote: Medication: amphetamine -dextroamphetamine  (ADDERALL) 10 MG tablet  Has the patient contacted their pharmacy? Yes (Agent: If no, request that the patient contact the pharmacy for the refill. If patient does not wish to contact the pharmacy document the reason why and proceed with request.) pharmacy advised only 20 mg was called in  (Agent: If yes, when and what did the pharmacy advise?)  This is the patient's preferred pharmacy:  [id number] - [place], [place] - [address] AT ACROSS FROM [facility] [address] [place] [address] Phone: [phone] Fax: [phone]  Is this the correct pharmacy for this prescription? Yes If no, delete pharmacy and type the correct one.   Has the prescription been filled recently? Yes  Is the patient out of the medication? No couple left  Has the patient been seen for an appointment in the last year OR does the patient have an upcoming appointment? Yes  Can we respond through MyChart? Yes  Agent: Please be advised that Rx refills may take up to 3 business days. We ask that you follow-up with your pharmacy.

## 2024-06-25 MED ORDER — AMPHETAMINE-DEXTROAMPHETAMINE 10 MG PO TABS: 10.0000 mg | ORAL_TABLET | Freq: Every day | ORAL | 0 refills | Status: DC | PRN

## 2024-07-15 NOTE — Assessment & Plan Note (Signed)
 Stable on current medication.

## 2024-07-15 NOTE — Assessment & Plan Note (Addendum)
 Stable.  Zolpidem  as needed.  Patient was advised not to take this medication while consuming alcohol or using other sedating medications.

## 2024-07-15 NOTE — Assessment & Plan Note (Addendum)
 Follows with pulmonology has been tested  and diagnosed with OSA.

## 2024-07-15 NOTE — Assessment & Plan Note (Signed)
 Stable on current medications. Denies SI/HI.

## 2024-07-15 NOTE — Assessment & Plan Note (Signed)
 hgba1c acceptable, minimize simple carbs. Increase exercise as tolerated. Continue current meds

## 2024-07-15 NOTE — Progress Notes (Addendum)
 Subjective:     Patient ID: Tricia Fleming, female    DOB: 1951-09-04, 73 y.o.   MRN: [id number]  Chief Complaint  Patient presents with   Follow-up    43mo; no specific concerns    HPI  Discussed the use of AI scribe software for clinical note transcription with the patient, who gave verbal consent to proceed.  Tricia Fleming is a 73 year old female presents for follow-up chronic conditions.  She was diagnosed with OSA per pulmonology home sleep study, she is currently not using a CPAP.  Overall feeling well.  She is interested in GLP-1 medications for weight loss.  Reports she has been trying to actively lose weight with diet and exercise but has had difficulties with successful weight loss.  Follows with dermatology, pulmonology, GI,  ADD-Adderall 10 mg tablet daily  HLD-rosuvastatin  40 mg  Hypothyroidism-Synthroid  25 mcg daily  Depression and anxiety- Wellbutrin  150 mg daily; sertraline  50 mg at bedtime Alprazolam  1 mg twice daily as needed for anxiety        07/20/2024    8:58 AM 04/18/2024    9:49 AM 10/06/2023    9:24 AM 06/22/2023    9:36 AM  GAD 7 : Generalized Anxiety Score  Nervous, Anxious, on Edge 1 1 2 1   Control/stop worrying 1 1 2 1   Worry too much - different things 1 1 2 1   Trouble relaxing 1 1 1  0  Restless 0 0 0 0  Easily annoyed or irritable  1 1 0  Afraid - awful might happen 0 1 1 0  Total GAD 7 Score  6 9 3   Anxiety Difficulty Somewhat difficult Somewhat difficult Somewhat difficult Not difficult at all     Flowsheet Row Office Visit from 07/20/2024 in [facility] at [facility]  PHQ-9 Total Score 7     Insomnia-Ambien  10 mg at bedtime as needed  Reports taking medications as prescribed, denies adverse Ses  Patient denies fever, chills, SOB, CP, palpitations, dyspnea, edema, HA, vision changes, N/V/D, abdominal pain, urinary symptoms, rash, weight changes, and recent illness or hospitalizations.     History of Present Illness              Health Maintenance Due  Topic Date Due   Medicare Annual Wellness (AWV)  07/23/2023    Past Medical History:  Diagnosis Date   Anxiety    Atypical chest pain 12/08/2016   Breast lesion 06/18/2010   Qualifier: Diagnosis of  By: Domenica MD, Stacey     Depression    Depression with anxiety 06/27/2011   Hyperglycemia 07/27/2013   Hyperlipidemia    Osteopenia 01/15/2016   Preventative health care 06/27/2011   Preventative health care 06/27/2011   SCC (squamous cell carcinoma), leg 05/04/2014   SCC    Shoulder pain, bilateral 02/13/2013    Past Surgical History:  Procedure Laterality Date   BREAST BIOPSY Left    needle core biopsy, benign   CHOLECYSTECTOMY     TUBAL LIGATION      Family History  Problem Relation Age of Onset   Diabetes Mother    Hypertension Mother    Coronary artery disease Mother    Cancer Father        metastic prostate   Abdominal Wall Hernia Sister    Hypertension Sister    Alcohol abuse Sister    Memory loss Sister    CVA Sister    Heart disease Sister  Cancer Brother    Heart disease Brother    Bladder Cancer Brother    Prostate cancer Brother    Benign prostatic hyperplasia Brother    Lung disease Brother        in a smoker   AAA (abdominal aortic aneurysm) Brother    Valvular heart disease Brother    Alcohol abuse Brother    Benign prostatic hyperplasia Brother    Alcohol abuse Brother    Esophageal varices Brother    Drug abuse Son        drug overdose   Cancer Maternal Aunt    Birth defects Paternal Aunt        breast   Diabetes Maternal Grandmother    Coronary artery disease Maternal Grandmother    Diabetes Maternal Grandfather     Social History   Socioeconomic History   Marital status: Married    Spouse name: Not on file   Number of children: 3   Years of education: Not on file   Highest education level: Not on file  Occupational History   Occupation: retired  Tobacco Use    Smoking status: Never   Smokeless tobacco: Never  Substance and Sexual Activity   Alcohol use: Yes    Alcohol/week: 0.0 standard drinks of alcohol    Comment: a couple beers a week   Drug use: No   Sexual activity: Yes    Partners: Male  Other Topics Concern   Not on file  Social History Narrative   Married   2 cats, 1 dog   Babysits her grandson   Social Drivers of Corporate investment banker Strain: Low Risk  (03/31/2023)   Received from [facility]   Overall Financial Resource Strain (CARDIA)    Difficulty of Paying Living Expenses: Not hard at all  Food Insecurity: No Food Insecurity (03/31/2023)   Received from [facility]   Hunger Vital Sign    Within the past 12 months, you worried that your food would run out before you got the money to buy more.: Never true    Within the past 12 months, the food you bought just didn't last and you didn't have money to get more.: Never true  Transportation Needs: No Transportation Needs (03/31/2023)   Received from [facility] - Transportation    Lack of Transportation (Medical): No    Lack of Transportation (Non-Medical): No  Physical Activity: Unknown (03/31/2023)   Received from [facility]   Exercise Vital Sign    On average, how many days per week do you engage in moderate to strenuous exercise (like a brisk walk)?: 0 days    Minutes of Exercise per Session: Not on file  Stress: Stress Concern Present (03/31/2023)   Received from [facility] - Occupational Stress Questionnaire    Feeling of Stress : Rather much  Social Connections: Somewhat Isolated (03/31/2023)   Received from [facility]   Social Network    How would you rate your social network (family, work, friends)?: Restricted participation with some degree of social isolation  Intimate Partner Violence: Not At Risk (03/31/2023)   Received from [facility]   HITS    Over the last 12 months how often did your  partner physically hurt you?: Never    Over the last 12 months how often did your partner insult you or talk down to you?: Rarely    Over the last 12 months how  often did your partner threaten you with physical harm?: Never    Over the last 12 months how often did your partner scream or curse at you?: Rarely    Outpatient Medications Prior to Visit  Medication Sig Dispense Refill   acetaminophen (TYLENOL) 500 MG tablet Take 500 mg by mouth 2 (two) times daily as needed.     ALPRAZolam  (XANAX ) 1 MG tablet TAKE 1 TABLET BY MOUTH TWICE A DAY AS NEEDED FOR ANXIETY 180 tablet 0   amphetamine -dextroamphetamine  (ADDERALL XR) 20 MG 24 hr capsule Take 1 capsule (20 mg total) by mouth every morning. 30 capsule 0   amphetamine -dextroamphetamine  (ADDERALL) 10 MG tablet Take 1 tablet (10 mg total) by mouth daily as needed. 30 tablet 0   Biotin 5000 MCG CAPS Take by mouth.     Blood Glucose Monitoring Suppl (ONETOUCH VERIO FLEX SYSTEM) w/Device KIT Use to check blood sugar once a day. Dx code : E11.69 1 kit 0   buPROPion  (WELLBUTRIN  XL) 150 MG 24 hr tablet Take 1 tablet (150 mg total) by mouth daily. 90 tablet 1   celecoxib (CELEBREX) 100 MG capsule Take 100 mg by mouth daily.     fish oil-omega-3 fatty acids 1000 MG capsule Take 1,000 mg by mouth daily. 2 caps     glucose blood (ONETOUCH VERIO) test strip Use to check blood sugar once a day. Dx code : E11.69 100 each 1   levothyroxine  (SYNTHROID ) 25 MCG tablet TAKE 1 TABLET BY MOUTH EVERY DAY 90 tablet 3   nitroGLYCERIN  (NITROSTAT ) 0.4 MG SL tablet Place 1 tablet (0.4 mg total) under the tongue every 5 (five) minutes as needed for chest pain. 25 tablet 1   OneTouch Delica Lancets 33G MISC Use to check blood sugar once a day. Dx code : E11.69 100 each 1   rosuvastatin  (CRESTOR ) 40 MG tablet TAKE 1 TABLET BY MOUTH EVERYDAY AT BEDTIME 90 tablet 1   Turmeric 500 MG CAPS Take 2 capsules by mouth daily.     zolpidem  (AMBIEN ) 10 MG tablet Take 1 tablet (10 mg  total) by mouth at bedtime as needed. for sleep 90 tablet 1   sertraline  (ZOLOFT ) 50 MG tablet Take 1 tablet (50 mg total) by mouth at bedtime. 90 tablet 1   No facility-administered medications prior to visit.    Allergies  Allergen Reactions   Meloxicam  Other (See Comments)    HEADACHE, POOR APPETITE, AND ANXIOUS    ROS See HPI    Objective:    Physical Exam  General: No acute distress. Awake and conversant. +obese Eyes: Normal conjunctiva, anicteric. Round symmetric pupils.   Respiratory: CTAB. Respirations are non-labored. No wheezing.  Skin: Warm. No rashes or ulcers.  Psych: Alert and oriented. Cooperative, Appropriate mood and affect, Normal judgment.  CV: RRR. No murmur. No lower extremity edema.  MSK: Normal ambulation. No clubbing or cyanosis.  Neuro:  CN II-XII grossly normal.       BP 138/80   Pulse 87   Temp 97.6 F (36.4 C)   Ht 5' 2 (1.575 m)   Wt 168 lb (76.2 kg)   SpO2 95%   BMI 30.73 kg/m       Assessment & Plan:   Problem List Items Addressed This Visit     Attention deficit   Stable on current medication.      Class 1 obesity with serious comorbidity and body mass index (BMI) of 30.0 to 30.9 in adult   Class I obesity  with OSA and prediabetes.  Rx- Zepbound . Medication and common side effects reviewed with the patient; patient voiced understanding and had no further questions at this time. Encouraged patient to return for a nurse visit if assistance with injection technique is needed; provided instructions in AVS. Encouraged DASH or MIND diet, decrease po intake and increase exercise as tolerated. Needs 7-8 hours of sleep nightly. Avoid trans fats, eat small, frequent meals every 4-5 hours with lean proteins, complex carbs and healthy fats. Minimize simple carbs, high fat foods and processed foods    Wt Readings from Last 3 Encounters:  07/20/24 168 lb (76.2 kg)  04/18/24 168 lb 6.4 oz (76.4 kg)  10/06/23 166 lb 6.4 oz (75.5 kg)         Relevant Medications   tirzepatide  (ZEPBOUND ) 2.5 MG/0.5ML Pen   Depression with anxiety   Stable on current medications.  Denies SI/HI.      ELEVATED BP READING WITHOUT DX HYPERTENSION   Minimize caffeine and sodium; monitor BP at home; RTC if BP >140/90.        High risk medication use   UDS and contract updated today      Relevant Orders   Drug Monitoring Panel [phone] , Urine   Hyperlipidemia associated with type 2 diabetes mellitus (HCC)    >>ASSESSMENT AND PLAN FOR HYPERLIPIDEMIA ASSOCIATED WITH TYPE 2 DIABETES MELLITUS (HCC) WRITTEN ON 07/20/2024  9:02 AM BY Gyneth Hubka L, NP  Encourage heart healthy diet such as MIND or DASH diet, increase exercise, avoid trans fats, simple carbohydrates and processed foods, consider a krill or fish or flaxseed oil cap daily.    The 10-year ASCVD risk score (Arnett DK, et al., 2019) is: 35.1%   Values used to calculate the score:     Age: 35 years     Clincally relevant sex: Female     Is Non-Hispanic African American: No     Diabetic: Yes     Tobacco smoker: No     Systolic Blood Pressure: 158 mmHg     Is BP treated: No     HDL Cholesterol: 45.3 mg/dL     Total Cholesterol: 130 mg/dL    >>ASSESSMENT AND PLAN FOR MIXED DIABETIC HYPERLIPIDEMIA ASSOCIATED WITH TYPE 2 DIABETES MELLITUS (HCC) WRITTEN ON 07/15/2024 12:48 PM BY Kal Chait L, NP  hgba1c acceptable, minimize simple carbs. Increase exercise as tolerated. Continue current meds        Hypothyroidism   Stable on levothyroxine .  Continue to monitor.      Insomnia - Primary   Stable.  Zolpidem  as needed.  Patient was advised not to take this medication while consuming alcohol or using other sedating medications.      OSA (obstructive sleep apnea)   Follows with pulmonology has been tested  and diagnosed with OSA.      Relevant Medications   tirzepatide  (ZEPBOUND ) 2.5 MG/0.5ML Pen   Prediabetes   hgba1c acceptable, minimize simple carbs. Increase  exercise as tolerated. Continue current meds.      Other Visit Diagnoses       Mixed diabetic hyperlipidemia associated with type 2 diabetes mellitus (HCC)         Flu vaccine need       Relevant Orders   Flu vaccine HIGH DOSE PF(Fluzone Trivalent) (Completed)     Depression & anxiety Patient expressed interest in tapering antidepressants, reports concerns they may affect motivation. Currently taking alprazolam  each morning; advised to limit use of alprazolam  to PRN for  panic/ anxiety due to likely contribution to symptoms. Mood stable, at this time recommended continuing sertraline  and bupropion  as prescribed, with morning dosing of these agents. Discontinue alprazolam  in AM unless needed for panic/ anxiety, and monitor for symptom changes.    BMI Readings from Last 1 Encounters:  07/20/24 30.73 kg/m     I am having Tricia DEL. Lipa start on Zepbound . I am also having her maintain her fish oil-omega-3 fatty acids, Biotin, nitroGLYCERIN , acetaminophen, Turmeric, celecoxib, OneTouch Delica Lancets 33G, OneTouch Verio, OneTouch Verio Flex System, rosuvastatin , levothyroxine , buPROPion , zolpidem , ALPRAZolam , amphetamine -dextroamphetamine , and amphetamine -dextroamphetamine .  Meds ordered this encounter  Medications   tirzepatide  (ZEPBOUND ) 2.5 MG/0.5ML Pen    Sig: Inject 2.5 mg into the skin once a week.    Dispense:  2 mL    Refill:  2    Supervising Provider:   DOMENICA BLACKBIRD A [[id number]]

## 2024-07-20 ENCOUNTER — Ambulatory Visit: Admitting: Student

## 2024-07-20 ENCOUNTER — Encounter: Payer: Self-pay | Admitting: Student

## 2024-07-20 VITALS — BP 138/80 | HR 87 | Temp 97.6°F | Ht 62.0 in | Wt 168.0 lb

## 2024-07-20 DIAGNOSIS — Z79899 Other long term (current) drug therapy: Secondary | ICD-10-CM

## 2024-07-20 DIAGNOSIS — E785 Hyperlipidemia, unspecified: Secondary | ICD-10-CM | POA: Diagnosis not present

## 2024-07-20 DIAGNOSIS — E66811 Obesity, class 1: Secondary | ICD-10-CM

## 2024-07-20 DIAGNOSIS — E039 Hypothyroidism, unspecified: Secondary | ICD-10-CM

## 2024-07-20 DIAGNOSIS — G4700 Insomnia, unspecified: Secondary | ICD-10-CM | POA: Diagnosis not present

## 2024-07-20 DIAGNOSIS — R030 Elevated blood-pressure reading, without diagnosis of hypertension: Secondary | ICD-10-CM | POA: Diagnosis not present

## 2024-07-20 DIAGNOSIS — R41840 Attention and concentration deficit: Secondary | ICD-10-CM

## 2024-07-20 DIAGNOSIS — Z23 Encounter for immunization: Secondary | ICD-10-CM

## 2024-07-20 DIAGNOSIS — E1169 Type 2 diabetes mellitus with other specified complication: Secondary | ICD-10-CM

## 2024-07-20 DIAGNOSIS — F418 Other specified anxiety disorders: Secondary | ICD-10-CM | POA: Diagnosis not present

## 2024-07-20 DIAGNOSIS — G4733 Obstructive sleep apnea (adult) (pediatric): Secondary | ICD-10-CM | POA: Diagnosis not present

## 2024-07-20 DIAGNOSIS — Z6830 Body mass index (BMI) 30.0-30.9, adult: Secondary | ICD-10-CM | POA: Diagnosis not present

## 2024-07-20 DIAGNOSIS — E875 Hyperkalemia: Secondary | ICD-10-CM

## 2024-07-20 DIAGNOSIS — R7303 Prediabetes: Secondary | ICD-10-CM | POA: Insufficient documentation

## 2024-07-20 MED ORDER — ZEPBOUND 2.5 MG/0.5ML ~~LOC~~ SOAJ: 2.5000 mg | SUBCUTANEOUS | 2 refills | Status: DC

## 2024-07-20 NOTE — Assessment & Plan Note (Addendum)
 Class I obesity with OSA and prediabetes.  Rx- Zepbound . Medication and common side effects reviewed with the patient; patient voiced understanding and had no further questions at this time. Encouraged patient to return for a nurse visit if assistance with injection technique is needed; provided instructions in AVS. Encouraged DASH or MIND diet, decrease po intake and increase exercise as tolerated. Needs 7-8 hours of sleep nightly. Avoid trans fats, eat small, frequent meals every 4-5 hours with lean proteins, complex carbs and healthy fats. Minimize simple carbs, high fat foods and processed foods    Wt Readings from Last 3 Encounters:  07/20/24 168 lb (76.2 kg)  04/18/24 168 lb 6.4 oz (76.4 kg)  10/06/23 166 lb 6.4 oz (75.5 kg)

## 2024-07-20 NOTE — Assessment & Plan Note (Addendum)
>>  ASSESSMENT AND PLAN FOR HYPERLIPIDEMIA ASSOCIATED WITH TYPE 2 DIABETES MELLITUS (HCC) WRITTEN ON 07/20/2024  9:02 AM BY Shontez Sermon L, NP  Encourage heart healthy diet such as MIND or DASH diet, increase exercise, avoid trans fats, simple carbohydrates and processed foods, consider a krill or fish or flaxseed oil cap daily.    The 10-year ASCVD risk score (Arnett DK, et al., 2019) is: 35.1%   Values used to calculate the score:     Age: 73 years     Clincally relevant sex: Female     Is Non-Hispanic African American: No     Diabetic: Yes     Tobacco smoker: No     Systolic Blood Pressure: 158 mmHg     Is BP treated: No     HDL Cholesterol: 45.3 mg/dL     Total Cholesterol: 130 mg/dL    >>ASSESSMENT AND PLAN FOR MIXED DIABETIC HYPERLIPIDEMIA ASSOCIATED WITH TYPE 2 DIABETES MELLITUS (HCC) WRITTEN ON 07/15/2024 12:48 PM BY Coal Nearhood L, NP  hgba1c acceptable, minimize simple carbs. Increase exercise as tolerated. Continue current meds

## 2024-07-20 NOTE — Assessment & Plan Note (Signed)
 Stable on levothyroxine .  Continue to monitor.

## 2024-07-20 NOTE — Assessment & Plan Note (Signed)
 UDS and contract updated today

## 2024-07-20 NOTE — Assessment & Plan Note (Signed)
 hgba1c acceptable, minimize simple carbs. Increase exercise as tolerated. Continue current meds

## 2024-07-20 NOTE — Assessment & Plan Note (Addendum)
 Minimize caffeine and sodium; monitor BP at home; RTC if BP >140/90.

## 2024-07-25 DIAGNOSIS — H5201 Hypermetropia, right eye: Secondary | ICD-10-CM | POA: Diagnosis not present

## 2024-07-28 ENCOUNTER — Telehealth: Payer: Self-pay | Admitting: Family Medicine

## 2024-07-28 NOTE — Telephone Encounter (Unsigned)
 Copied from CRM [id number]. Topic: Clinical - Medication Refill >> Jul 28, 2024  3:24 PM Alfonso HERO wrote: Medication: amphetamine -dextroamphetamine  (ADDERALL XR) 20 MG 24 hr capsule  amphetamine -dextroamphetamine  (ADDERALL) 10 MG tablet    Has the patient contacted their pharmacy? Yes (Agent: If no, request that the patient contact the pharmacy for the refill. If patient does not wish to contact the pharmacy document the reason why and proceed with request.) (Agent: If yes, when and what did the pharmacy advise?)  This is the patient's preferred pharmacy:  [id number] [place], [place] - [address] AT ACROSS FROM [facility] [address] [place] [address] Phone: [phone] Fax: [phone]  Is this the correct pharmacy for this prescription? Yes If no, delete pharmacy and type the correct one.   Has the prescription been filled recently? No  Is the patient out of the medication? Yes  Has the patient been seen for an appointment in the last year OR does the patient have an upcoming appointment? Yes  Can we respond through MyChart? Yes  Agent: Please be advised that Rx refills may take up to 3 business days. We ask that you follow-up with your pharmacy.

## 2024-08-02 ENCOUNTER — Other Ambulatory Visit: Payer: Self-pay | Admitting: Family Medicine

## 2024-08-09 ENCOUNTER — Other Ambulatory Visit: Payer: Self-pay | Admitting: Family

## 2024-08-09 ENCOUNTER — Telehealth: Payer: Self-pay | Admitting: Family Medicine

## 2024-08-09 MED ORDER — AMPHETAMINE-DEXTROAMPHET ER 20 MG PO CP24: 20.0000 mg | ORAL_CAPSULE | ORAL | 0 refills | Status: DC

## 2024-08-09 MED ORDER — AMPHETAMINE-DEXTROAMPHETAMINE 10 MG PO TABS: 10.0000 mg | ORAL_TABLET | Freq: Every day | ORAL | 0 refills | Status: DC | PRN

## 2024-08-09 NOTE — Telephone Encounter (Signed)
 Copied from CRM [id number]. Topic: Clinical - Medication Refill >> Jul 28, 2024  3:24 PM Alfonso HERO wrote: Medication: amphetamine -dextroamphetamine  (ADDERALL XR) 20 MG 24 hr capsule  amphetamine -dextroamphetamine  (ADDERALL) 10 MG tablet    Has the patient contacted their pharmacy? Yes (Agent: If no, request that the patient contact the pharmacy for the refill. If patient does not wish to contact the pharmacy document the reason why and proceed with request.) (Agent: If yes, when and what did the pharmacy advise?)  This is the patient's preferred pharmacy:  [id number] [place],  - [address] AT ACROSS FROM [facility] [address] [place] [address] Phone: [phone] Fax: [phone]  Is this the correct pharmacy for this prescription? Yes If no, delete pharmacy and type the correct one.   Has the prescription been filled recently? No  Is the patient out of the medication? Yes  Has the patient been seen for an appointment in the last year OR does the patient have an upcoming appointment? Yes  Can we respond through MyChart? Yes  Agent: Please be advised that Rx refills may take up to 3 business days. We ask that you follow-up with your pharmacy. >> Aug 09, 2024  3:44 PM Frederich PARAS wrote: Pt called today in regards to this fill. I did  not see any updated in regards to this crm.

## 2024-08-19 ENCOUNTER — Encounter: Payer: Self-pay | Admitting: Pharmacist

## 2024-08-19 ENCOUNTER — Other Ambulatory Visit: Payer: Self-pay | Admitting: Pharmacist

## 2024-08-19 MED ORDER — ROSUVASTATIN CALCIUM 40 MG PO TABS: 40.0000 mg | ORAL_TABLET | Freq: Every day | ORAL | 0 refills | Status: AC

## 2024-08-19 NOTE — Progress Notes (Signed)
 Pharmacy Quality Measure Review  This patient is appearing on a report for being at risk of failing the adherence measure for cholesterol (statin) medications this calendar year.   Medication: rosuvastatin   Last fill date: 04/13/2024 for 90 day supply  Reviewed recent refill history in Dr Annemarie database. Actual last refill date was 07/21/2024 for 90 day supply. Patient has no refills remaining. Next appointment with PCP is 11/24/2024.  She should pass med adherence for statin therapy for 2025.   Since patient will not have enough refills to last until her next PCP appt. Sent in Rx for rosuvastatin  for 90 DS and no refills.   Madelin Ray, PharmD Clinical Pharmacist [facility]  Population Health [phone]

## 2024-09-02 ENCOUNTER — Other Ambulatory Visit: Payer: Self-pay | Admitting: Family Medicine

## 2024-09-02 DIAGNOSIS — E782 Mixed hyperlipidemia: Secondary | ICD-10-CM

## 2024-09-02 DIAGNOSIS — M8580 Other specified disorders of bone density and structure, unspecified site: Secondary | ICD-10-CM

## 2024-09-02 DIAGNOSIS — Z23 Encounter for immunization: Secondary | ICD-10-CM

## 2024-09-02 DIAGNOSIS — R739 Hyperglycemia, unspecified: Secondary | ICD-10-CM

## 2024-09-02 DIAGNOSIS — R030 Elevated blood-pressure reading, without diagnosis of hypertension: Secondary | ICD-10-CM

## 2024-09-02 MED ORDER — ZOLPIDEM TARTRATE 10 MG PO TABS: 10.0000 mg | ORAL_TABLET | Freq: Every evening | ORAL | 1 refills | Status: AC | PRN

## 2024-09-02 MED ORDER — ALPRAZOLAM 1 MG PO TABS: ORAL_TABLET | ORAL | 0 refills | Status: DC

## 2024-09-02 NOTE — Telephone Encounter (Signed)
 Requesting: alprazolam  and Ambien  Contract: 07/20/24 UDS: ordered on 07/20/24- not done Last Visit: 07/20/24 w/ Wheeler Next Visit: 11/24/24 Last Refill: see med list  Please Advise

## 2024-09-02 NOTE — Telephone Encounter (Unsigned)
 Copied from CRM [id number]. Topic: Clinical - Medication Refill >> Sep 02, 2024  4:00 PM Paige D wrote: Medication: ALPRAZolam  (XANAX ) 1 MG tablet zolpidem  (AMBIEN ) 10 MG tablet  Has the patient contacted their pharmacy? No (Agent: If no, request that the patient contact the pharmacy for the refill. If patient does not wish to contact the pharmacy document the reason why and proceed with request.) (Agent: If yes, when and what did the pharmacy advise?)  This is the patient's preferred pharmacy:   [id number] - [place], [place] - [address] AT ACROSS FROM [facility] [address] [place] [address] Phone: [phone] Fax: [phone]  Is this the correct pharmacy for this prescription? Yes If no, delete pharmacy and type the correct one.   Has the prescription been filled recently? No  Is the patient out of the medication? Yes  Has the patient been seen for an appointment in the last year OR does the patient have an upcoming appointment? Yes  Can we respond through MyChart? No. Prefers call   Agent: Please be advised that Rx refills may take up to 3 business days. We ask that you follow-up with your pharmacy.

## 2024-09-16 ENCOUNTER — Encounter: Payer: Self-pay | Admitting: Family Medicine

## 2024-09-16 MED ORDER — TIRZEPATIDE-WEIGHT MANAGEMENT 2.5 MG/0.5ML ~~LOC~~ SOLN: 2.5000 mg | SUBCUTANEOUS | 1 refills | Status: DC

## 2024-09-20 ENCOUNTER — Other Ambulatory Visit: Payer: Self-pay | Admitting: Family Medicine

## 2024-09-20 MED ORDER — AMPHETAMINE-DEXTROAMPHETAMINE 10 MG PO TABS: 10.0000 mg | ORAL_TABLET | Freq: Every day | ORAL | 0 refills | Status: DC | PRN

## 2024-09-20 MED ORDER — AMPHETAMINE-DEXTROAMPHET ER 20 MG PO CP24: 20.0000 mg | ORAL_CAPSULE | ORAL | 0 refills | Status: DC

## 2024-09-20 NOTE — Telephone Encounter (Unsigned)
 Copied from CRM #[id number]. Topic: Clinical - Medication Refill >> Sep 20, 2024 10:05 AM China J wrote: Medication:  amphetamine -dextroamphetamine  (ADDERALL XR) 20 MG 24 hr capsule amphetamine -dextroamphetamine  (ADDERALL) 10 MG tablet  Has the patient contacted their pharmacy? Yes (Agent: If no, request that the patient contact the pharmacy for the refill. If patient does not wish to contact the pharmacy document the reason why and proceed with request.) (Agent: If yes, when and what did the pharmacy advise?)  This is the patient's preferred pharmacy:  [id number] - [place], [place] - [address] AT ACROSS FROM [address] [place] [address] Phone: [phone] Fax: [phone]  [facility] - [place], [place] - [address] [phone] [address] [place] [id number] Phone: [phone] Fax: [phone]  Is this the correct pharmacy for this prescription? Yes If no, delete pharmacy and type the correct one.   Has the prescription been filled recently? No  Is the patient out of the medication? No  Has the patient been seen for an appointment in the last year OR does the patient have an upcoming appointment? Yes  Can we respond through MyChart? Yes  Agent: Please be advised that Rx refills may take up to 3 business days. We ask that you follow-up with your pharmacy.

## 2024-09-30 ENCOUNTER — Telehealth: Payer: Self-pay | Admitting: Family Medicine

## 2024-09-30 NOTE — Telephone Encounter (Signed)
 Copied from CRM [id number]. Topic: Medicare AWV >> Sep 30, 2024 11:30 AM Nathanel DEL wrote: Called LVM 09/30/2024 to sched AWV. Please schedule in office or virtual visit.   Nathanel Paschal; Care Guide Ambulatory Clinical Support [place] [facility] Direct Dial: [phone]

## 2024-10-19 ENCOUNTER — Other Ambulatory Visit: Payer: Self-pay | Admitting: Family Medicine

## 2024-10-24 ENCOUNTER — Other Ambulatory Visit: Payer: Self-pay | Admitting: Family Medicine

## 2024-10-24 MED ORDER — AMPHETAMINE-DEXTROAMPHET ER 20 MG PO CP24: 20.0000 mg | ORAL_CAPSULE | ORAL | 0 refills | Status: DC

## 2024-10-24 MED ORDER — AMPHETAMINE-DEXTROAMPHETAMINE 10 MG PO TABS: 10.0000 mg | ORAL_TABLET | Freq: Every day | ORAL | 0 refills | Status: DC | PRN

## 2024-10-24 NOTE — Telephone Encounter (Signed)
 Requesting: Adderall XR 20mg , Adderall 10mg  Contract:10/06/23 UDS: 10/06/23 Last Visit: 07/20/24 w/ Harlene GRADE Next Visit: 11/24/24 Last Refill: see med list  Please Advise

## 2024-10-24 NOTE — Telephone Encounter (Unsigned)
 Copied from CRM [id number]. Topic: Clinical - Medication Refill >> Oct 24, 2024 12:08 PM Eva FALCON wrote: Medication:amphetamine -dextroamphetamine  (ADDERALL) 10 MG tablet  amphetamine -dextroamphetamine  (ADDERALL XR) 20 MG 24 hr capsule  Has the patient contacted their pharmacy? Yes, states they didn't have anything for her.  (Agent: If no, request that the patient contact the pharmacy for the refill. If patient does not wish to contact the pharmacy document the reason why and proceed with request.) (Agent: If yes, when and what did the pharmacy advise?)  This is the patient's preferred pharmacy:  [id number] - [place], [place] - [address] AT ACROSS FROM [facility] [address] [place] [address] Phone: [phone] Fax: [phone]  Is this the correct pharmacy for this prescription? Yes If no, delete pharmacy and type the correct one.   Has the prescription been filled recently? Yes  Is the patient out of the medication? No  Has the patient been seen for an appointment in the last year OR does the patient have an upcoming appointment? Yes  Can we respond through MyChart? Yes  Agent: Please be advised that Rx refills may take up to 3 business days. We ask that you follow-up with your pharmacy.

## 2024-11-17 ENCOUNTER — Other Ambulatory Visit: Payer: Self-pay | Admitting: Family Medicine

## 2024-11-23 NOTE — Assessment & Plan Note (Signed)
 On Levothyroxine, continue to monitor

## 2024-11-23 NOTE — Assessment & Plan Note (Signed)
 Encouraged to get adequate exercise, calcium and vitamin d intake

## 2024-11-23 NOTE — Assessment & Plan Note (Signed)
 Encouraged good sleep hygiene such as dark, quiet room. No blue/green glowing lights such as computer screens in bedroom. No alcohol or stimulants in evening. Cut down on caffeine as able. Regular exercise is helpful but not just prior to bed time.  She struggles with feeling achy at night. Once she gets up and gets moving the next day she is feeling some better. Zolpidem  prn

## 2024-11-23 NOTE — Assessment & Plan Note (Signed)
 hgba1c acceptable, minimize simple carbs. Increase exercise as tolerated. Continue current meds

## 2024-11-23 NOTE — Assessment & Plan Note (Signed)
 Tolerating statin, encouraged heart healthy diet, avoid trans fats, minimize simple carbs and saturated fats. Increase exercise as tolerated

## 2024-11-23 NOTE — Assessment & Plan Note (Signed)
"   Stable on current meds         "

## 2024-11-23 NOTE — Assessment & Plan Note (Signed)
 Patient encouraged to maintain heart healthy diet, regular exercise, adequate sleep. Consider daily probiotics. Take medications as prescribed. Labs ordered and reviewed. Given and reviewed copy of ACP documents from [facility] Secretary of [place] and encouraged to complete and return  Colonoscopy 2024 has now aged out [facility] June 2025 repeat every 1-2 years Pap will repeat today Dexa 2024 repeat every 2-5 years

## 2024-11-24 ENCOUNTER — Other Ambulatory Visit (HOSPITAL_COMMUNITY)
Admission: RE | Admit: 2024-11-24 | Discharge: 2024-11-24 | Disposition: A | Source: Ambulatory Visit | Attending: Family Medicine | Admitting: Family Medicine

## 2024-11-24 ENCOUNTER — Encounter: Payer: Self-pay | Admitting: Family Medicine

## 2024-11-24 ENCOUNTER — Ambulatory Visit: Admitting: Family Medicine

## 2024-11-24 VITALS — BP 122/82 | HR 97 | Temp 97.8°F | Resp 14 | Ht 62.0 in | Wt 161.6 lb

## 2024-11-24 DIAGNOSIS — R739 Hyperglycemia, unspecified: Secondary | ICD-10-CM

## 2024-11-24 DIAGNOSIS — R7989 Other specified abnormal findings of blood chemistry: Secondary | ICD-10-CM

## 2024-11-24 DIAGNOSIS — Z0000 Encounter for general adult medical examination without abnormal findings: Secondary | ICD-10-CM

## 2024-11-24 DIAGNOSIS — M8580 Other specified disorders of bone density and structure, unspecified site: Secondary | ICD-10-CM | POA: Diagnosis not present

## 2024-11-24 DIAGNOSIS — E039 Hypothyroidism, unspecified: Secondary | ICD-10-CM

## 2024-11-24 DIAGNOSIS — R030 Elevated blood-pressure reading, without diagnosis of hypertension: Secondary | ICD-10-CM | POA: Diagnosis not present

## 2024-11-24 DIAGNOSIS — G4700 Insomnia, unspecified: Secondary | ICD-10-CM | POA: Diagnosis not present

## 2024-11-24 DIAGNOSIS — E782 Mixed hyperlipidemia: Secondary | ICD-10-CM

## 2024-11-24 DIAGNOSIS — F418 Other specified anxiety disorders: Secondary | ICD-10-CM | POA: Diagnosis not present

## 2024-11-24 DIAGNOSIS — Z23 Encounter for immunization: Secondary | ICD-10-CM | POA: Diagnosis not present

## 2024-11-24 DIAGNOSIS — E1169 Type 2 diabetes mellitus with other specified complication: Secondary | ICD-10-CM

## 2024-11-24 DIAGNOSIS — E785 Hyperlipidemia, unspecified: Secondary | ICD-10-CM | POA: Diagnosis not present

## 2024-11-24 DIAGNOSIS — Z124 Encounter for screening for malignant neoplasm of cervix: Secondary | ICD-10-CM

## 2024-11-24 MED ORDER — ALPRAZOLAM 1 MG PO TABS: ORAL_TABLET | ORAL | Status: AC

## 2024-11-24 MED ORDER — AMPHETAMINE-DEXTROAMPHET ER 20 MG PO CP24: 20.0000 mg | ORAL_CAPSULE | ORAL | 0 refills | Status: AC

## 2024-11-24 MED ORDER — AMPHETAMINE-DEXTROAMPHETAMINE 10 MG PO TABS: 10.0000 mg | ORAL_TABLET | Freq: Every day | ORAL | 0 refills | Status: AC | PRN

## 2024-11-24 NOTE — Patient Instructions (Signed)
 CBT insomnia an APP written by the [employer] ([place])   Preventive Care 65 Years and Older, Female Preventive care refers to lifestyle choices and visits with your health care provider that can promote health and wellness. Preventive care visits are also called wellness exams. What can I expect for my preventive care visit? Counseling Your health care provider may ask you questions about your: Medical history, including: Past medical problems. Family medical history. Pregnancy and menstrual history. History of falls. Current health, including: Memory and ability to understand (cognition). Emotional well-being. Home life and relationship well-being. Sexual activity and sexual health. Lifestyle, including: Alcohol, nicotine or tobacco, and drug use. Access to firearms. Diet, exercise, and sleep habits. Work and work astronomer. Sunscreen use. Safety issues such as seatbelt and bike helmet use. Physical exam Your health care provider will check your: Height and weight. These may be used to calculate your BMI (body mass index). BMI is a measurement that tells if you are at a healthy weight. Waist circumference. This measures the distance around your waistline. This measurement also tells if you are at a healthy weight and may help predict your risk of certain diseases, such as type 2 diabetes and high blood pressure. Heart rate and blood pressure. Body temperature. Skin for abnormal spots. What immunizations do I need?  Vaccines are usually given at various ages, according to a schedule. Your health care provider will recommend vaccines for you based on your age, medical history, and lifestyle or other factors, such as travel or where you work. What tests do I need? Screening Your health care provider may recommend screening tests for certain conditions. This may include: Lipid and cholesterol levels. Hepatitis C test. Hepatitis B test. HIV (human immunodeficiency  virus) test. STI (sexually transmitted infection) testing, if you are at risk. Lung cancer screening. Colorectal cancer screening. Diabetes screening. This is done by checking your blood sugar (glucose) after you have not eaten for a while (fasting). Mammogram. Talk with your health care provider about how often you should have regular mammograms. BRCA-related cancer screening. This may be done if you have a family history of breast, ovarian, tubal, or peritoneal cancers. Bone density scan. This is done to screen for osteoporosis. Talk with your health care provider about your test results, treatment options, and if necessary, the need for more tests. Follow these instructions at home: Eating and drinking  Eat a diet that includes fresh fruits and vegetables, whole grains, lean protein, and low-fat dairy products. Limit your intake of foods with high amounts of sugar, saturated fats, and salt. Take vitamin and mineral supplements as recommended by your health care provider. Do not drink alcohol if your health care provider tells you not to drink. If you drink alcohol: Limit how much you have to 0-1 drink a day. Know how much alcohol is in your drink. In the [place] one drink equals one 12 oz bottle of beer (355 mL), one 5 oz glass of wine (148 mL), or one 1 oz glass of hard liquor (44 mL). Lifestyle Brush your teeth every morning and night with fluoride toothpaste. Floss one time each day. Exercise for at least 30 minutes 5 or more days each week. Do not use any products that contain nicotine or tobacco. These products include cigarettes, chewing tobacco, and vaping devices, such as e-cigarettes. If you need help quitting, ask your health care provider. Do not use drugs. If you are sexually active, practice safe sex. Use a condom or other form of  protection in order to prevent STIs. Take aspirin only as told by your health care provider. Make sure that you understand how much to take and what  form to take. Work with your health care provider to find out whether it is safe and beneficial for you to take aspirin daily. Ask your health care provider if you need to take a cholesterol-lowering medicine (statin). Find healthy ways to manage stress, such as: Meditation, yoga, or listening to music. Journaling. Talking to a trusted person. Spending time with friends and family. Minimize exposure to UV radiation to reduce your risk of skin cancer. Safety Always wear your seat belt while driving or riding in a vehicle. Do not drive: If you have been drinking alcohol. Do not ride with someone who has been drinking. When you are tired or distracted. While texting. If you have been using any mind-altering substances or drugs. Wear a helmet and other protective equipment during sports activities. If you have firearms in your house, make sure you follow all gun safety procedures. What's next? Visit your health care provider once a year for an annual wellness visit. Ask your health care provider how often you should have your eyes and teeth checked. Stay up to date on all vaccines. This information is not intended to replace advice given to you by your health care provider. Make sure you discuss any questions you have with your health care provider. Document Revised: 04/10/2021 Document Reviewed: 04/10/2021 Elsevier Patient Education  2024 [employer].

## 2024-11-25 ENCOUNTER — Encounter: Payer: Self-pay | Admitting: Family Medicine

## 2024-11-25 ENCOUNTER — Ambulatory Visit: Payer: Self-pay | Admitting: Family Medicine

## 2024-11-25 LAB — CBC WITH DIFFERENTIAL/PLATELET
Basophils Absolute: 0.1 10*3/uL (ref 0.0–0.1)
Basophils Relative: 1.4 % (ref 0.0–3.0)
Eosinophils Absolute: 0.1 10*3/uL (ref 0.0–0.7)
Eosinophils Relative: 1.2 % (ref 0.0–5.0)
HCT: 42 % (ref 36.0–46.0)
Hemoglobin: 14.4 g/dL (ref 12.0–15.0)
Lymphocytes Relative: 46.4 % — ABNORMAL HIGH (ref 12.0–46.0)
Lymphs Abs: 3.1 10*3/uL (ref 0.7–4.0)
MCHC: 34.3 g/dL (ref 30.0–36.0)
MCV: 85.4 fl (ref 78.0–100.0)
Monocytes Absolute: 0.3 10*3/uL (ref 0.1–1.0)
Monocytes Relative: 4.1 % (ref 3.0–12.0)
Neutro Abs: 3.2 10*3/uL (ref 1.4–7.7)
Neutrophils Relative %: 46.9 % (ref 43.0–77.0)
Platelets: 225 10*3/uL (ref 150.0–400.0)
RBC: 4.92 Mil/uL (ref 3.87–5.11)
RDW: 14 % (ref 11.5–15.5)
WBC: 6.7 10*3/uL (ref 4.0–10.5)

## 2024-11-25 LAB — COMPREHENSIVE METABOLIC PANEL WITH GFR
ALT: 41 U/L — ABNORMAL HIGH (ref 3–35)
AST: 41 U/L — ABNORMAL HIGH (ref 5–37)
Albumin: 5.1 g/dL (ref 3.5–5.2)
Alkaline Phosphatase: 54 U/L (ref 39–117)
BUN: 23 mg/dL (ref 6–23)
CO2: 30 meq/L (ref 19–32)
Calcium: 10.4 mg/dL (ref 8.4–10.5)
Chloride: 100 meq/L (ref 96–112)
Creatinine, Ser: 0.85 mg/dL (ref 0.40–1.20)
GFR: 67.45 mL/min
Glucose, Bld: 100 mg/dL — ABNORMAL HIGH (ref 70–99)
Potassium: 4.4 meq/L (ref 3.5–5.1)
Sodium: 140 meq/L (ref 135–145)
Total Bilirubin: 0.4 mg/dL (ref 0.2–1.2)
Total Protein: 7.7 g/dL (ref 6.0–8.3)

## 2024-11-25 LAB — VITAMIN B12: Vitamin B-12: 391 pg/mL (ref 211–911)

## 2024-11-25 LAB — LIPID PANEL
Cholesterol: 145 mg/dL (ref 28–200)
HDL: 46.5 mg/dL
LDL Cholesterol: 54 mg/dL (ref 10–99)
NonHDL: 98.46
Total CHOL/HDL Ratio: 3
Triglycerides: 224 mg/dL — ABNORMAL HIGH (ref 10.0–149.0)
VLDL: 44.8 mg/dL — ABNORMAL HIGH (ref 0.0–40.0)

## 2024-11-25 LAB — TSH: TSH: 2.19 u[IU]/mL (ref 0.35–5.50)

## 2024-11-25 LAB — HEMOGLOBIN A1C: Hgb A1c MFr Bld: 6 % (ref 4.6–6.5)

## 2024-11-25 NOTE — Progress Notes (Signed)
 Called pt and no answer left message to return call.

## 2024-11-29 LAB — CYTOLOGY - PAP
Comment: NEGATIVE
Diagnosis: UNDETERMINED — AB
High risk HPV: NEGATIVE

## 2025-04-26 ENCOUNTER — Encounter: Admitting: Student
